# Patient Record
Sex: Female | Born: 1970 | Race: White | Hispanic: No | Marital: Married | Smoking: Never smoker
Health system: Southern US, Community
[De-identification: ages and names within clinical notes are randomized; demographics above are authoritative.]

## PROBLEM LIST (undated history)

## (undated) DIAGNOSIS — T7840XA Allergy, unspecified, initial encounter: Secondary | ICD-10-CM

## (undated) DIAGNOSIS — D219 Benign neoplasm of connective and other soft tissue, unspecified: Secondary | ICD-10-CM

## (undated) DIAGNOSIS — R112 Nausea with vomiting, unspecified: Secondary | ICD-10-CM

## (undated) DIAGNOSIS — T4145XA Adverse effect of unspecified anesthetic, initial encounter: Secondary | ICD-10-CM

## (undated) DIAGNOSIS — N83209 Unspecified ovarian cyst, unspecified side: Secondary | ICD-10-CM

## (undated) DIAGNOSIS — Z9889 Other specified postprocedural states: Secondary | ICD-10-CM

## (undated) DIAGNOSIS — I10 Essential (primary) hypertension: Secondary | ICD-10-CM

## (undated) DIAGNOSIS — T8859XA Other complications of anesthesia, initial encounter: Secondary | ICD-10-CM

## (undated) DIAGNOSIS — K760 Fatty (change of) liver, not elsewhere classified: Secondary | ICD-10-CM

## (undated) DIAGNOSIS — Q7649 Other congenital malformations of spine, not associated with scoliosis: Secondary | ICD-10-CM

## (undated) DIAGNOSIS — K219 Gastro-esophageal reflux disease without esophagitis: Secondary | ICD-10-CM

## (undated) HISTORY — DX: Unspecified ovarian cyst, unspecified side: N83.209

## (undated) HISTORY — DX: Benign neoplasm of connective and other soft tissue, unspecified: D21.9

## (undated) HISTORY — DX: Other congenital malformations of spine, not associated with scoliosis: Q76.49

## (undated) HISTORY — PX: NASAL SINUS SURGERY: SHX719

## (undated) HISTORY — DX: Allergy, unspecified, initial encounter: T78.40XA

## (undated) HISTORY — PX: CERVICAL POLYPECTOMY: SHX88

## (undated) HISTORY — DX: Fatty (change of) liver, not elsewhere classified: K76.0

## (undated) HISTORY — PX: CHOLECYSTECTOMY: SHX55

## (undated) HISTORY — PX: SPINE SURGERY: SHX786

## (undated) HISTORY — PX: TONSILLECTOMY: SUR1361

## (undated) HISTORY — DX: Essential (primary) hypertension: I10

---
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
## 2005-06-13 ENCOUNTER — Emergency Department: Payer: Self-pay | Admitting: Unknown Physician Specialty

## 2008-10-23 ENCOUNTER — Ambulatory Visit: Payer: Self-pay | Admitting: Emergency Medicine

## 2010-02-26 ENCOUNTER — Ambulatory Visit: Payer: Self-pay | Admitting: Family Medicine

## 2010-06-24 ENCOUNTER — Ambulatory Visit: Payer: Self-pay | Admitting: Family Medicine

## 2011-03-11 ENCOUNTER — Ambulatory Visit: Payer: Self-pay | Admitting: Nephrology

## 2011-03-13 ENCOUNTER — Ambulatory Visit: Payer: Self-pay | Admitting: Nephrology

## 2011-10-09 ENCOUNTER — Ambulatory Visit: Payer: Self-pay

## 2011-10-14 LAB — HM PAP SMEAR: HM Pap smear: NEGATIVE

## 2013-03-03 ENCOUNTER — Ambulatory Visit: Payer: Self-pay

## 2013-03-08 ENCOUNTER — Ambulatory Visit: Payer: Self-pay | Admitting: Gastroenterology

## 2013-03-08 LAB — CBC WITH DIFFERENTIAL/PLATELET
Basophil #: 0.1 10*3/uL (ref 0.0–0.1)
Basophil %: 0.6 %
Eosinophil %: 1.4 %
Lymphocyte #: 2.5 10*3/uL (ref 1.0–3.6)
Lymphocyte %: 19.7 %
MCH: 29.2 pg (ref 26.0–34.0)
MCHC: 33.7 g/dL (ref 32.0–36.0)
MCV: 87 fL (ref 80–100)
Monocyte %: 6.8 %
RBC: 4.31 10*6/uL (ref 3.80–5.20)
RDW: 13.3 % (ref 11.5–14.5)
WBC: 12.5 10*3/uL — ABNORMAL HIGH (ref 3.6–11.0)

## 2013-03-08 LAB — HEPATIC FUNCTION PANEL A (ARMC)
Albumin: 3.8 g/dL (ref 3.4–5.0)
Alkaline Phosphatase: 107 U/L (ref 50–136)
Bilirubin, Direct: 0.1 mg/dL (ref 0.00–0.20)
Bilirubin,Total: 0.5 mg/dL (ref 0.2–1.0)
SGPT (ALT): 24 U/L (ref 12–78)
Total Protein: 7.8 g/dL (ref 6.4–8.2)

## 2013-03-08 LAB — CREATININE, SERUM
Creatinine: 0.99 mg/dL (ref 0.60–1.30)
EGFR (African American): >60

## 2013-03-09 ENCOUNTER — Emergency Department: Payer: Self-pay | Admitting: Emergency Medicine

## 2013-03-09 LAB — COMPREHENSIVE METABOLIC PANEL
Albumin: 3.7 g/dL (ref 3.4–5.0)
BUN: 10 mg/dL (ref 7–18)
Bilirubin,Total: 0.4 mg/dL (ref 0.2–1.0)
Chloride: 105 mmol/L (ref 98–107)
Creatinine: 0.92 mg/dL (ref 0.60–1.30)
EGFR (African American): >60
EGFR (Non-African Amer.): >60
Osmolality: 280 (ref 275–301)
Potassium: 3.7 mmol/L (ref 3.5–5.1)
SGOT(AST): 30 U/L (ref 15–37)
Sodium: 140 mmol/L (ref 136–145)
Total Protein: 7.2 g/dL (ref 6.4–8.2)

## 2013-03-09 LAB — CBC
HCT: 38.7 % (ref 35.0–47.0)
HGB: 13.1 g/dL (ref 12.0–16.0)
MCV: 88 fL (ref 80–100)
Platelet: 439 10*3/uL (ref 150–440)
RDW: 13 % (ref 11.5–14.5)

## 2013-03-09 LAB — URINALYSIS, COMPLETE
Bilirubin,UR: NEGATIVE
Glucose,UR: NEGATIVE mg/dL (ref 0–75)
Hyaline Cast: 3
Ketone: NEGATIVE
Protein: 30
RBC,UR: 5 /HPF (ref 0–5)
Specific Gravity: 1.021 (ref 1.003–1.030)
Squamous Epithelial: 12
WBC UR: 9 /HPF (ref 0–5)

## 2014-03-22 LAB — LIPID PANEL
Cholesterol: 174 mg/dL (ref 0–200)
HDL: 46 mg/dL (ref 35–70)
LDL Cholesterol: 97 mg/dL
Triglycerides: 153 mg/dL (ref 40–160)

## 2014-03-22 LAB — HEPATIC FUNCTION PANEL
ALT: 12 U/L (ref 7–35)
AST: 17 U/L (ref 13–35)
Alkaline Phosphatase: 92 U/L (ref 25–125)
Bilirubin, Total: 0.3 mg/dL

## 2014-03-22 LAB — CBC AND DIFFERENTIAL
HCT: 38 % (ref 36–46)
Hemoglobin: 12.3 g/dL (ref 12.0–16.0)
PLATELETS: 364 10*3/uL (ref 150–399)
WBC: 11.2 10^3/mL

## 2014-03-22 LAB — BASIC METABOLIC PANEL
BUN: 9 mg/dL (ref 4–21)
CREATININE: 0.9 mg/dL (ref 0.5–1.1)
Glucose: 89 mg/dL
Potassium: 4.8 mmol/L (ref 3.4–5.3)
Sodium: 135 mmol/L — AB (ref 137–147)

## 2014-03-22 LAB — TSH: TSH: 2.3 u[IU]/mL (ref 0.41–5.90)

## 2014-04-11 ENCOUNTER — Ambulatory Visit: Payer: Self-pay

## 2014-04-11 LAB — HM MAMMOGRAPHY

## 2015-03-09 ENCOUNTER — Encounter: Payer: Self-pay | Admitting: Family Medicine

## 2015-03-09 ENCOUNTER — Ambulatory Visit (INDEPENDENT_AMBULATORY_CARE_PROVIDER_SITE_OTHER): Payer: Self-pay | Admitting: Family Medicine

## 2015-03-09 VITALS — BP 110/71 | HR 56 | Temp 98.0°F | Ht 63.3 in | Wt 211.2 lb

## 2015-03-09 DIAGNOSIS — E669 Obesity, unspecified: Secondary | ICD-10-CM

## 2015-03-09 DIAGNOSIS — N951 Menopausal and female climacteric states: Secondary | ICD-10-CM

## 2015-03-09 DIAGNOSIS — I10 Essential (primary) hypertension: Secondary | ICD-10-CM

## 2015-03-09 MED ORDER — ATENOLOL 25 MG PO TABS: 25.0000 mg | ORAL_TABLET | Freq: Every day | ORAL | Status: DC

## 2015-03-09 MED ORDER — OLMESARTAN MEDOXOMIL 40 MG PO TABS: 40.0000 mg | ORAL_TABLET | Freq: Every day | ORAL | Status: DC

## 2015-03-09 MED ORDER — AMLODIPINE BESYLATE 5 MG PO TABS: 5.0000 mg | ORAL_TABLET | Freq: Every day | ORAL | Status: DC

## 2015-03-09 NOTE — Patient Instructions (Signed)

## 2015-03-09 NOTE — Assessment & Plan Note (Signed)
Continue great diet and exercise and weight loss! Doing a great job!

## 2015-03-09 NOTE — Assessment & Plan Note (Signed)
Doing great! Continue with weight loss and exercise. Continue current regimen at this time. Advised patient to monitor BP more at home and watch for dizziness or lightheadedness as we may be able to decrease her medication. Follow up for PE in 6 months.

## 2015-03-09 NOTE — Progress Notes (Signed)
BP 110/71 mmHg  Pulse 56  Temp(Src) 98 F (36.7 C)  Ht 5' 3.3" (1.608 m)  Wt 211 lb 3.2 oz (95.8 kg)  BMI 37.05 kg/m2  SpO2 98%  LMP 02/23/2015 (Approximate)   Subjective:    Patient ID: Victoria Bowers, female    DOB: [birth year], 44 y.o.   MRN: [id number]  HPI: Victoria Bowers is a 44 y.o. female presenting on 03/09/2015 for Hypertension  Kaylla has been doing really well. She has started doing [employer] and working with a Physiological scientist. She has lost 22lbs! She is feeling better, her headaches are gone. She is much happier. She does note that her period has become a bit more erratic, but she is still getting one monthly. She is otherwise feeling well with no other concerns or complaints today.   HYPERTENSION Hypertension status: controlled Satisfied with current treatment? no Duration of hypertension: chronic BP monitoring frequency:  not checking BP range:  BP medication side effects:  no Medication compliance: excellent compliance Previous BP meds: none Aspirin: no Recurrent headaches: no Visual changes: no Palpitations: no Dyspnea: no Chest pain: no Lower extremity edema: no Dizzy/lightheaded: no  Relevant past medical, surgical, family and social history reviewed and updated as indicated. Interim medical history since our last visit reviewed. Allergies and medications reviewed and updated.  Review of Systems  Constitutional: Negative.   Respiratory: Negative.   Cardiovascular: Negative.   Neurological: Negative.   Psychiatric/Behavioral: Negative.     Per HPI unless specifically indicated above     Objective:    BP 110/71 mmHg  Pulse 56  Temp(Src) 98 F (36.7 C)  Ht 5' 3.3" (1.608 m)  Wt 211 lb 3.2 oz (95.8 kg)  BMI 37.05 kg/m2  SpO2 98%  LMP 02/23/2015 (Approximate)  Wt Readings from Last 3 Encounters:  03/09/15 211 lb 3.2 oz (95.8 kg)  09/01/14 233 lb (105.688 kg)   Physical Exam  Constitutional: She appears well-developed  and well-nourished.  HENT:  Head: Normocephalic and atraumatic.  Eyes: Pupils are equal, round, and reactive to light.  Cardiovascular: Normal rate, regular rhythm and normal heart sounds.  Exam reveals no gallop and no friction rub.   No murmur heard. Pulmonary/Chest: Effort normal and breath sounds normal. No respiratory distress. She has no wheezes. She has no rales. She exhibits no tenderness.  Skin: Skin is warm and dry.  Psychiatric: She has a normal mood and affect. Her behavior is normal. Judgment and thought content normal.  Nursing note and vitals reviewed.       Assessment & Plan:   Problem List Items Addressed This Visit      Cardiovascular and Mediastinum   HTN (hypertension) - Primary    Doing great! Continue with weight loss and exercise. Continue current regimen at this time. Advised patient to monitor BP more at home and watch for dizziness or lightheadedness as we may be able to decrease her medication. Follow up for PE in 6 months.       Relevant Medications   atenolol (TENORMIN) 25 MG tablet   amLODipine (NORVASC) 5 MG tablet   olmesartan (BENICAR) 40 MG tablet   Other Relevant Orders   Basic Metabolic Panel (BMET)     Other   Obesity    Continue great diet and exercise and weight loss! Doing a great job!       Other Visit Diagnoses    Perimenopausal        Information given to patient  today regarding perimenopausal. Continue to monitor.         Follow up plan: Return in about 6 months (around 09/08/2015), or PE with CW.

## 2015-03-10 LAB — BASIC METABOLIC PANEL
BUN / CREAT RATIO: 11 (ref 9–23)
BUN: 10 mg/dL (ref 6–24)
CO2: 20 mmol/L (ref 18–29)
Calcium: 9.3 mg/dL (ref 8.7–10.2)
Chloride: 101 mmol/L (ref 97–108)
Creatinine, Ser: 0.93 mg/dL (ref 0.57–1.00)
GFR calc non Af Amer: 74 mL/min/{1.73_m2} (ref >59)
GFR, EST AFRICAN AMERICAN: 86 mL/min/{1.73_m2} (ref >59)
Glucose: 100 mg/dL — ABNORMAL HIGH (ref 65–99)
Potassium: 4.6 mmol/L (ref 3.5–5.2)
Sodium: 136 mmol/L (ref 134–144)

## 2015-07-09 ENCOUNTER — Telehealth: Payer: Self-pay | Admitting: Unknown Physician Specialty

## 2015-07-12 ENCOUNTER — Telehealth: Payer: Self-pay | Admitting: Unknown Physician Specialty

## 2015-07-12 NOTE — Telephone Encounter (Signed)
Pt needs a prior auth for benicar

## 2015-07-13 NOTE — Telephone Encounter (Signed)
I called and requested the paperwork for the beniciar and spoke with Malachy Mood and she stated to have the patient come in for a face to face appt regarding this particular medication. Colletta Maryland called and scheduled her for an appt next week'

## 2015-07-16 DIAGNOSIS — K760 Fatty (change of) liver, not elsewhere classified: Secondary | ICD-10-CM | POA: Insufficient documentation

## 2015-07-16 DIAGNOSIS — G43909 Migraine, unspecified, not intractable, without status migrainosus: Secondary | ICD-10-CM

## 2015-07-17 ENCOUNTER — Encounter: Payer: Self-pay | Admitting: Unknown Physician Specialty

## 2015-07-17 ENCOUNTER — Ambulatory Visit (INDEPENDENT_AMBULATORY_CARE_PROVIDER_SITE_OTHER): Payer: BLUE CROSS/BLUE SHIELD | Admitting: Unknown Physician Specialty

## 2015-07-17 VITALS — BP 141/82 | HR 67 | Temp 98.6°F | Ht 62.2 in | Wt 217.8 lb

## 2015-07-17 DIAGNOSIS — Z23 Encounter for immunization: Secondary | ICD-10-CM | POA: Diagnosis not present

## 2015-07-17 DIAGNOSIS — I10 Essential (primary) hypertension: Secondary | ICD-10-CM | POA: Diagnosis not present

## 2015-07-17 MED ORDER — OLMESARTAN MEDOXOMIL 40 MG PO TABS: 40.0000 mg | ORAL_TABLET | Freq: Every day | ORAL | Status: DC

## 2015-07-17 NOTE — Assessment & Plan Note (Addendum)
BP is stable.  Paperwork filled out and faxed.  Will do labs at f/u in December

## 2015-07-17 NOTE — Progress Notes (Signed)
   BP 141/82 mmHg  Pulse 67  Temp(Src) 98.6 F (37 C)  Ht 5' 2.2" (1.58 m)  Wt 217 lb 12.8 oz (98.793 kg)  BMI 39.57 kg/m2  SpO2 99%  LMP 06/26/2015 (Approximate)   Subjective:    Patient ID: Victoria Bowers, female    DOB: [birth year], 44 y.o.   MRN: [id number]  HPI: Victoria Bowers is a 44 y.o. female  Chief Complaint  Patient presents with  . Hypertension   Hypertension This is a chronic (Pt needs approval for Benicar.  She has been on Avelox 300 mg and Benazepril 40 mg without adequate response) problem. The problem is unchanged. The problem is controlled. Pertinent negatives include no chest pain, headaches, neck pain, palpitations, peripheral edema or shortness of breath. There are no compliance problems.    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 Relevant past medical, surgical, family and social history reviewed and updated as indicated. Interim medical history since our last visit reviewed. Allergies and medications reviewed and updated.  Review of Systems  Respiratory: Negative for shortness of breath.   Cardiovascular: Negative for chest pain and palpitations.  Musculoskeletal: Negative for neck pain.  Neurological: Negative for headaches.    Per HPI unless specifically indicated above     Objective:    BP 141/82 mmHg  Pulse 67  Temp(Src) 98.6 F (37 C)  Ht 5' 2.2" (1.58 m)  Wt 217 lb 12.8 oz (98.793 kg)  BMI 39.57 kg/m2  SpO2 99%  LMP 06/26/2015 (Approximate)  Wt Readings from Last 3 Encounters:  07/17/15 217 lb 12.8 oz (98.793 kg)  09/04/14 233 lb (105.688 kg)  03/09/15 211 lb 3.2 oz (95.8 kg)    Physical Exam  Constitutional: She is oriented to person, place, and time. She appears well-developed and well-nourished. No distress.  HENT:  Head: Normocephalic and atraumatic.  Eyes: Conjunctivae and lids are normal. Right eye exhibits no discharge. Left eye exhibits no discharge. No scleral icterus.  Cardiovascular: Normal rate, regular  rhythm and normal heart sounds.  Exam reveals no gallop and no friction rub.   No murmur heard. Pulmonary/Chest: Effort normal and breath sounds normal. No respiratory distress. She has no wheezes.  Abdominal: Normal appearance. There is no splenomegaly or hepatomegaly.  Musculoskeletal: Normal range of motion.  Neurological: She is alert and oriented to person, place, and time.  Skin: Skin is intact. No rash noted. No pallor.  Psychiatric: She has a normal mood and affect. Her behavior is normal. Judgment and thought content normal.     Assessment & Plan:   Problem List Items Addressed This Visit      Unprioritized   HTN (hypertension) - Primary    BP is stable.  Paperwork filled out and faxed.  Will do labs at f/u in December      Relevant Medications   olmesartan (BENICAR) 40 MG tablet   olmesartan (BENICAR) 40 MG tablet    Other Visit Diagnoses    Immunization due        Relevant Orders    Flu Vaccine QUAD 36+ mos PF IM (Fluarix & Fluzone Quad PF) (Completed)       Will do appropriate labs in December when patient comes in for her physical.  Rx for 7 days of Benicar and one for 30 days due to cost.    Follow up plan: Return in about 2 months (around 09/16/2015) for physical.

## 2015-07-20 NOTE — Telephone Encounter (Signed)
done

## 2015-07-24 ENCOUNTER — Encounter: Payer: Self-pay | Admitting: Unknown Physician Specialty

## 2015-07-25 ENCOUNTER — Other Ambulatory Visit: Payer: Self-pay | Admitting: Unknown Physician Specialty

## 2015-07-26 ENCOUNTER — Other Ambulatory Visit: Payer: Self-pay | Admitting: Unknown Physician Specialty

## 2015-07-27 NOTE — Telephone Encounter (Signed)
Will work on it.  Thanks . 

## 2015-08-20 ENCOUNTER — Other Ambulatory Visit: Payer: Self-pay

## 2015-08-20 MED ORDER — ATENOLOL 25 MG PO TABS: 25.0000 mg | ORAL_TABLET | Freq: Every day | ORAL | Status: DC

## 2015-08-20 NOTE — Telephone Encounter (Signed)
LAST VISIT: 07/17/2015 UPCOMING APPT: 09/10/2015  Refill and 90 day supply request for atenolol 25 mg tab.  [facility] [place]

## 2015-09-10 ENCOUNTER — Encounter: Payer: Self-pay | Admitting: Unknown Physician Specialty

## 2015-09-10 ENCOUNTER — Ambulatory Visit (INDEPENDENT_AMBULATORY_CARE_PROVIDER_SITE_OTHER): Payer: BLUE CROSS/BLUE SHIELD | Admitting: Unknown Physician Specialty

## 2015-09-10 VITALS — BP 127/85 | HR 60 | Temp 98.3°F | Ht 62.6 in | Wt 221.0 lb

## 2015-09-10 DIAGNOSIS — Z0000 Encounter for general adult medical examination without abnormal findings: Secondary | ICD-10-CM | POA: Diagnosis not present

## 2015-09-10 DIAGNOSIS — K760 Fatty (change of) liver, not elsewhere classified: Secondary | ICD-10-CM

## 2015-09-10 DIAGNOSIS — E669 Obesity, unspecified: Secondary | ICD-10-CM

## 2015-09-10 DIAGNOSIS — I10 Essential (primary) hypertension: Secondary | ICD-10-CM

## 2015-09-10 LAB — MICROSCOPIC EXAMINATION

## 2015-09-10 LAB — MICROALBUMIN, URINE WAIVED
CREATININE, URINE WAIVED: 50 mg/dL (ref 10–300)
MICROALB, UR WAIVED: 10 mg/L (ref 0–19)

## 2015-09-10 MED ORDER — OLMESARTAN MEDOXOMIL 40 MG PO TABS: 40.0000 mg | ORAL_TABLET | Freq: Every day | ORAL | Status: DC

## 2015-09-10 MED ORDER — ATENOLOL 25 MG PO TABS: 25.0000 mg | ORAL_TABLET | Freq: Every day | ORAL | Status: DC

## 2015-09-10 MED ORDER — AMLODIPINE BESYLATE 5 MG PO TABS: 5.0000 mg | ORAL_TABLET | Freq: Every day | ORAL | Status: DC

## 2015-09-10 NOTE — Assessment & Plan Note (Signed)
She is exercising and now wants to see a nutritionist

## 2015-09-10 NOTE — Progress Notes (Signed)
   BP 127/85 mmHg  Pulse 60  Temp(Src) 98.3 F (36.8 C)  Ht 5' 2.6" (1.59 m)  Wt 221 lb (100.245 kg)  BMI 39.65 kg/m2  SpO2 96%  LMP 09/06/2015 (Exact Date)   Subjective:    Patient ID: [name], female    DOB: [birth year], 44 y.o.   MRN: [id number]  HPI: Chasmine Neef is a 44 y.o. female  Chief Complaint  Patient presents with  . Annual Exam   Hypertension Using medications without difficulty Average home BPs 135/80's  No problems or lightheadedness No chest pain with exertion or shortness of breath No Edema  Obesity Pt is exercising doing karate.  She would like to see a nutritionist to learn better eating habits.  ------  Relevant past medical, surgical, family and social history reviewed and updated as indicated. Interim medical history since our last visit reviewed. Allergies and medications reviewed and updated.  Review of Systems  Per HPI unless specifically indicated above     Objective:    BP 127/85 mmHg  Pulse 60  Temp(Src) 98.3 F (36.8 C)  Ht 5' 2.6" (1.59 m)  Wt 221 lb (100.245 kg)  BMI 39.65 kg/m2  SpO2 96%  LMP 09/06/2015 (Exact Date)  Wt Readings from Last 3 Encounters:  09/10/15 221 lb (100.245 kg)  07/17/15 217 lb 12.8 oz (98.793 kg)  09/04/14 233 lb (105.688 kg)    Physical Exam  Results for orders placed or performed in visit on 07/16/15  HM MAMMOGRAPHY  Result Value Ref Range   HM Mammogram from [facility]       Assessment & Plan:   Problem List Items Addressed This Visit      Unprioritized   HTN (hypertension)   Relevant Orders   Comprehensive metabolic panel   Lipid Panel w/o Chol/HDL Ratio   Microalbumin, Urine Waived   Uric acid   Obesity - Primary    She is exercising and now wants to see a nutritionist      Relevant Orders   Ambulatory referral to Nutrition and Diabetic Education   Fatty liver   Relevant Orders   Lipid Panel w/o Chol/HDL Ratio    Other Visit Diagnoses    Routine general medical  examination at a health care facility        Relevant Orders    CBC with Differential/Platelet    Comprehensive metabolic panel    HIV antibody    TSH    Lipid Panel w/o Chol/HDL Ratio    UA/M w/rflx Culture, Routine    MM DIGITAL SCREENING BILATERAL    IGP, Aptima HPV, rfx 16/18,45        Follow up plan: No Follow-up on file.

## 2015-09-11 LAB — URIC ACID: URIC ACID: 3.9 mg/dL (ref 2.5–7.1)

## 2015-09-11 LAB — LIPID PANEL W/O CHOL/HDL RATIO
Cholesterol, Total: 181 mg/dL (ref 100–199)
HDL: 54 mg/dL (ref >39)
LDL CALC: 107 mg/dL — AB (ref 0–99)
Triglycerides: 100 mg/dL (ref 0–149)
VLDL Cholesterol Cal: 20 mg/dL (ref 5–40)

## 2015-09-11 LAB — CBC WITH DIFFERENTIAL/PLATELET
BASOS: 1 %
Basophils Absolute: 0 10*3/uL (ref 0.0–0.2)
EOS (ABSOLUTE): 0.2 10*3/uL (ref 0.0–0.4)
Eos: 3 %
HEMOGLOBIN: 12.6 g/dL (ref 11.1–15.9)
Hematocrit: 37 % (ref 34.0–46.6)
IMMATURE GRANS (ABS): 0 10*3/uL (ref 0.0–0.1)
Immature Granulocytes: 0 %
LYMPHS ABS: 2.2 10*3/uL (ref 0.7–3.1)
LYMPHS: 29 %
MCH: 30.1 pg (ref 26.6–33.0)
MCHC: 34.1 g/dL (ref 31.5–35.7)
MCV: 89 fL (ref 79–97)
MONOCYTES: 6 %
Monocytes Absolute: 0.5 10*3/uL (ref 0.1–0.9)
NEUTROS ABS: 4.6 10*3/uL (ref 1.4–7.0)
Neutrophils: 61 %
Platelets: 374 10*3/uL (ref 150–379)
RBC: 4.18 x10E6/uL (ref 3.77–5.28)
RDW: 12.8 % (ref 12.3–15.4)
WBC: 7.6 10*3/uL (ref 3.4–10.8)

## 2015-09-11 LAB — HIV ANTIBODY (ROUTINE TESTING W REFLEX): HIV SCREEN 4TH GENERATION: NONREACTIVE

## 2015-09-11 LAB — COMPREHENSIVE METABOLIC PANEL
A/G RATIO: 1.7 (ref 1.1–2.5)
ALBUMIN: 4 g/dL (ref 3.5–5.5)
ALT: 17 IU/L (ref 0–32)
AST: 22 IU/L (ref 0–40)
Alkaline Phosphatase: 78 IU/L (ref 39–117)
BILIRUBIN TOTAL: 0.3 mg/dL (ref 0.0–1.2)
BUN / CREAT RATIO: 19 (ref 9–23)
BUN: 15 mg/dL (ref 6–24)
CHLORIDE: 97 mmol/L (ref 96–106)
CO2: 22 mmol/L (ref 18–29)
Calcium: 9.1 mg/dL (ref 8.7–10.2)
Creatinine, Ser: 0.8 mg/dL (ref 0.57–1.00)
GFR calc non Af Amer: 89 mL/min/{1.73_m2} (ref >59)
GFR, EST AFRICAN AMERICAN: 102 mL/min/{1.73_m2} (ref >59)
Globulin, Total: 2.4 g/dL (ref 1.5–4.5)
Glucose: 101 mg/dL — ABNORMAL HIGH (ref 65–99)
POTASSIUM: 4.6 mmol/L (ref 3.5–5.2)
SODIUM: 132 mmol/L — AB (ref 134–144)
TOTAL PROTEIN: 6.4 g/dL (ref 6.0–8.5)

## 2015-09-11 LAB — UA/M W/RFLX CULTURE, ROUTINE

## 2015-09-11 LAB — TSH: TSH: 2.5 u[IU]/mL (ref 0.450–4.500)

## 2015-09-15 ENCOUNTER — Encounter: Payer: Self-pay | Admitting: Family Medicine

## 2015-09-15 LAB — IGP, APTIMA HPV, RFX 16/18,45
HPV APTIMA: NEGATIVE
PAP Smear Comment: 0

## 2015-09-26 ENCOUNTER — Encounter: Payer: BLUE CROSS/BLUE SHIELD | Attending: Unknown Physician Specialty | Admitting: Dietician

## 2015-09-26 VITALS — Ht 63.0 in | Wt 221.0 lb

## 2015-09-26 DIAGNOSIS — E669 Obesity, unspecified: Secondary | ICD-10-CM | POA: Diagnosis not present

## 2015-09-26 DIAGNOSIS — K760 Fatty (change of) liver, not elsewhere classified: Secondary | ICD-10-CM

## 2015-09-26 DIAGNOSIS — I10 Essential (primary) hypertension: Secondary | ICD-10-CM

## 2015-09-26 NOTE — Patient Instructions (Signed)
   Control portions of starchy foods, keep to 1/2 cup to no more than 1 cup per meal.   Eat out less often -- plan ahead to have healthy options at home.   Try having a small snack or light meal before karate on Tuesdays and Thursdays, then a healthy snack when you get home such as fruit and nuts or lowfat cheese, with milk or yogurt. Can also include a vegetable such as cucumbers, or carrot or celery.

## 2015-09-26 NOTE — Progress Notes (Signed)
Medical Nutrition Therapy: Visit start time: 1630  end time: 1730  Assessment:  Diagnosis: obesity Past medical history: HTN, fatty liver Psychosocial issues/ stress concerns: none Preferred learning method:  Victoria Bowers   Current weight: 221lbs with shoes and sweater  Height: 5'3" Medications, supplements: reviewed list in chart with patient  Progress and evaluation: Patient reports having lost about 20lbs in the past year while taking karate classes, now wants to work on diet for further weight loss.        She is ready for lifestyle change, does not want temporary diet.         She reports generally unhealthy eating habits as a child.         She tried weight watchers in the past for weight loss, with only short-term success. She attributes most weight gain to back injury, decreased mobility several years ago.   Physical activity: karate 1-2 hours 3 days per week  Dietary Intake:  Usual eating pattern includes 2-3 meals and 1-2 snacks per day. Dining out frequency: 8-11 meals per week.  Breakfast: usually light meal i.e. hash browns or tater tots. water Snack: chips provided by work, or goldfish, nuts  Lunch: often brings from home. Last week shredded beef, cheesy potatoes. Some lean cuisines Snack: sometimes, not regularly; similar to am Supper: leftovers, sometimes takeout [place] or other. Eats late on Tuesdays and Thursdays after karate (9:30-10pm) Snack: usually none Beverages: water, occasional soda at work, but does not keep in her home. Rarely Ginger ale.  Nutrition Care Education: Topics covered: weight control Basic nutrition: basic food groups, appropriate nutrient balance, appropriate meal and snack schedule, general nutrition guidelines    Weight control: behavioral changes for weight loss: portion control, tracking food intake, increasing vegetables and fruits; 1400kcal meal plan Hypertension:  identifying high sodium foods  Nutritional Diagnosis:  [place]-3.3  Overweight/obesity As related to history of restricted activity, excess caloric intake.  As evidenced by patient report, high BMI.  Intervention: Instruction as noted above.   Set goals with patient input.   Encouraged her to consider some form of tracking intake.    Education Materials given:  . Food lists/ Planning A Balanced Meal . Sample meal pattern/ menus: Quick and Healthy Meal Ideas . Snacking handout: Smart Snacking . Goals/ instructions   Learner/ who was taught:  . Patient   Level of understanding: Marland Kitchen Verbalizes/ demonstrates competency  Demonstrated degree of understanding via:   Teach back Learning barriers: . None   Willingness to learn/ readiness for change: . Eager, change in progress  Monitoring and Evaluation:  Dietary intake, exercise, and body weight      follow up: 10/24/15

## 2015-10-24 ENCOUNTER — Ambulatory Visit: Payer: BLUE CROSS/BLUE SHIELD | Admitting: Dietician

## 2015-11-02 ENCOUNTER — Encounter: Payer: BLUE CROSS/BLUE SHIELD | Attending: Unknown Physician Specialty | Admitting: Dietician

## 2015-11-02 VITALS — Ht 63.0 in | Wt 214.4 lb

## 2015-11-02 DIAGNOSIS — I10 Essential (primary) hypertension: Secondary | ICD-10-CM

## 2015-11-02 DIAGNOSIS — K760 Fatty (change of) liver, not elsewhere classified: Secondary | ICD-10-CM

## 2015-11-02 DIAGNOSIS — E669 Obesity, unspecified: Secondary | ICD-10-CM | POA: Diagnosis not present

## 2015-11-02 NOTE — Progress Notes (Signed)
Medical Nutrition Therapy: Visit start time: 1300  end time: 1330  Assessment:  Diagnosis: obesity Medical history changes: no changes Psychosocial issues/ stress concerns: none  Current weight: 214.4lbs  Height: 5'3" Medications, supplement changes: no changes Progress and evaluation: Patient reports eating smaller portions, has changed dinner schedule on karate-days, eating light meal before karate, then is not hungry when she arrives home.        She reports eating more vegetables and fruits but feels she still needs to increase.        She is eating out less often, packing lunches and snacks daily. Measuring snack portions.          She voices some challenge in making changes and adjusting initially, but feels the past 2 weeks have gone well.   Physical activity: karate 1-2 hours 3 times per week  Dietary Intake:  Usual eating pattern includes 3 meals and 1-2 snacks per day. Dining out frequency: not assess today.  Breakfast: carnation breakfast drink with banana and 1c. Milk and ice; or 1/2 bagel and fruit.   Snack: pre-portioned nuts, vanilla wafers, pretzels, or cheez-its.  Lunch: chicken/ leftovers from home, or lean cuisine or sandwich and salad such as fajita tortilla with lunch meat and [place] dressing or tsaziki sauce.   Limits meal to 3 carb servings.  Snack: sometimes, same options as am. Supper: chicken, tacos, vegetables such as broccoli, zucchini, salads.  Snack: none Beverages: water or milk. 0-1 soda weekly occasional hot tea.   Nutrition Care Education: Topics covered: weight management Weight control: increasing vegetable and fruit intake, options for whole grain foods such as cereals.  Nutritional Diagnosis:  [place]-3.3 Overweight/obesity As related to history of excess caloric intake and inactivity.  As evidenced by patient report.  Intervention: Discussion as noted above.   Commended patient for changes made.    Scheduled one more follow-up in about 6 weeks.    Education Materials given:  Marland Kitchen Ways to Increase Vegetables and Fruits . How to Eat More Beans . Build a Pyramid [employer] . Goals/ instructions  Learner/ who was taught:  . Patient   Level of understanding: Marland Kitchen Verbalizes/ demonstrates competency  Demonstrated degree of understanding via:   Teach back Learning barriers: . None  Willingness to learn/ readiness for change: . Eager, change in progress  Monitoring and Evaluation:  Dietary intake, exercise, and body weight      follow up: 12/19/15

## 2015-11-02 NOTE — Patient Instructions (Signed)
   Keep up your great healthy changes to control food portions and make good choices.   Eat generous portions of vegetables, and find ways to add them into foods like pasta to stretch portions of starches or meats.

## 2015-11-19 ENCOUNTER — Other Ambulatory Visit: Payer: Self-pay | Admitting: Family Medicine

## 2015-11-19 NOTE — Telephone Encounter (Signed)
Your patient 

## 2015-11-28 ENCOUNTER — Encounter: Payer: Self-pay | Admitting: Unknown Physician Specialty

## 2015-11-28 ENCOUNTER — Ambulatory Visit (INDEPENDENT_AMBULATORY_CARE_PROVIDER_SITE_OTHER): Payer: BLUE CROSS/BLUE SHIELD | Admitting: Unknown Physician Specialty

## 2015-11-28 VITALS — BP 138/82 | HR 55 | Temp 98.4°F | Ht 62.0 in | Wt 217.8 lb

## 2015-11-28 DIAGNOSIS — M659 Synovitis and tenosynovitis, unspecified: Secondary | ICD-10-CM

## 2015-11-28 DIAGNOSIS — M778 Other enthesopathies, not elsewhere classified: Secondary | ICD-10-CM

## 2015-11-28 MED ORDER — DICLOFENAC SODIUM 1 % TD GEL: 4.0000 g | Freq: Four times a day (QID) | TRANSDERMAL | Status: DC

## 2015-11-28 NOTE — Assessment & Plan Note (Signed)
Use tennis elbow brace.  Avoid repetitive bending or twisting motions.  Rx given for Voltaren gel.

## 2015-11-28 NOTE — Progress Notes (Signed)
BP 138/82 mmHg  Pulse 55  Temp(Src) 98.4 F (36.9 C)  Ht 5\' 2"  (1.575 m)  Wt 217 lb 12.8 oz (98.793 kg)  BMI 39.83 kg/m2  SpO2 99%  LMP 11/20/2015 (Exact Date)   Subjective:    Patient ID: Victoria Bowers, female    DOB: [birth year], 45 y.o.   MRN: [id number]  HPI: Victoria Bowers is a 44 y.o. female  Chief Complaint  Patient presents with  . Elbow Pain    pt states she thinks she has tennis elbow in her left elbow. States it has been hurting for a while but has hurt very bad within the last week.    Tendonitis of left elbow Painful in the last week.  Has been doing karate and getting tested for a new belt class.  She is taking 1200 mg's of Ibuprofen and wearing a compression brace.  She has not had any improvement from above treatments.    Relevant past medical, surgical, family and social history reviewed and updated as indicated. Interim medical history since our last visit reviewed. Allergies and medications reviewed and updated.  Review of Systems  Per HPI unless specifically indicated above     Objective:    BP 138/82 mmHg  Pulse 55  Temp(Src) 98.4 F (36.9 C)  Ht 5\' 2"  (1.575 m)  Wt 217 lb 12.8 oz (98.793 kg)  BMI 39.83 kg/m2  SpO2 99%  LMP 11/20/2015 (Exact Date)  Wt Readings from Last 3 Encounters:  11/28/15 217 lb 12.8 oz (98.793 kg)  11/02/15 214 lb 6.4 oz (97.251 kg)  09/26/15 221 lb (100.245 kg)    Physical Exam  Constitutional: She is oriented to person, place, and time. She appears well-developed and well-nourished. No distress.  HENT:  Head: Normocephalic and atraumatic.  Eyes: Conjunctivae and lids are normal. Right eye exhibits no discharge. Left eye exhibits no discharge. No scleral icterus.  Cardiovascular: Normal rate.   Pulmonary/Chest: Effort normal.  Abdominal: Normal appearance. There is no splenomegaly or hepatomegaly.  Musculoskeletal: Normal range of motion.       Left elbow: She exhibits normal range of motion, no  swelling, no effusion, no deformity and no laceration. Tenderness found. Lateral epicondyle tenderness noted. No medial epicondyle and no olecranon process tenderness noted.  Neurological: She is alert and oriented to person, place, and time.  Skin: Skin is intact. No rash noted. No pallor.  Psychiatric: She has a normal mood and affect. Her behavior is normal. Judgment and thought content normal.    Results for orders placed or performed in visit on 09/10/15  Microscopic Examination  Result Value Ref Range   WBC, UA 0-5 0 -  5 /hpf   RBC, UA 3-10 (A) 0 -  2 /hpf   Epithelial Cells (non renal) 0-10 0 - 10 /hpf   Bacteria, UA Few None seen/Few  CBC with Differential/Platelet  Result Value Ref Range   WBC 7.6 3.4 - 10.8 x10E3/uL   RBC 4.18 3.77 - 5.28 x10E6/uL   Hemoglobin 12.6 11.1 - 15.9 g/dL   Hematocrit 37.0 34.0 - 46.6 %   MCV 89 79 - 97 fL   MCH 30.1 26.6 - 33.0 pg   MCHC 34.1 31.5 - 35.7 g/dL   RDW 12.8 12.3 - 15.4 %   Platelets 374 150 - 379 x10E3/uL   Neutrophils 61 %   Lymphs 29 %   Monocytes 6 %   Eos 3 %   Basos 1 %  Neutrophils Absolute 4.6 1.4 - 7.0 x10E3/uL   Lymphocytes Absolute 2.2 0.7 - 3.1 x10E3/uL   Monocytes Absolute 0.5 0.1 - 0.9 x10E3/uL   EOS (ABSOLUTE) 0.2 0.0 - 0.4 x10E3/uL   Basophils Absolute 0.0 0.0 - 0.2 x10E3/uL   Immature Granulocytes 0 %   Immature Grans (Abs) 0.0 0.0 - 0.1 x10E3/uL  Comprehensive metabolic panel  Result Value Ref Range   Glucose 101 (H) 65 - 99 mg/dL   BUN 15 6 - 24 mg/dL   Creatinine, Ser 0.80 0.57 - 1.00 mg/dL   GFR calc non Af Amer 89 >59 mL/min/1.73   GFR calc Af Amer 102 >59 mL/min/1.73   BUN/Creatinine Ratio 19 9 - 23   Sodium 132 (L) 134 - 144 mmol/L   Potassium 4.6 3.5 - 5.2 mmol/L   Chloride 97 96 - 106 mmol/L   CO2 22 18 - 29 mmol/L   Calcium 9.1 8.7 - 10.2 mg/dL   Total Protein 6.4 6.0 - 8.5 g/dL   Albumin 4.0 3.5 - 5.5 g/dL   Globulin, Total 2.4 1.5 - 4.5 g/dL   Albumin/Globulin Ratio 1.7 1.1 - 2.5    Bilirubin Total 0.3 0.0 - 1.2 mg/dL   Alkaline Phosphatase 78 39 - 117 IU/L   AST 22 0 - 40 IU/L   ALT 17 0 - 32 IU/L  HIV antibody  Result Value Ref Range   HIV Screen 4th Generation wRfx Non Reactive Non Reactive  TSH  Result Value Ref Range   TSH 2.500 0.450 - 4.500 uIU/mL  Lipid Panel w/o Chol/HDL Ratio  Result Value Ref Range   Cholesterol, Total 181 100 - 199 mg/dL   Triglycerides 100 0 - 149 mg/dL   HDL 54 >39 mg/dL   VLDL Cholesterol Cal 20 5 - 40 mg/dL   LDL Calculated 107 (H) 0 - 99 mg/dL  UA/M w/rflx Culture, Routine  Result Value Ref Range   Urine Culture, Routine Final report    Urine Culture result 1 Comment   Microalbumin, Urine Waived  Result Value Ref Range   Microalb, Ur Waived 10 0 - 19 mg/L   Creatinine, Urine Waived 50 10 - 300 mg/dL   Microalb/Creat Ratio 30-300 (H) <30 mg/g  Uric acid  Result Value Ref Range   Uric Acid 3.9 2.5 - 7.1 mg/dL  IGP, Aptima HPV, rfx 16/18,45  Result Value Ref Range   DIAGNOSIS: Comment    Specimen adequacy: Comment    CLINICIAN PROVIDED ICD10: Comment    Performed by: Comment    PAP SMEAR COMMENT .    Note: Comment    Test Methodology Comment    HPV Aptima Negative Negative      Assessment & Plan:   Problem List Items Addressed This Visit      Unprioritized   Left elbow tendonitis - Primary    Use tennis elbow brace.  Avoid repetitive bending or twisting motions.  Rx given for Voltaren gel.          Follow up plan: Return if symptoms worsen or fail to improve.

## 2015-12-18 ENCOUNTER — Other Ambulatory Visit: Payer: Self-pay | Admitting: Unknown Physician Specialty

## 2015-12-18 NOTE — Telephone Encounter (Signed)
Your patient 

## 2015-12-19 ENCOUNTER — Ambulatory Visit: Payer: BLUE CROSS/BLUE SHIELD | Admitting: Dietician

## 2016-01-23 ENCOUNTER — Encounter: Payer: Self-pay | Admitting: Family Medicine

## 2016-01-23 ENCOUNTER — Ambulatory Visit (INDEPENDENT_AMBULATORY_CARE_PROVIDER_SITE_OTHER): Payer: BLUE CROSS/BLUE SHIELD | Admitting: Family Medicine

## 2016-01-23 VITALS — BP 123/82 | HR 59 | Temp 99.2°F | Wt 220.0 lb

## 2016-01-23 DIAGNOSIS — K122 Cellulitis and abscess of mouth: Secondary | ICD-10-CM | POA: Diagnosis not present

## 2016-01-23 MED ORDER — AMOXICILLIN-POT CLAVULANATE 875-125 MG PO TABS: 1.0000 | ORAL_TABLET | Freq: Two times a day (BID) | ORAL | Status: DC

## 2016-01-23 MED ORDER — MAGIC MOUTHWASH W/LIDOCAINE: 5.0000 mL | Freq: Four times a day (QID) | ORAL | Status: DC

## 2016-01-23 NOTE — Progress Notes (Signed)
BP 123/82 mmHg  Pulse 59  Temp(Src) 99.2 F (37.3 C)  Wt 220 lb (99.791 kg)  SpO2 98%   Subjective:    Patient ID: Victoria Bowers, female    DOB: 1971-02-13, 45 y.o.   MRN: [id number]  HPI: Victoria Bowers is a 45 y.o. female  Chief Complaint  Patient presents with  . Sore Throat    Patient states that she has a lump under her tongue, and that it hurts to swallow.   LUMP Duration: couple of days Location: suddenly Onset: sudden Painful: yes Discomfort: yes Status:  not changing Trauma: no Redness: no Bruising: no Recent infection: yes Swollen lymph nodes: no History of cancer: no Family history of cancer: no History of the same: no Associated signs and symptoms: trouble swallowing and tongue feels big  Relevant past medical, surgical, family and social history reviewed and updated as indicated. Interim medical history since our last visit reviewed. Allergies and medications reviewed and updated.  Review of Systems  Constitutional: Negative.   HENT: Positive for dental problem, drooling and mouth sores. Negative for congestion, ear discharge, ear pain, facial swelling, hearing loss, nosebleeds, postnasal drip, rhinorrhea, sinus pressure, sneezing, sore throat, tinnitus, trouble swallowing and voice change.   Respiratory: Negative.   Cardiovascular: Negative.   Psychiatric/Behavioral: Negative.     Per HPI unless specifically indicated above     Objective:    BP 123/82 mmHg  Pulse 59  Temp(Src) 99.2 F (37.3 C)  Wt 220 lb (99.791 kg)  SpO2 98%  Wt Readings from Last 3 Encounters:  01/23/16 220 lb (99.791 kg)  11/28/15 217 lb 12.8 oz (98.793 kg)  11/02/15 214 lb 6.4 oz (97.251 kg)    Physical Exam  Constitutional: She is oriented to person, place, and time. She appears well-developed and well-nourished. No distress.  HENT:  Head: Normocephalic and atraumatic.  Right Ear: Hearing and external ear normal.  Left Ear: Hearing and external ear  normal.  Nose: Nose normal.  Mouth/Throat: Uvula is midline, oropharynx is clear and moist and mucous membranes are normal. No oropharyngeal exudate.    Eyes: Conjunctivae, EOM and lids are normal. Pupils are equal, round, and reactive to light. Right eye exhibits no discharge. Left eye exhibits no discharge. No scleral icterus.  Neck: Normal range of motion. Neck supple. No JVD present. No tracheal deviation present. No thyromegaly present.  Cardiovascular: Normal rate, regular rhythm, normal heart sounds and intact distal pulses.  Exam reveals no gallop and no friction rub.   No murmur heard. Pulmonary/Chest: Effort normal and breath sounds normal. No stridor. No respiratory distress. She has no wheezes. She has no rales. She exhibits no tenderness.  Musculoskeletal: Normal range of motion.  Lymphadenopathy:    She has no cervical adenopathy.  Neurological: She is alert and oriented to person, place, and time.  Skin: Skin is warm, dry and intact. No rash noted. She is not diaphoretic. No erythema. No pallor.  Psychiatric: She has a normal mood and affect. Her speech is normal and behavior is normal. Judgment and thought content normal. Cognition and memory are normal.  Nursing note and vitals reviewed.   Results for orders placed or performed in visit on 09/10/15  Microscopic Examination  Result Value Ref Range   WBC, UA 0-5 0 -  5 /hpf   RBC, UA 3-10 (A) 0 -  2 /hpf   Epithelial Cells (non renal) 0-10 0 - 10 /hpf   Bacteria, UA Few None seen/Few  CBC with Differential/Platelet  Result Value Ref Range   WBC 7.6 3.4 - 10.8 x10E3/uL   RBC 4.18 3.77 - 5.28 x10E6/uL   Hemoglobin 12.6 11.1 - 15.9 g/dL   Hematocrit 37.0 34.0 - 46.6 %   MCV 89 79 - 97 fL   MCH 30.1 26.6 - 33.0 pg   MCHC 34.1 31.5 - 35.7 g/dL   RDW 12.8 12.3 - 15.4 %   Platelets 374 150 - 379 x10E3/uL   Neutrophils 61 %   Lymphs 29 %   Monocytes 6 %   Eos 3 %   Basos 1 %   Neutrophils Absolute 4.6 1.4 - 7.0  x10E3/uL   Lymphocytes Absolute 2.2 0.7 - 3.1 x10E3/uL   Monocytes Absolute 0.5 0.1 - 0.9 x10E3/uL   EOS (ABSOLUTE) 0.2 0.0 - 0.4 x10E3/uL   Basophils Absolute 0.0 0.0 - 0.2 x10E3/uL   Immature Granulocytes 0 %   Immature Grans (Abs) 0.0 0.0 - 0.1 x10E3/uL  Comprehensive metabolic panel  Result Value Ref Range   Glucose 101 (H) 65 - 99 mg/dL   BUN 15 6 - 24 mg/dL   Creatinine, Ser 0.80 0.57 - 1.00 mg/dL   GFR calc non Af Amer 89 >59 mL/min/1.73   GFR calc Af Amer 102 >59 mL/min/1.73   BUN/Creatinine Ratio 19 9 - 23   Sodium 132 (L) 134 - 144 mmol/L   Potassium 4.6 3.5 - 5.2 mmol/L   Chloride 97 96 - 106 mmol/L   CO2 22 18 - 29 mmol/L   Calcium 9.1 8.7 - 10.2 mg/dL   Total Protein 6.4 6.0 - 8.5 g/dL   Albumin 4.0 3.5 - 5.5 g/dL   Globulin, Total 2.4 1.5 - 4.5 g/dL   Albumin/Globulin Ratio 1.7 1.1 - 2.5   Bilirubin Total 0.3 0.0 - 1.2 mg/dL   Alkaline Phosphatase 78 39 - 117 IU/L   AST 22 0 - 40 IU/L   ALT 17 0 - 32 IU/L  HIV antibody  Result Value Ref Range   HIV Screen 4th Generation wRfx Non Reactive Non Reactive  TSH  Result Value Ref Range   TSH 2.500 0.450 - 4.500 uIU/mL  Lipid Panel w/o Chol/HDL Ratio  Result Value Ref Range   Cholesterol, Total 181 100 - 199 mg/dL   Triglycerides 100 0 - 149 mg/dL   HDL 54 >39 mg/dL   VLDL Cholesterol Cal 20 5 - 40 mg/dL   LDL Calculated 107 (H) 0 - 99 mg/dL  UA/M w/rflx Culture, Routine  Result Value Ref Range   Urine Culture, Routine Final report    Urine Culture result 1 Comment   Microalbumin, Urine Waived  Result Value Ref Range   Microalb, Ur Waived 10 0 - 19 mg/L   Creatinine, Urine Waived 50 10 - 300 mg/dL   Microalb/Creat Ratio 30-300 (H) <30 mg/g  Uric acid  Result Value Ref Range   Uric Acid 3.9 2.5 - 7.1 mg/dL  IGP, Aptima HPV, rfx 16/18,45  Result Value Ref Range   DIAGNOSIS: Comment    Specimen adequacy: Comment    CLINICIAN PROVIDED ICD10: Comment    Performed by: Comment    PAP SMEAR COMMENT .    Note:  Comment    Test Methodology Comment    HPV Aptima Negative Negative      Assessment & Plan:   Problem List Items Addressed This Visit    None    Visit Diagnoses    Infection of mouth    -  Primary    Will start on augmentin and magic mouthwash. To go to ER if feels like she can't breathe. Follow up in 1 week.     Relevant Medications    magic mouthwash w/lidocaine SOLN        Follow up plan: Return in about 1 week (around 01/30/2016).

## 2016-01-30 ENCOUNTER — Ambulatory Visit: Payer: BLUE CROSS/BLUE SHIELD | Admitting: Dietician

## 2016-01-30 ENCOUNTER — Ambulatory Visit (INDEPENDENT_AMBULATORY_CARE_PROVIDER_SITE_OTHER): Payer: BLUE CROSS/BLUE SHIELD | Admitting: Unknown Physician Specialty

## 2016-01-30 ENCOUNTER — Encounter: Payer: Self-pay | Admitting: Unknown Physician Specialty

## 2016-01-30 VITALS — BP 137/79 | HR 69 | Temp 98.5°F | Ht 62.3 in | Wt 220.0 lb

## 2016-01-30 DIAGNOSIS — J029 Acute pharyngitis, unspecified: Secondary | ICD-10-CM

## 2016-01-30 DIAGNOSIS — K1379 Other lesions of oral mucosa: Secondary | ICD-10-CM | POA: Diagnosis not present

## 2016-01-30 MED ORDER — AMOXICILLIN-POT CLAVULANATE 875-125 MG PO TABS: 1.0000 | ORAL_TABLET | Freq: Two times a day (BID) | ORAL | Status: DC

## 2016-01-30 NOTE — Progress Notes (Signed)
BP 137/79 mmHg  Pulse 69  Temp(Src) 98.5 F (36.9 C)  Ht 5' 2.3" (1.582 m)  Wt 220 lb (99.791 kg)  BMI 39.87 kg/m2  SpO2 97%   Subjective:    Patient ID: Victoria Bowers, female    DOB: 07/29/1971, 45 y.o.   MRN: [id number]  HPI: Victoria Bowers is a 45 y.o. female  Chief Complaint  Patient presents with  . Edema    pt is here for 1 week f/u. states she stopped the mouth wash and throat still has lumps and swelling   Pt is here for f/u of a mouth infection treated 1 week ago and given Augmentin and magic mouthwash.  Noted severe  pain under her tongue with lumps.  It is better with antibiotics.  But she continues being tired and reports "freezing" at work.  States she went to the dentist 1 week ago.    Relevant past medical, surgical, family and social history reviewed and updated as indicated. Interim medical history since our last visit reviewed. Allergies and medications reviewed and updated.  Review of Systems  Per HPI unless specifically indicated above     Objective:    BP 137/79 mmHg  Pulse 69  Temp(Src) 98.5 F (36.9 C)  Ht 5' 2.3" (1.582 m)  Wt 220 lb (99.791 kg)  BMI 39.87 kg/m2  SpO2 97%  Wt Readings from Last 3 Encounters:  01/30/16 220 lb (99.791 kg)  01/23/16 220 lb (99.791 kg)  11/28/15 217 lb 12.8 oz (98.793 kg)    Physical Exam  Constitutional: She is oriented to person, place, and time. She appears well-developed and well-nourished. No distress.  HENT:  Head: Normocephalic and atraumatic.  Right Ear: Tympanic membrane, external ear and ear canal normal.  Left Ear: Tympanic membrane, external ear and ear canal normal.  Nose: Nose normal.  Mouth/Throat: Uvula is midline. No oropharyngeal exudate, posterior oropharyngeal edema, posterior oropharyngeal erythema or tonsillar abscesses.  Eyes: Conjunctivae and lids are normal. Right eye exhibits no discharge. Left eye exhibits no discharge. No scleral icterus.  Neck: Normal range of  motion. Neck supple. No JVD present. Carotid bruit is not present.  Cardiovascular: Normal rate, regular rhythm and normal heart sounds.   Pulmonary/Chest: Effort normal and breath sounds normal.  Abdominal: Normal appearance. There is no splenomegaly or hepatomegaly.  Musculoskeletal: Normal range of motion.  Neurological: She is alert and oriented to person, place, and time.  Skin: Skin is warm, dry and intact. No rash noted. No pallor.  Psychiatric: She has a normal mood and affect. Her behavior is normal. Judgment and thought content normal.   WBC is normal  Assessment & Plan:   Problem List Items Addressed This Visit    None    Visit Diagnoses    Pharyngitis    -  Primary    Relevant Orders    CBC With Differential/Platelet    Mononucleosis screen    Mouth pain        It seems like she is getting better either on her own or help from antibiotics.  I would like her to finish antibiotics and let us know if problem is persistant       If this is persistent will plan to put her back on antibiotics and refer to ENT- sees Dr. Harrington Challenger at [facility].  Await mono spot.  Written rx given for the weekend.    Follow up plan: Return if symptoms worsen or fail to improve.

## 2016-01-31 LAB — CBC WITH DIFFERENTIAL/PLATELET
Hematocrit: 36.1 % (ref 34.0–46.6)
Hemoglobin: 12.6 g/dL (ref 11.1–15.9)
LYMPHS ABS: 3.1 10*3/uL (ref 0.7–3.1)
Lymphs: 34 %
MCH: 31.5 pg (ref 26.6–33.0)
MCHC: 34.9 g/dL (ref 31.5–35.7)
MCV: 90 fL (ref 79–97)
MID (Absolute): 0.8 10*3/uL (ref 0.1–1.6)
MID: 9 %
NEUTROS ABS: 5.1 10*3/uL (ref 1.4–7.0)
Neutrophils: 57 %
Platelets: 348 10*3/uL (ref 150–379)
RBC: 4 x10E6/uL (ref 3.77–5.28)
RDW: 12.8 % (ref 12.3–15.4)
WBC: 9 10*3/uL (ref 3.4–10.8)

## 2016-01-31 LAB — MONONUCLEOSIS SCREEN: Mono Screen: NEGATIVE

## 2016-02-06 ENCOUNTER — Encounter: Payer: Self-pay | Admitting: Dietician

## 2016-02-06 NOTE — Progress Notes (Signed)
Have not heard from patient to reschedule after missing 2 consecutive appointments. Sent discharge letter to MD.

## 2016-02-24 ENCOUNTER — Emergency Department: Payer: BLUE CROSS/BLUE SHIELD

## 2016-02-24 ENCOUNTER — Emergency Department
Admission: EM | Admit: 2016-02-24 | Discharge: 2016-02-24 | Disposition: A | Discharge status: Home / Self Care | Payer: BLUE CROSS/BLUE SHIELD | Attending: Emergency Medicine | Admitting: Emergency Medicine

## 2016-02-24 DIAGNOSIS — I10 Essential (primary) hypertension: Secondary | ICD-10-CM | POA: Insufficient documentation

## 2016-02-24 DIAGNOSIS — R0789 Other chest pain: Secondary | ICD-10-CM | POA: Diagnosis present

## 2016-02-24 DIAGNOSIS — R0781 Pleurodynia: Secondary | ICD-10-CM | POA: Insufficient documentation

## 2016-02-24 DIAGNOSIS — Z79899 Other long term (current) drug therapy: Secondary | ICD-10-CM | POA: Insufficient documentation

## 2016-02-24 LAB — CBC
HEMATOCRIT: 36.5 % (ref 35.0–47.0)
Hemoglobin: 12.3 g/dL (ref 12.0–16.0)
MCH: 30.3 pg (ref 26.0–34.0)
MCHC: 33.8 g/dL (ref 32.0–36.0)
MCV: 89.7 fL (ref 80.0–100.0)
Platelets: 329 10*3/uL (ref 150–440)
RBC: 4.07 MIL/uL (ref 3.80–5.20)
RDW: 12.7 % (ref 11.5–14.5)
WBC: 9.2 10*3/uL (ref 3.6–11.0)

## 2016-02-24 LAB — BASIC METABOLIC PANEL
Anion gap: 9 (ref 5–15)
BUN: 11 mg/dL (ref 6–20)
CHLORIDE: 102 mmol/L (ref 101–111)
CO2: 22 mmol/L (ref 22–32)
CREATININE: 0.82 mg/dL (ref 0.44–1.00)
Calcium: 8.9 mg/dL (ref 8.9–10.3)
GFR calc Af Amer: >60 mL/min (ref >60)
GFR calc non Af Amer: >60 mL/min (ref >60)
GLUCOSE: 124 mg/dL — AB (ref 65–99)
POTASSIUM: 3.8 mmol/L (ref 3.5–5.1)
SODIUM: 133 mmol/L — AB (ref 135–145)

## 2016-02-24 LAB — TROPONIN I: Troponin I: <0.03 ng/mL (ref <0.031)

## 2016-02-24 LAB — FIBRIN DERIVATIVES D-DIMER (ARMC ONLY): Fibrin derivatives D-dimer (ARMC): 908 — ABNORMAL HIGH (ref 0–499)

## 2016-02-24 MED ORDER — KETOROLAC TROMETHAMINE 30 MG/ML IJ SOLN
30.0000 mg | Freq: Once | INTRAMUSCULAR | Status: AC
  Administered 2016-02-24: 30 mg via INTRAVENOUS

## 2016-02-24 MED ORDER — IOPAMIDOL (ISOVUE-370) INJECTION 76%
75.0000 mL | Freq: Once | INTRAVENOUS | Status: AC | PRN
  Administered 2016-02-24: 75 mL via INTRAVENOUS

## 2016-02-24 MED ORDER — KETOROLAC TROMETHAMINE 30 MG/ML IJ SOLN
INTRAMUSCULAR | Status: AC
  Administered 2016-02-24: 30 mg via INTRAVENOUS
  Filled 2016-02-24: qty 1

## 2016-02-24 NOTE — ED Notes (Signed)
MD at bedside. 

## 2016-02-24 NOTE — ED Notes (Signed)
Pt alert and oriented X4, active, cooperative, pt in NAD. RR even and unlabored, color WNL.  Pt informed to return if any life threatening symptoms occur.  Pt to take blood pressure medication when she gets home; has not taken this AM.

## 2016-02-24 NOTE — ED Notes (Signed)
Pt arrives to ER via POV c/o left sided rib pain, worse with deep inspiration. Describes pain as sharp and shooting. Pt currently on second round of antibiotics for sinusitis. Pt reports green runny discharge from nose. Pt denies fever. Pt reports fatigue since being dx with sinusitis for the first time.

## 2016-02-24 NOTE — ED Notes (Signed)
Patient transported to CT 

## 2016-02-24 NOTE — ED Provider Notes (Signed)
[facility] Emergency Department Provider Note   ____________________________________________  Time seen: Approximately 9:15 AM  I have reviewed the triage vital signs and the nursing notes.   HISTORY  Chief Complaint Chest Pain    HPI Victoria Bowers is a 45 y.o. female patient on second round of Augmentin for sinusitis with greenish nasal discharge. She has no sinus tenderness at present when she sneezes she has a green nasal discharge she is coughing but the cough is nonproductive no fever for about the last 4 days she's had increasing amounts of left-sided pleuritic chest pain. There is some tenderness on palpation of the left lower ribs. It is worse with deep breathing. Pain is sharp and stabbing. She has no other associated symptoms. Patient says she is "not really short of breath"   Past Medical History  Diagnosis Date  . Hypertension   . Migraines   . Allergy   . Fatty liver   . Morbid obesity [facility])     Patient Active Problem List   Diagnosis Date Noted  . Left elbow tendonitis 11/28/2015  . Fatty liver 07/16/2015  . Migraine 07/16/2015  . HTN (hypertension) 03/09/2015  . Obesity 03/09/2015    Past Surgical History  Procedure Laterality Date  . Cholecystectomy    . Spine surgery  ruptured disc  . Nasal sinus surgery  X2  . Tonsillectomy and adenoidectomy  1985    Current Outpatient Rx  Name  Route  Sig  Dispense  Refill  . amLODipine (NORVASC) 5 MG tablet      TAKE 1 TABLET (5 MG TOTAL) BY MOUTH DAILY.   30 tablet   3   . amoxicillin-clavulanate (AUGMENTIN) 875-125 MG tablet   Oral   Take 1 tablet by mouth 2 (two) times daily.   20 tablet   0   . atenolol (TENORMIN) 25 MG tablet   Oral   Take 1 tablet (25 mg total) by mouth daily.   90 tablet   1   . fluticasone (FLONASE) 50 MCG/ACT nasal spray   Each Nare   Place 2 sprays into both nostrils daily as needed for allergies or rhinitis.          Marland Kitchen olmesartan  (BENICAR) 40 MG tablet   Oral   Take 1 tablet (40 mg total) by mouth daily.   90 tablet   1   . diclofenac sodium (VOLTAREN) 1 % GEL      APPLY 4 G TOPICALLY 4 (FOUR) TIMES DAILY.   100 g   0   . magic mouthwash w/lidocaine SOLN   Oral   Take 5 mLs by mouth 4 (four) times daily. Patient not taking: Reported on 01/30/2016   140 mL   0     Allergies Sulfa antibiotics  Family History  Problem Relation Age of Onset  . Hypertension Father   . Hypertension Brother   . Hypertension Paternal Grandmother   . Hypertension Brother   . Heart disease Mother     Social History Social History  Substance Use Topics  . Smoking status: Never Smoker   . Smokeless tobacco: Never Used  . Alcohol Use: No    Review of Systems Constitutional: No fever/chills Eyes: No visual changes. ENT: No sore throat. Cardiovascular: See history of present illness Respiratory: See history of present illness Gastrointestinal: No abdominal pain.  No nausea, no vomiting.  No diarrhea.  No constipation. Genitourinary: Negative for dysuria. Musculoskeletal: Negative for back pain. Skin: Negative  for rash. Neurological: Negative for headaches, focal weakness or numbness. } 10-point ROS otherwise negative.  ____________________________________________   PHYSICAL EXAM:  VITAL SIGNS: ED Triage Vitals  Enc Vitals Group     BP 02/24/16 0729 132/92 mmHg     Pulse Rate 02/24/16 0729 73     Resp 02/24/16 0729 18     Temp 02/24/16 0729 98.2 F (36.8 C)     Temp Source 02/24/16 0729 Oral     SpO2 02/24/16 0729 97 %     Weight 02/24/16 0729 220 lb (99.791 kg)     Height --      Head Cir --      Peak Flow --      Pain Score 02/24/16 0741 6     Pain Loc --      Pain Edu? --      Excl. in [place]? --     Constitutional: Alert and oriented. Well appearing and in no acute distress. Eyes: Conjunctivae are normal. PERRL. EOMI. Head: Atraumatic. Nose: No congestion/rhinnorhea. Mouth/Throat: Mucous  membranes are moist.  Oropharynx non-erythematous. Neck: No stridor.   Cardiovascular: Normal rate, regular rhythm. Grossly normal heart sounds.  Good peripheral circulation. Respiratory: Normal respiratory effort.  No retractions. Lungs CTAB. Gastrointestinal: Soft and nontender. No distention. No abdominal bruits. No CVA tenderness. Musculoskeletal: No lower extremity tenderness nor edema.  No joint effusions. Neurologic:  Normal speech and language. No gross focal neurologic deficits are appreciated. No gait instability. Skin:  Skin is warm, dry and intact. No rash noted. Psychiatric: Mood and affect are normal. Speech and behavior are normal.  ____________________________________________   LABS (all labs ordered are listed, but only abnormal results are displayed)  Labs Reviewed  BASIC METABOLIC PANEL - Abnormal; Notable for the following:    Sodium 133 (*)    Glucose, Bld 124 (*)    All other components within normal limits  FIBRIN DERIVATIVES D-DIMER ([facility] ONLY) - Abnormal; Notable for the following:    Fibrin derivatives D-dimer ([facility]) 908 (*)    All other components within normal limits  CBC  TROPONIN I   ____________________________________________  EKG  EKG read and interpreted by me shows normal sinus rhythm rate of 80 normal axis essentially normal EKG ____________________________________________  RADIOLOGY  Chest x-ray and chest CT read by radiology as no acute disease ____________________________________________   PROCEDURES    ____________________________________________   INITIAL IMPRESSION / ASSESSMENT AND PLAN / ED COURSE  Pertinent labs & imaging results that were available during my care of the patient were reviewed by me and considered in my medical decision making (see chart for details).  Patient better after Toradol. ____________________________________________   FINAL CLINICAL IMPRESSION(S) / ED DIAGNOSES  Final diagnoses:  Pleuritic  chest pain      NEW MEDICATIONS STARTED DURING THIS VISIT:  Discharge Medication List as of 02/24/2016 12:06 PM       Note:  This document was prepared using Dragon voice recognition software and may include unintentional dictation errors.    Nena Polio, MD 02/24/16 [phone]

## 2016-02-24 NOTE — ED Notes (Signed)
Pt back from CT

## 2016-03-10 ENCOUNTER — Ambulatory Visit (INDEPENDENT_AMBULATORY_CARE_PROVIDER_SITE_OTHER): Payer: BLUE CROSS/BLUE SHIELD | Admitting: Unknown Physician Specialty

## 2016-03-10 ENCOUNTER — Encounter: Payer: Self-pay | Admitting: Unknown Physician Specialty

## 2016-03-10 VITALS — BP 132/84 | HR 62 | Temp 98.3°F | Ht 61.7 in | Wt 215.8 lb

## 2016-03-10 DIAGNOSIS — N939 Abnormal uterine and vaginal bleeding, unspecified: Secondary | ICD-10-CM | POA: Insufficient documentation

## 2016-03-10 DIAGNOSIS — I10 Essential (primary) hypertension: Secondary | ICD-10-CM | POA: Diagnosis not present

## 2016-03-10 DIAGNOSIS — N926 Irregular menstruation, unspecified: Secondary | ICD-10-CM | POA: Insufficient documentation

## 2016-03-10 LAB — LIPID PANEL PICCOLO, WAIVED
CHOLESTEROL PICCOLO, WAIVED: 175 mg/dL (ref <200)
Chol/HDL Ratio Piccolo,Waive: 3.2 mg/dL
HDL CHOL PICCOLO, WAIVED: 54 mg/dL — AB (ref >59)
LDL Chol Calc Piccolo Waived: 100 mg/dL — ABNORMAL HIGH (ref <100)
Triglycerides Piccolo,Waived: 108 mg/dL (ref <150)
VLDL Chol Calc Piccolo,Waive: 22 mg/dL (ref <30)

## 2016-03-10 NOTE — Assessment & Plan Note (Signed)
Stable, continue present medications.   

## 2016-03-10 NOTE — Progress Notes (Signed)
BP 132/84 mmHg  Pulse 62  Temp(Src) 98.3 F (36.8 C)  Ht 5' 1.7" (1.567 m)  Wt 215 lb 12.8 oz (97.886 kg)  BMI 39.86 kg/m2  SpO2 96%  LMP 02/01/2016 (Exact Date)   Subjective:    Patient ID: Victoria Bowers, female    DOB: July 06, 1971, 45 y.o.   MRN: [id number]  HPI: Victoria Bowers is a 45 y.o. female  Chief Complaint  Patient presents with  . Hypertension  . Menstrual Problem    pt states her period started about 10 days ago and has not stopped   Hypertension Using medications without difficulty Average home BPs 135/80  No problems or lightheadedness No chest pain with exertion or shortness of breath No Edema  Menstrual period States "I don't know when it's coming anymore."  She has noted very heavy bleeding during exercise.  Other times she is just having spotting.  She states her periods are at the same time but typical PMS symptoms aren't there.  States this last one has been ongoing for 10 days but is mostly spotting.     Relevant past medical, surgical, family and social history reviewed and updated as indicated. Interim medical history since our last visit reviewed. Allergies and medications reviewed and updated.  Review of Systems  Per HPI unless specifically indicated above     Objective:    BP 132/84 mmHg  Pulse 62  Temp(Src) 98.3 F (36.8 C)  Ht 5' 1.7" (1.567 m)  Wt 215 lb 12.8 oz (97.886 kg)  BMI 39.86 kg/m2  SpO2 96%  LMP 02/01/2016 (Exact Date)  Wt Readings from Last 3 Encounters:  03/10/16 215 lb 12.8 oz (97.886 kg)  02/24/16 220 lb (99.791 kg)  01/30/16 220 lb (99.791 kg)    Physical Exam  Constitutional: She is oriented to person, place, and time. She appears well-developed and well-nourished. No distress.  HENT:  Head: Normocephalic and atraumatic.  Eyes: Conjunctivae and lids are normal. Right eye exhibits no discharge. Left eye exhibits no discharge. No scleral icterus.  Neck: Normal range of motion. Neck supple. No JVD  present. Carotid bruit is not present.  Cardiovascular: Normal rate, regular rhythm and normal heart sounds.   Pulmonary/Chest: Effort normal and breath sounds normal.  Abdominal: Normal appearance. There is no splenomegaly or hepatomegaly. There is no tenderness. There is no CVA tenderness.  Firmness noted pelvic area  Musculoskeletal: Normal range of motion.  Neurological: She is alert and oriented to person, place, and time.  Skin: Skin is warm, dry and intact. No rash noted. No pallor.  Psychiatric: She has a normal mood and affect. Her behavior is normal. Judgment and thought content normal.    Results for orders placed or performed during the hospital encounter of [id number]  Basic metabolic panel  Result Value Ref Range   Sodium 133 (L) 135 - 145 mmol/L   Potassium 3.8 3.5 - 5.1 mmol/L   Chloride 102 101 - 111 mmol/L   CO2 22 22 - 32 mmol/L   Glucose, Bld 124 (H) 65 - 99 mg/dL   BUN 11 6 - 20 mg/dL   Creatinine, Ser 0.82 0.44 - 1.00 mg/dL   Calcium 8.9 8.9 - 10.3 mg/dL   GFR calc non Af Amer >60 >60 mL/min   GFR calc Af Amer >60 >60 mL/min   Anion gap 9 5 - 15  CBC  Result Value Ref Range   WBC 9.2 3.6 - 11.0 K/uL   RBC 4.07 3.80 -  5.20 MIL/uL   Hemoglobin 12.3 12.0 - 16.0 g/dL   HCT 36.5 35.0 - 47.0 %   MCV 89.7 80.0 - 100.0 fL   MCH 30.3 26.0 - 34.0 pg   MCHC 33.8 32.0 - 36.0 g/dL   RDW 12.7 11.5 - 14.5 %   Platelets 329 150 - 440 K/uL  Troponin I  Result Value Ref Range   Troponin I <0.03 <0.031 ng/mL  Fibrin derivatives D-Dimer  Result Value Ref Range   Fibrin derivatives D-dimer ([facility]) 908 (H) 0 - 499      Assessment & Plan:   Problem List Items Addressed This Visit      Unprioritized   Abnormal bleeding in menstrual cycle - Primary    Check labs and do pelvic US with fullness noted in pelvic area.  No tenderness      Relevant Orders   CBC with Differential/Platelet   TSH   FSH   LH   Estrogens, Total   Prolactin   US Pelvis Complete   HTN  (hypertension)   Relevant Orders   Lipid Panel Piccolo, Waived   Comprehensive metabolic panel       Follow up plan: Return in about 6 months (around 09/09/2016), or for PE, for results.

## 2016-03-10 NOTE — Assessment & Plan Note (Signed)
Check labs and do pelvic US with fullness noted in pelvic area.  No tenderness

## 2016-03-11 ENCOUNTER — Telehealth: Payer: Self-pay | Admitting: Unknown Physician Specialty

## 2016-03-11 ENCOUNTER — Other Ambulatory Visit: Payer: Self-pay | Admitting: Unknown Physician Specialty

## 2016-03-11 ENCOUNTER — Encounter: Payer: Self-pay | Admitting: Unknown Physician Specialty

## 2016-03-11 DIAGNOSIS — R1900 Intra-abdominal and pelvic swelling, mass and lump, unspecified site: Secondary | ICD-10-CM

## 2016-03-11 NOTE — Telephone Encounter (Signed)
Routing to provider  

## 2016-03-11 NOTE — Telephone Encounter (Signed)
error 

## 2016-03-11 NOTE — Telephone Encounter (Signed)
Vivien Rota from radiology scheduling called and stated that an order needed to be entered for the pt to also have a transvaginal non OB.

## 2016-03-12 LAB — FOLLICLE STIMULATING HORMONE: FSH: 7.1 m[IU]/mL

## 2016-03-12 LAB — COMPREHENSIVE METABOLIC PANEL
ALBUMIN: 3.8 g/dL (ref 3.5–5.5)
ALT: 17 IU/L (ref 0–32)
AST: 21 IU/L (ref 0–40)
Albumin/Globulin Ratio: 1.5 (ref 1.2–2.2)
Alkaline Phosphatase: 76 IU/L (ref 39–117)
BUN/Creatinine Ratio: 12 (ref 9–23)
BUN: 10 mg/dL (ref 6–24)
Bilirubin Total: 0.3 mg/dL (ref 0.0–1.2)
CALCIUM: 8.7 mg/dL (ref 8.7–10.2)
CO2: 21 mmol/L (ref 18–29)
CREATININE: 0.86 mg/dL (ref 0.57–1.00)
Chloride: 102 mmol/L (ref 96–106)
GFR calc Af Amer: 94 mL/min/{1.73_m2} (ref >59)
GFR, EST NON AFRICAN AMERICAN: 81 mL/min/{1.73_m2} (ref >59)
GLOBULIN, TOTAL: 2.5 g/dL (ref 1.5–4.5)
GLUCOSE: 110 mg/dL — AB (ref 65–99)
Potassium: 4.8 mmol/L (ref 3.5–5.2)
SODIUM: 137 mmol/L (ref 134–144)
Total Protein: 6.3 g/dL (ref 6.0–8.5)

## 2016-03-12 LAB — LUTEINIZING HORMONE: LH: 4.9 m[IU]/mL

## 2016-03-12 LAB — CBC WITH DIFFERENTIAL/PLATELET
BASOS: 1 %
Basophils Absolute: 0 10*3/uL (ref 0.0–0.2)
EOS (ABSOLUTE): 0.2 10*3/uL (ref 0.0–0.4)
EOS: 3 %
HEMATOCRIT: 36 % (ref 34.0–46.6)
HEMOGLOBIN: 11.8 g/dL (ref 11.1–15.9)
IMMATURE GRANS (ABS): 0 10*3/uL (ref 0.0–0.1)
IMMATURE GRANULOCYTES: 0 %
Lymphocytes Absolute: 2.2 10*3/uL (ref 0.7–3.1)
Lymphs: 28 %
MCH: 29.3 pg (ref 26.6–33.0)
MCHC: 32.8 g/dL (ref 31.5–35.7)
MCV: 89 fL (ref 79–97)
MONOCYTES: 7 %
MONOS ABS: 0.5 10*3/uL (ref 0.1–0.9)
NEUTROS PCT: 61 %
Neutrophils Absolute: 4.7 10*3/uL (ref 1.4–7.0)
Platelets: 421 10*3/uL — ABNORMAL HIGH (ref 150–379)
RBC: 4.03 x10E6/uL (ref 3.77–5.28)
RDW: 12.6 % (ref 12.3–15.4)
WBC: 7.6 10*3/uL (ref 3.4–10.8)

## 2016-03-12 LAB — PROLACTIN: PROLACTIN: 14.2 ng/mL (ref 4.8–23.3)

## 2016-03-12 LAB — TSH: TSH: 1.72 u[IU]/mL (ref 0.450–4.500)

## 2016-03-12 LAB — ESTROGENS, TOTAL: ESTROGEN: 229 pg/mL

## 2016-03-19 ENCOUNTER — Ambulatory Visit
Admission: RE | Admit: 2016-03-19 | Discharge: 2016-03-19 | Disposition: A | Discharge status: Home / Self Care | Payer: BLUE CROSS/BLUE SHIELD | Source: Ambulatory Visit | Attending: Unknown Physician Specialty | Admitting: Unknown Physician Specialty

## 2016-03-19 ENCOUNTER — Other Ambulatory Visit: Payer: Self-pay | Admitting: Unknown Physician Specialty

## 2016-03-19 DIAGNOSIS — D259 Leiomyoma of uterus, unspecified: Secondary | ICD-10-CM | POA: Insufficient documentation

## 2016-03-19 DIAGNOSIS — N926 Irregular menstruation, unspecified: Secondary | ICD-10-CM

## 2016-03-19 DIAGNOSIS — N939 Abnormal uterine and vaginal bleeding, unspecified: Secondary | ICD-10-CM | POA: Insufficient documentation

## 2016-03-19 DIAGNOSIS — N83202 Unspecified ovarian cyst, left side: Secondary | ICD-10-CM | POA: Insufficient documentation

## 2016-03-19 DIAGNOSIS — R1900 Intra-abdominal and pelvic swelling, mass and lump, unspecified site: Secondary | ICD-10-CM | POA: Diagnosis not present

## 2016-03-20 ENCOUNTER — Other Ambulatory Visit: Payer: Self-pay

## 2016-03-20 ENCOUNTER — Telehealth: Payer: Self-pay | Admitting: Unknown Physician Specialty

## 2016-03-20 DIAGNOSIS — N939 Abnormal uterine and vaginal bleeding, unspecified: Secondary | ICD-10-CM

## 2016-03-20 NOTE — Telephone Encounter (Signed)
My mistake. It was supposed to be 6 weeks from now, not 6 months. Appointment rescheduled. Spoke with patient. She can see the appointment through mychart. Patient also notified to arrive with a full bladder.   Appointment is for 05/15/2016 @ 2:00.

## 2016-03-20 NOTE — Telephone Encounter (Signed)
Routing to referral coordinator. Is there anyway this patient can be seen sooner than November?

## 2016-03-20 NOTE — Telephone Encounter (Signed)
Patient called wanting to know about her ultrasound appointment. I gave her the date and time and she said she needed something sooner then November. Please call patient back, thanks.

## 2016-03-21 ENCOUNTER — Telehealth: Payer: Self-pay | Admitting: Unknown Physician Specialty

## 2016-03-21 DIAGNOSIS — N938 Other specified abnormal uterine and vaginal bleeding: Secondary | ICD-10-CM | POA: Insufficient documentation

## 2016-03-21 MED ORDER — NORETHINDRONE-ETH ESTRADIOL 1-35 MG-MCG PO TABS: 1.0000 | ORAL_TABLET | Freq: Every day | ORAL | Status: DC

## 2016-03-21 NOTE — Telephone Encounter (Signed)
Pt called and stated that she is bleeding really bad and her pain is getting really bad in her lower stomach and back and she would like to know what she should do.

## 2016-03-21 NOTE — Telephone Encounter (Signed)
Discussed with patient.  Will take Ibuprofen 800 mg and lie down.   Discussed with Dr. Wynetta Emery and will rx a pack of BCPs   Recheck in 1 months

## 2016-03-21 NOTE — Telephone Encounter (Signed)
Routing to provider for advice.

## 2016-04-09 ENCOUNTER — Ambulatory Visit (INDEPENDENT_AMBULATORY_CARE_PROVIDER_SITE_OTHER): Payer: BLUE CROSS/BLUE SHIELD | Admitting: Unknown Physician Specialty

## 2016-04-09 ENCOUNTER — Encounter: Payer: Self-pay | Admitting: Unknown Physician Specialty

## 2016-04-09 VITALS — BP 129/83 | HR 63 | Temp 98.6°F | Ht 62.7 in | Wt 220.0 lb

## 2016-04-09 DIAGNOSIS — N938 Other specified abnormal uterine and vaginal bleeding: Secondary | ICD-10-CM

## 2016-04-09 NOTE — Assessment & Plan Note (Signed)
Seems to be improved.  Will continue with no BCPs.  Encouraged to keep a calendar.

## 2016-04-09 NOTE — Progress Notes (Signed)
BP 129/83 mmHg  Pulse 63  Temp(Src) 98.6 F (37 C)  Ht 5' 2.7" (1.593 m)  Wt 220 lb (99.791 kg)  BMI 39.32 kg/m2  SpO2 97%  LMP 02/01/2016   Subjective:    Patient ID: Victoria Bowers, female    DOB: 1970-10-19, 45 y.o.   MRN: [id number]  HPI: Victoria Bowers is a 45 y.o. female  Chief Complaint  Patient presents with  . Follow-up    f/up from abnormal menstrual cycle- states she stopped BCP because they made her crazy   Pt had an [place] for DUB.  She had a severe period following that which lasted 4 days.  I started her on BCPs which she tolerated for 3 days and stopped due to irritability.  No menses since then.    Repeat US next month.    Relevant past medical, surgical, family and social history reviewed and updated as indicated. Interim medical history since our last visit reviewed. Allergies and medications reviewed and updated.  Review of Systems  Per HPI unless specifically indicated above     Objective:    BP 129/83 mmHg  Pulse 63  Temp(Src) 98.6 F (37 C)  Ht 5' 2.7" (1.593 m)  Wt 220 lb (99.791 kg)  BMI 39.32 kg/m2  SpO2 97%  LMP 02/01/2016  Wt Readings from Last 3 Encounters:  04/09/16 220 lb (99.791 kg)  03/10/16 215 lb 12.8 oz (97.886 kg)  02/24/16 220 lb (99.791 kg)    Physical Exam  Constitutional: She is oriented to person, place, and time. She appears well-developed and well-nourished. No distress.  HENT:  Head: Normocephalic and atraumatic.  Eyes: Conjunctivae and lids are normal. Right eye exhibits no discharge. Left eye exhibits no discharge. No scleral icterus.  Cardiovascular: Normal rate.   Pulmonary/Chest: Effort normal.  Abdominal: Normal appearance. There is no splenomegaly or hepatomegaly.  Musculoskeletal: Normal range of motion.  Neurological: She is alert and oriented to person, place, and time.  Skin: Skin is intact. No rash noted. No pallor.  Psychiatric: She has a normal mood and affect. Her behavior is normal.  Judgment and thought content normal.    Results for orders placed or performed in visit on 03/10/16  Lipid Panel Piccolo, [employer]  Result Value Ref Range   Cholesterol Piccolo, Waived 175 <200 mg/dL   HDL Chol Piccolo, Waived 54 (L) >59 mg/dL   Triglycerides Piccolo,Waived 108 <150 mg/dL   Chol/HDL Ratio Piccolo,Waive 3.2 mg/dL   LDL Chol Calc Piccolo Waived 100 (H) <100 mg/dL   VLDL Chol Calc Piccolo,Waive 22 <30 mg/dL  Comprehensive metabolic panel  Result Value Ref Range   Glucose 110 (H) 65 - 99 mg/dL   BUN 10 6 - 24 mg/dL   Creatinine, Ser 0.86 0.57 - 1.00 mg/dL   GFR calc non Af Amer 81 >59 mL/min/1.73   GFR calc Af Amer 94 >59 mL/min/1.73   BUN/Creatinine Ratio 12 9 - 23   Sodium 137 134 - 144 mmol/L   Potassium 4.8 3.5 - 5.2 mmol/L   Chloride 102 96 - 106 mmol/L   CO2 21 18 - 29 mmol/L   Calcium 8.7 8.7 - 10.2 mg/dL   Total Protein 6.3 6.0 - 8.5 g/dL   Albumin 3.8 3.5 - 5.5 g/dL   Globulin, Total 2.5 1.5 - 4.5 g/dL   Albumin/Globulin Ratio 1.5 1.2 - 2.2   Bilirubin Total 0.3 0.0 - 1.2 mg/dL   Alkaline Phosphatase 76 39 - 117 IU/L  AST 21 0 - 40 IU/L   ALT 17 0 - 32 IU/L  CBC with Differential/Platelet  Result Value Ref Range   WBC 7.6 3.4 - 10.8 x10E3/uL   RBC 4.03 3.77 - 5.28 x10E6/uL   Hemoglobin 11.8 11.1 - 15.9 g/dL   Hematocrit 36.0 34.0 - 46.6 %   MCV 89 79 - 97 fL   MCH 29.3 26.6 - 33.0 pg   MCHC 32.8 31.5 - 35.7 g/dL   RDW 12.6 12.3 - 15.4 %   Platelets 421 (H) 150 - 379 x10E3/uL   Neutrophils 61 %   Lymphs 28 %   Monocytes 7 %   Eos 3 %   Basos 1 %   Neutrophils Absolute 4.7 1.4 - 7.0 x10E3/uL   Lymphocytes Absolute 2.2 0.7 - 3.1 x10E3/uL   Monocytes Absolute 0.5 0.1 - 0.9 x10E3/uL   EOS (ABSOLUTE) 0.2 0.0 - 0.4 x10E3/uL   Basophils Absolute 0.0 0.0 - 0.2 x10E3/uL   Immature Granulocytes 0 %   Immature Grans (Abs) 0.0 0.0 - 0.1 x10E3/uL  TSH  Result Value Ref Range   TSH 1.720 0.450 - 4.500 uIU/mL  FSH  Result Value Ref Range   FSH 7.1  mIU/mL  LH  Result Value Ref Range   LH 4.9 mIU/mL  Estrogens, Total  Result Value Ref Range   Estrogen 229 pg/mL  Prolactin  Result Value Ref Range   Prolactin 14.2 4.8 - 23.3 ng/mL      Assessment & Plan:   Problem List Items Addressed This Visit      Unprioritized   DUB (dysfunctional uterine bleeding) - Primary    Seems to be improved.  Will continue with no BCPs.  Encouraged to keep a calendar.            Follow up plan: Return in about 3 months (around 07/10/2016).

## 2016-05-15 ENCOUNTER — Ambulatory Visit: Payer: BLUE CROSS/BLUE SHIELD

## 2016-06-05 ENCOUNTER — Ambulatory Visit: Payer: BLUE CROSS/BLUE SHIELD

## 2016-06-20 ENCOUNTER — Ambulatory Visit
Admission: RE | Admit: 2016-06-20 | Discharge: 2016-06-20 | Disposition: A | Discharge status: Home / Self Care | Payer: BLUE CROSS/BLUE SHIELD | Source: Ambulatory Visit | Attending: Unknown Physician Specialty | Admitting: Unknown Physician Specialty

## 2016-06-20 DIAGNOSIS — N939 Abnormal uterine and vaginal bleeding, unspecified: Secondary | ICD-10-CM | POA: Diagnosis present

## 2016-06-20 DIAGNOSIS — D259 Leiomyoma of uterus, unspecified: Secondary | ICD-10-CM | POA: Diagnosis not present

## 2016-06-20 DIAGNOSIS — N83202 Unspecified ovarian cyst, left side: Secondary | ICD-10-CM | POA: Diagnosis present

## 2016-06-23 ENCOUNTER — Telehealth: Payer: Self-pay | Admitting: Unknown Physician Specialty

## 2016-06-23 NOTE — Telephone Encounter (Signed)
Discussed with the patient [place] results that show fibroids.  No f/u is necessary

## 2016-07-11 ENCOUNTER — Ambulatory Visit: Payer: BLUE CROSS/BLUE SHIELD | Admitting: Unknown Physician Specialty

## 2016-08-13 ENCOUNTER — Other Ambulatory Visit: Payer: Self-pay | Admitting: Unknown Physician Specialty

## 2016-08-18 ENCOUNTER — Other Ambulatory Visit: Payer: Self-pay | Admitting: Unknown Physician Specialty

## 2016-08-20 ENCOUNTER — Ambulatory Visit: Payer: BLUE CROSS/BLUE SHIELD

## 2016-09-12 ENCOUNTER — Encounter: Payer: BLUE CROSS/BLUE SHIELD | Admitting: Unknown Physician Specialty

## 2016-09-22 IMAGING — US US PELVIS COMPLETE
1 series · 13 of 25 positions shown · non-contrast
Comparison: CT 03/08/2013.

CLINICAL DATA: Abnormal bleeding.

EXAM:
TRANSABDOMINAL AND TRANSVAGINAL ULTRASOUND OF PELVIS
TECHNIQUE: Both transabdominal and transvaginal ultrasound examinations of the
pelvis were performed. Transabdominal technique was performed for
global imaging of the pelvis including uterus, ovaries, adnexal
regions, and pelvic cul-de-sac. It was necessary to proceed with
endovaginal exam following the transabdominal exam to visualize the
uterus and ovaries.

[Series 1: us pelvis complete · 0.28mm/px · 13 of 93 slices shown]
[im 1/93]
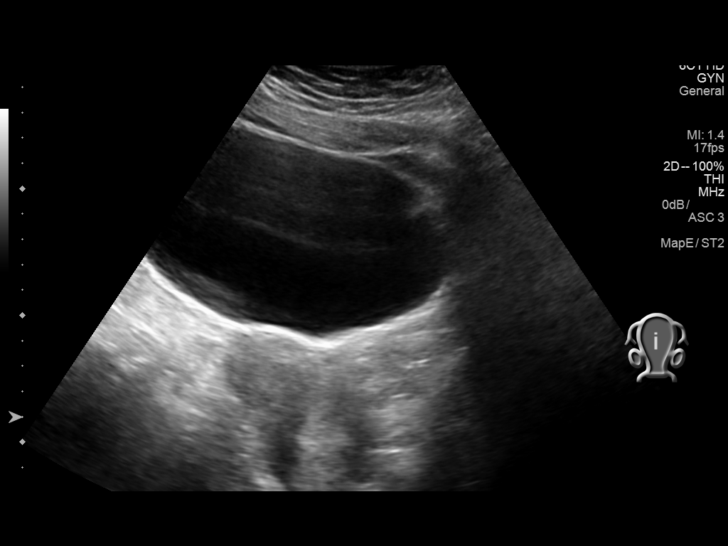
[im 8/93]
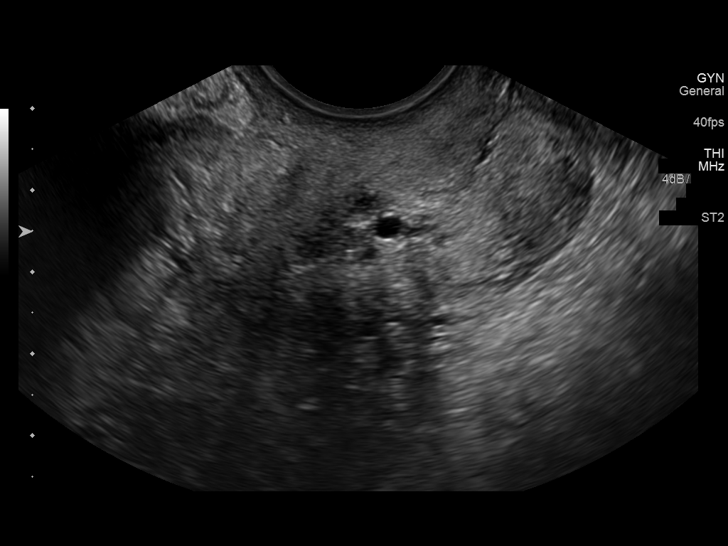
[im 16/93]
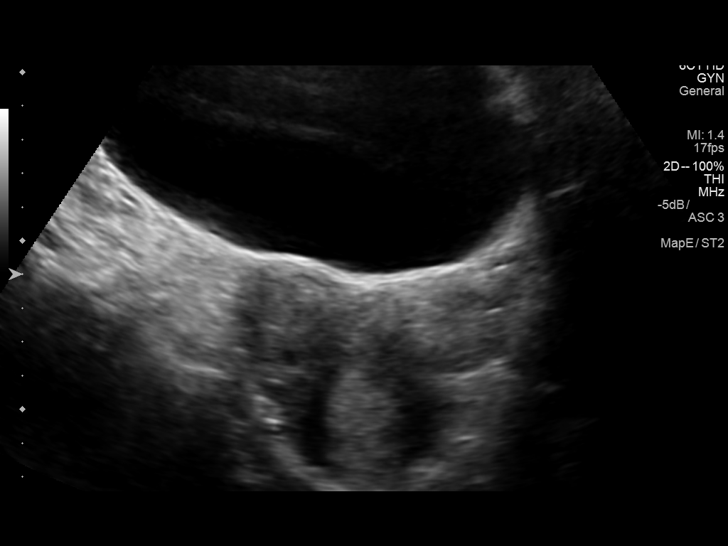
[im 24/93]
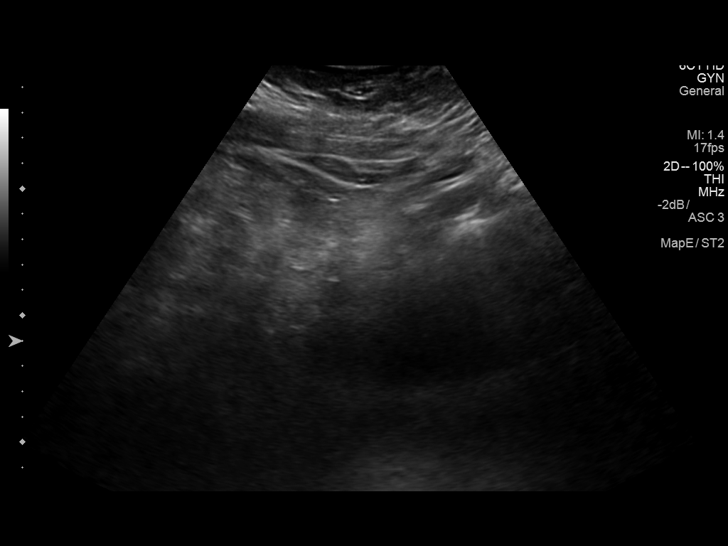
[im 31/93]
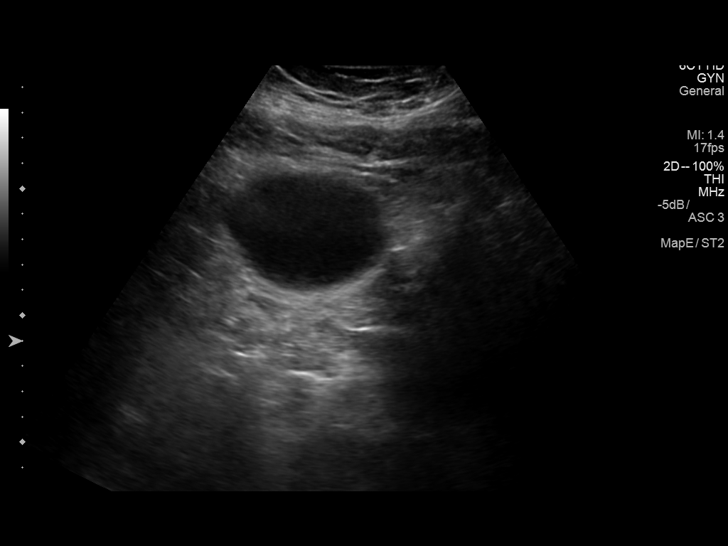
[im 39/93]
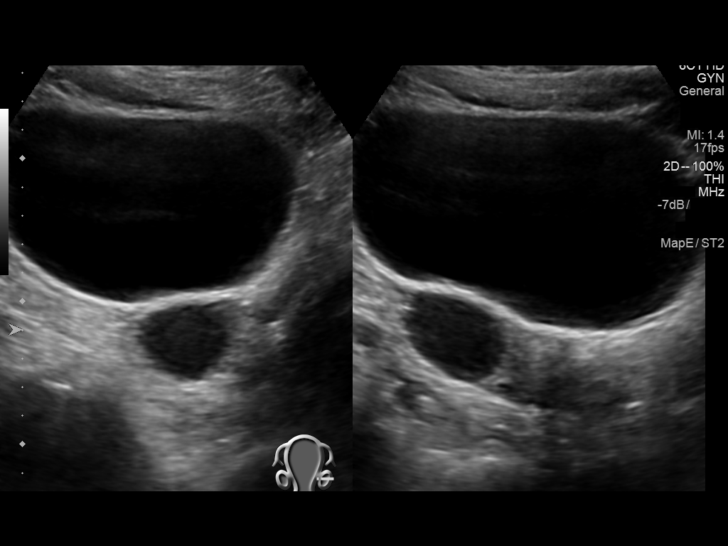
[im 47/93]
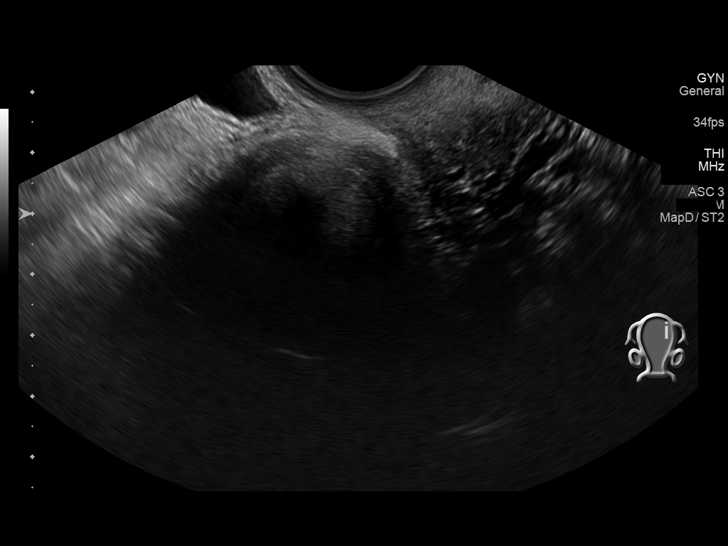
[im 54/93]
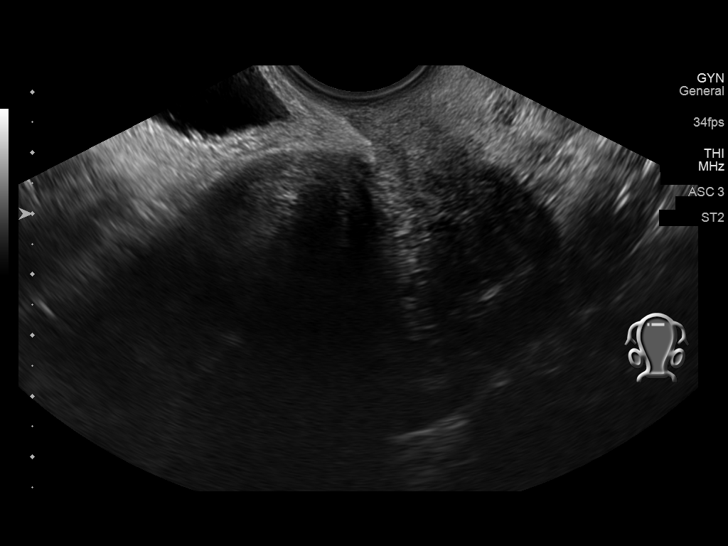
[im 62/93]
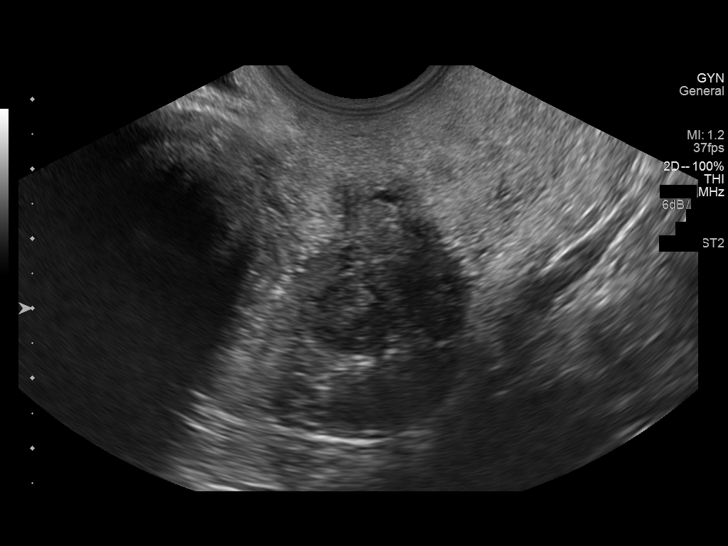
[im 70/93]
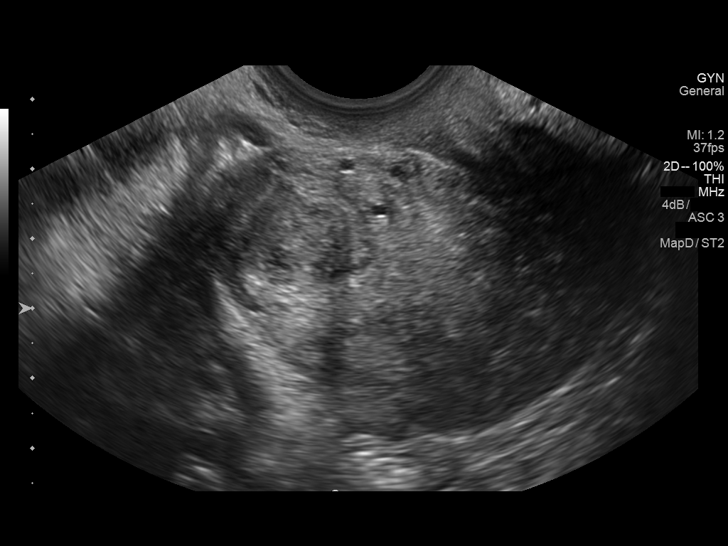
[im 77/93]
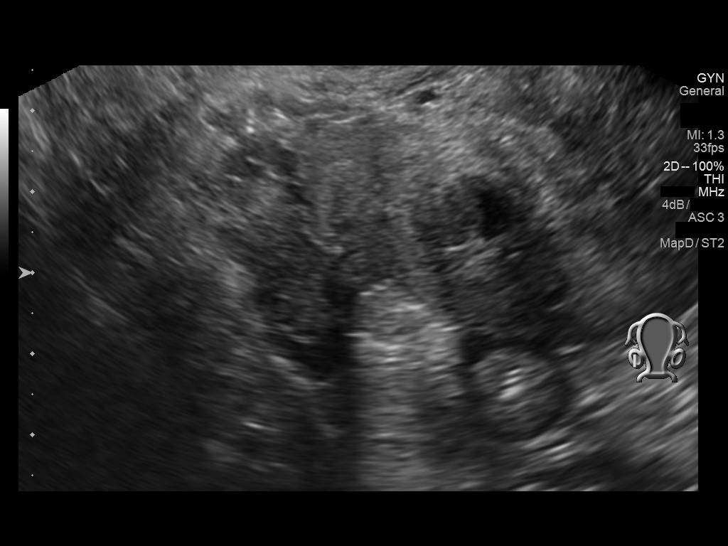
[im 85/93]
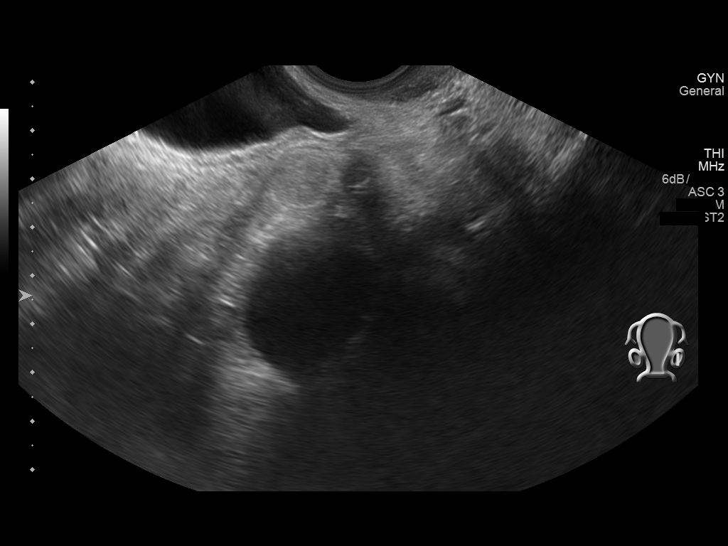
[im 93/93]
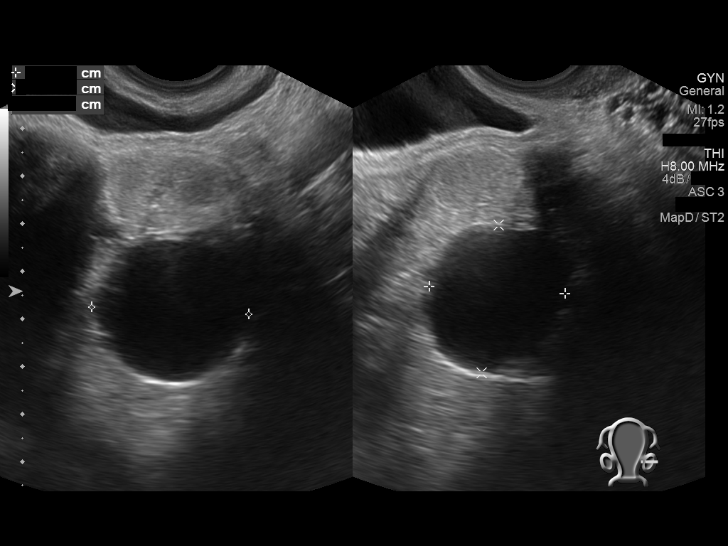

[13 of 25 positions shown; findings below may reference images not displayed]

FINDINGS: Uterus

Measurements: 7.2 x 4.6 x 6.3 cm. Multiple fibroids. The largest
measures 3.9 cm in maximum diameter and is in the left fundus.

Endometrium

Thickness: 16.2 mm.  No focal abnormality visualized.

Right ovary

Measurements: 2.2 x 1.1 x 1.2 cm. Normal appearance/no adnexal mass.

Left ovary

Measurements: 4.1 x 3.2 x 3.9 cm. 2.9 x 3.1 x 3.3 cm slightly
complex cyst, most likely hemorrhagic cyst.

Other findings

No abnormal free fluid.
IMPRESSION: 1. Fibroid uterus.  The largest measures 3.9 cm maximum diameter.

2. Left ovarian 3.3 cm cyst with faint internal echoes, most likely
a follicular cyst or hemorrhagic cyst. Short-interval follow up
ultrasound in 6-12 weeks is recommended, preferably during the week
following the patient's normal menses.

## 2016-11-14 ENCOUNTER — Ambulatory Visit (INDEPENDENT_AMBULATORY_CARE_PROVIDER_SITE_OTHER): Payer: BLUE CROSS/BLUE SHIELD | Admitting: Unknown Physician Specialty

## 2016-11-14 ENCOUNTER — Encounter: Payer: Self-pay | Admitting: Unknown Physician Specialty

## 2016-11-14 VITALS — BP 134/84 | HR 57 | Temp 99.0°F | Ht 62.0 in | Wt 219.3 lb

## 2016-11-14 DIAGNOSIS — Z0000 Encounter for general adult medical examination without abnormal findings: Secondary | ICD-10-CM

## 2016-11-14 DIAGNOSIS — I10 Essential (primary) hypertension: Secondary | ICD-10-CM | POA: Diagnosis not present

## 2016-11-14 MED ORDER — OLMESARTAN MEDOXOMIL 40 MG PO TABS: 40.0000 mg | ORAL_TABLET | Freq: Every day | ORAL | 1 refills | Status: DC

## 2016-11-14 MED ORDER — FLUTICASONE PROPIONATE 50 MCG/ACT NA SUSP: 2.0000 | Freq: Every day | NASAL | 12 refills | Status: DC | PRN

## 2016-11-14 MED ORDER — AMLODIPINE BESYLATE 5 MG PO TABS: 5.0000 mg | ORAL_TABLET | Freq: Every day | ORAL | 1 refills | Status: DC

## 2016-11-14 MED ORDER — ATENOLOL 25 MG PO TABS: 25.0000 mg | ORAL_TABLET | Freq: Every day | ORAL | 1 refills | Status: DC

## 2016-11-14 NOTE — Progress Notes (Signed)
BP 134/84 (BP Location: Left Arm, Patient Position: Sitting, Cuff Size: Large)   Pulse (!) 57   Temp 99 F (37.2 C)   Ht 5\' 2"  (1.575 m)   Wt 219 lb 4.8 oz (99.5 kg)   LMP 10/24/2016 (Approximate)   SpO2 98%   BMI 40.11 kg/m    Subjective:    Patient ID: Victoria Bowers, female    DOB: 02/16/1971, 46 y.o.   MRN: [id number]  HPI: Victoria Bowers is a 46 y.o. female  Chief Complaint  Patient presents with  . Annual Exam    pt wants to talk to provider about flu shot    Hypertension Using medications without difficulty Average home BPs   No problems or lightheadedness No chest pain with exertion or shortness of breath No Edema  She is exercising quite a bit and testing for her black belt.  She has stopped eating junk food.    Past Medical History:  Diagnosis Date  . Allergy   . Bertolotti's syndrome   . Fatty liver   . Hypertension   . Migraines   . Morbid obesity ([place])    Past Surgical History:  Procedure Laterality Date  . CHOLECYSTECTOMY    . NASAL SINUS SURGERY  X2  . SPINE SURGERY  ruptured disc  . TONSILLECTOMY AND ADENOIDECTOMY  1985   Family History  Problem Relation Age of Onset  . Hypertension Father   . Hypertension Brother   . Hypertension Paternal Grandmother   . Hypertension Brother   . Heart disease Mother    Social History   Social History  . Marital status: Single    Spouse name: N/A  . Number of children: N/A  . Years of education: N/A   Occupational History  . Not on file.   Social History Main Topics  . Smoking status: Never Smoker  . Smokeless tobacco: Never Used  . Alcohol use No  . Drug use: No  . Sexual activity: No   Other Topics Concern  . Not on file   Social History Narrative  . No narrative on file   Relevant past medical, surgical, family and social history reviewed and updated as indicated. Interim medical history since our last visit reviewed. Allergies and medications reviewed and  updated.  Review of Systems  Per HPI unless specifically indicated above     Objective:    BP 134/84 (BP Location: Left Arm, Patient Position: Sitting, Cuff Size: Large)   Pulse (!) 57   Temp 99 F (37.2 C)   Ht 5\' 2"  (1.575 m)   Wt 219 lb 4.8 oz (99.5 kg)   LMP 10/24/2016 (Approximate)   SpO2 98%   BMI 40.11 kg/m   Wt Readings from Last 3 Encounters:  11/14/16 219 lb 4.8 oz (99.5 kg)  04/09/16 220 lb (99.8 kg)  03/10/16 215 lb 12.8 oz (97.9 kg)    Physical Exam  Constitutional: She is oriented to person, place, and time. She appears well-developed and well-nourished. No distress.  HENT:  Head: Normocephalic and atraumatic.  Eyes: Conjunctivae and lids are normal. Pupils are equal, round, and reactive to light. Right eye exhibits no discharge. Left eye exhibits no discharge. No scleral icterus.  Neck: Normal range of motion. Neck supple. No JVD present. Carotid bruit is not present. No thyromegaly present.  Cardiovascular: Normal rate, regular rhythm and normal heart sounds.  Exam reveals no gallop and no friction rub.   No murmur heard. Pulmonary/Chest: Effort normal and  breath sounds normal. No respiratory distress. She has no wheezes. She has no rales.  Abdominal: Soft. Normal appearance and bowel sounds are normal. There is no splenomegaly or hepatomegaly. There is no tenderness. There is no rebound.  Genitourinary: No breast swelling, tenderness or discharge.  Musculoskeletal: Normal range of motion.  Lymphadenopathy:    She has no cervical adenopathy.  Neurological: She is alert and oriented to person, place, and time.  Skin: Skin is warm, dry and intact. No rash noted. No pallor.  Psychiatric: She has a normal mood and affect. Her speech is normal and behavior is normal. Judgment and thought content normal. Cognition and memory are normal.      Assessment & Plan:   Problem List Items Addressed This Visit      Unprioritized   HTN (hypertension)   Relevant  Medications   olmesartan (BENICAR) 40 MG tablet   amLODipine (NORVASC) 5 MG tablet   atenolol (TENORMIN) 25 MG tablet   Other Relevant Orders   CBC with Differential/Platelet   Comprehensive metabolic panel   Lipid Panel w/o Chol/HDL Ratio   TSH   VITAMIN D 25 Hydroxy (Vit-D Deficiency, Fractures)    Other Visit Diagnoses    Routine general medical examination at a health care facility    -  Primary       Follow up plan: Return in about 6 months (around 05/14/2017).

## 2016-11-15 LAB — CBC WITH DIFFERENTIAL/PLATELET
BASOS: 0 %
Basophils Absolute: 0 10*3/uL (ref 0.0–0.2)
EOS (ABSOLUTE): 0.4 10*3/uL (ref 0.0–0.4)
EOS: 4 %
HEMATOCRIT: 37.6 % (ref 34.0–46.6)
Hemoglobin: 12.1 g/dL (ref 11.1–15.9)
IMMATURE GRANS (ABS): 0 10*3/uL (ref 0.0–0.1)
IMMATURE GRANULOCYTES: 0 %
Lymphocytes Absolute: 2.8 10*3/uL (ref 0.7–3.1)
Lymphs: 30 %
MCH: 29.2 pg (ref 26.6–33.0)
MCHC: 32.2 g/dL (ref 31.5–35.7)
MCV: 91 fL (ref 79–97)
MONOS ABS: 0.8 10*3/uL (ref 0.1–0.9)
Monocytes: 9 %
NEUTROS ABS: 5.2 10*3/uL (ref 1.4–7.0)
NEUTROS PCT: 57 %
Platelets: 332 10*3/uL (ref 150–379)
RBC: 4.14 x10E6/uL (ref 3.77–5.28)
RDW: 13.7 % (ref 12.3–15.4)
WBC: 9.2 10*3/uL (ref 3.4–10.8)

## 2016-11-15 LAB — LIPID PANEL W/O CHOL/HDL RATIO
Cholesterol, Total: 156 mg/dL (ref 100–199)
HDL: 44 mg/dL (ref >39)
LDL CALC: 87 mg/dL (ref 0–99)
Triglycerides: 126 mg/dL (ref 0–149)
VLDL Cholesterol Cal: 25 mg/dL (ref 5–40)

## 2016-11-15 LAB — COMPREHENSIVE METABOLIC PANEL
ALBUMIN: 4.1 g/dL (ref 3.5–5.5)
ALK PHOS: 70 IU/L (ref 39–117)
ALT: 17 IU/L (ref 0–32)
AST: 24 IU/L (ref 0–40)
Albumin/Globulin Ratio: 1.7 (ref 1.2–2.2)
BUN / CREAT RATIO: 14 (ref 9–23)
BUN: 12 mg/dL (ref 6–24)
Bilirubin Total: 0.6 mg/dL (ref 0.0–1.2)
CO2: 21 mmol/L (ref 18–29)
CREATININE: 0.88 mg/dL (ref 0.57–1.00)
Calcium: 9.1 mg/dL (ref 8.7–10.2)
Chloride: 101 mmol/L (ref 96–106)
GFR, EST AFRICAN AMERICAN: 90 mL/min/{1.73_m2} (ref >59)
GFR, EST NON AFRICAN AMERICAN: 78 mL/min/{1.73_m2} (ref >59)
GLOBULIN, TOTAL: 2.4 g/dL (ref 1.5–4.5)
Glucose: 85 mg/dL (ref 65–99)
Potassium: 4.6 mmol/L (ref 3.5–5.2)
SODIUM: 137 mmol/L (ref 134–144)
Total Protein: 6.5 g/dL (ref 6.0–8.5)

## 2016-11-15 LAB — TSH: TSH: 1.52 u[IU]/mL (ref 0.450–4.500)

## 2016-11-15 LAB — VITAMIN D 25 HYDROXY (VIT D DEFICIENCY, FRACTURES): VIT D 25 HYDROXY: 14.3 ng/mL — AB (ref 30.0–100.0)

## 2016-12-25 IMAGING — CR DG CHEST 2V
1 series · 2 of 2 positions shown · non-contrast
Comparison: 10/09/2011.

CLINICAL DATA: Left chest and left upper quadrant abdominal pain
for the past 3 days.

EXAM:
CHEST  2 VIEW

[Series 1: dg chest 2 view · 0.14mm/px · 2 of 2 slices shown]
[im 1/2]
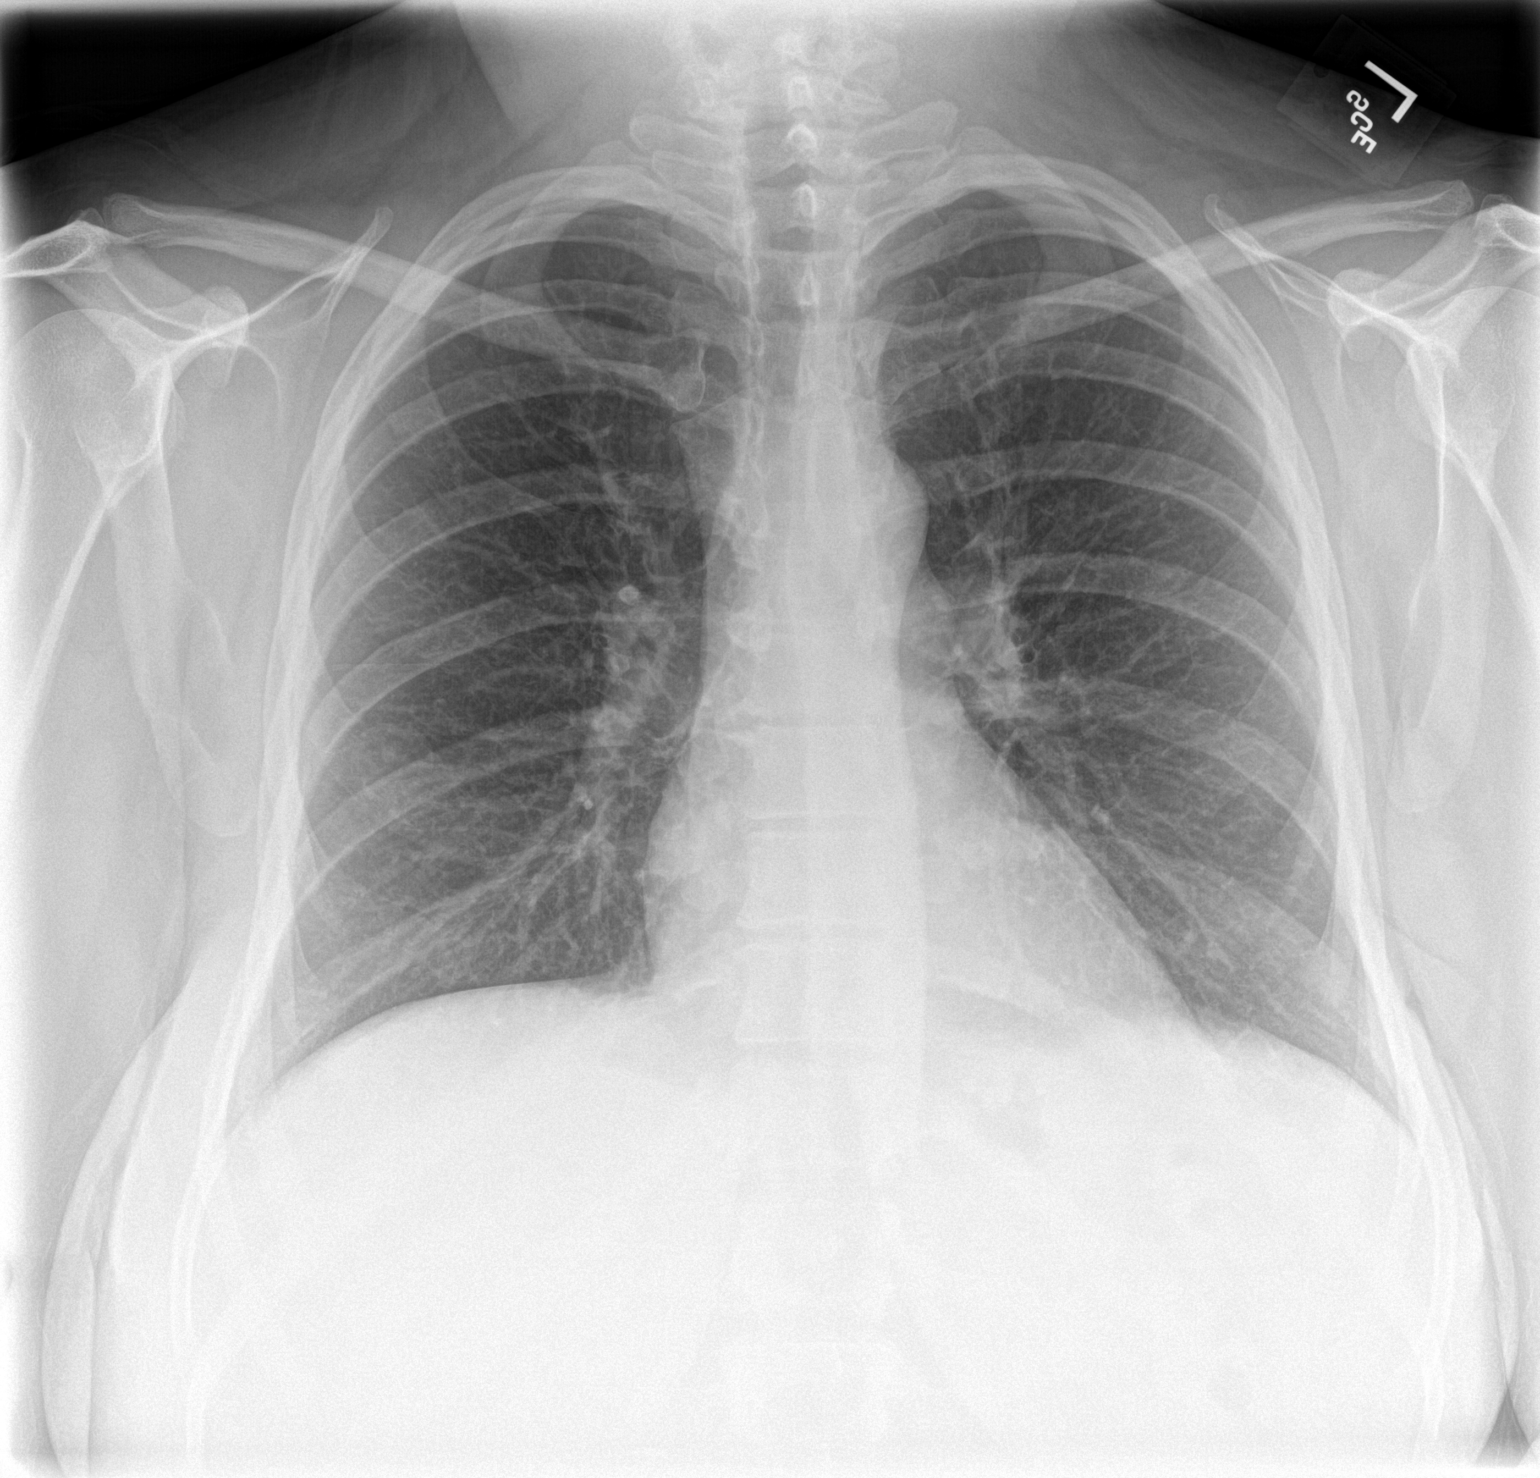
[im 2/2]
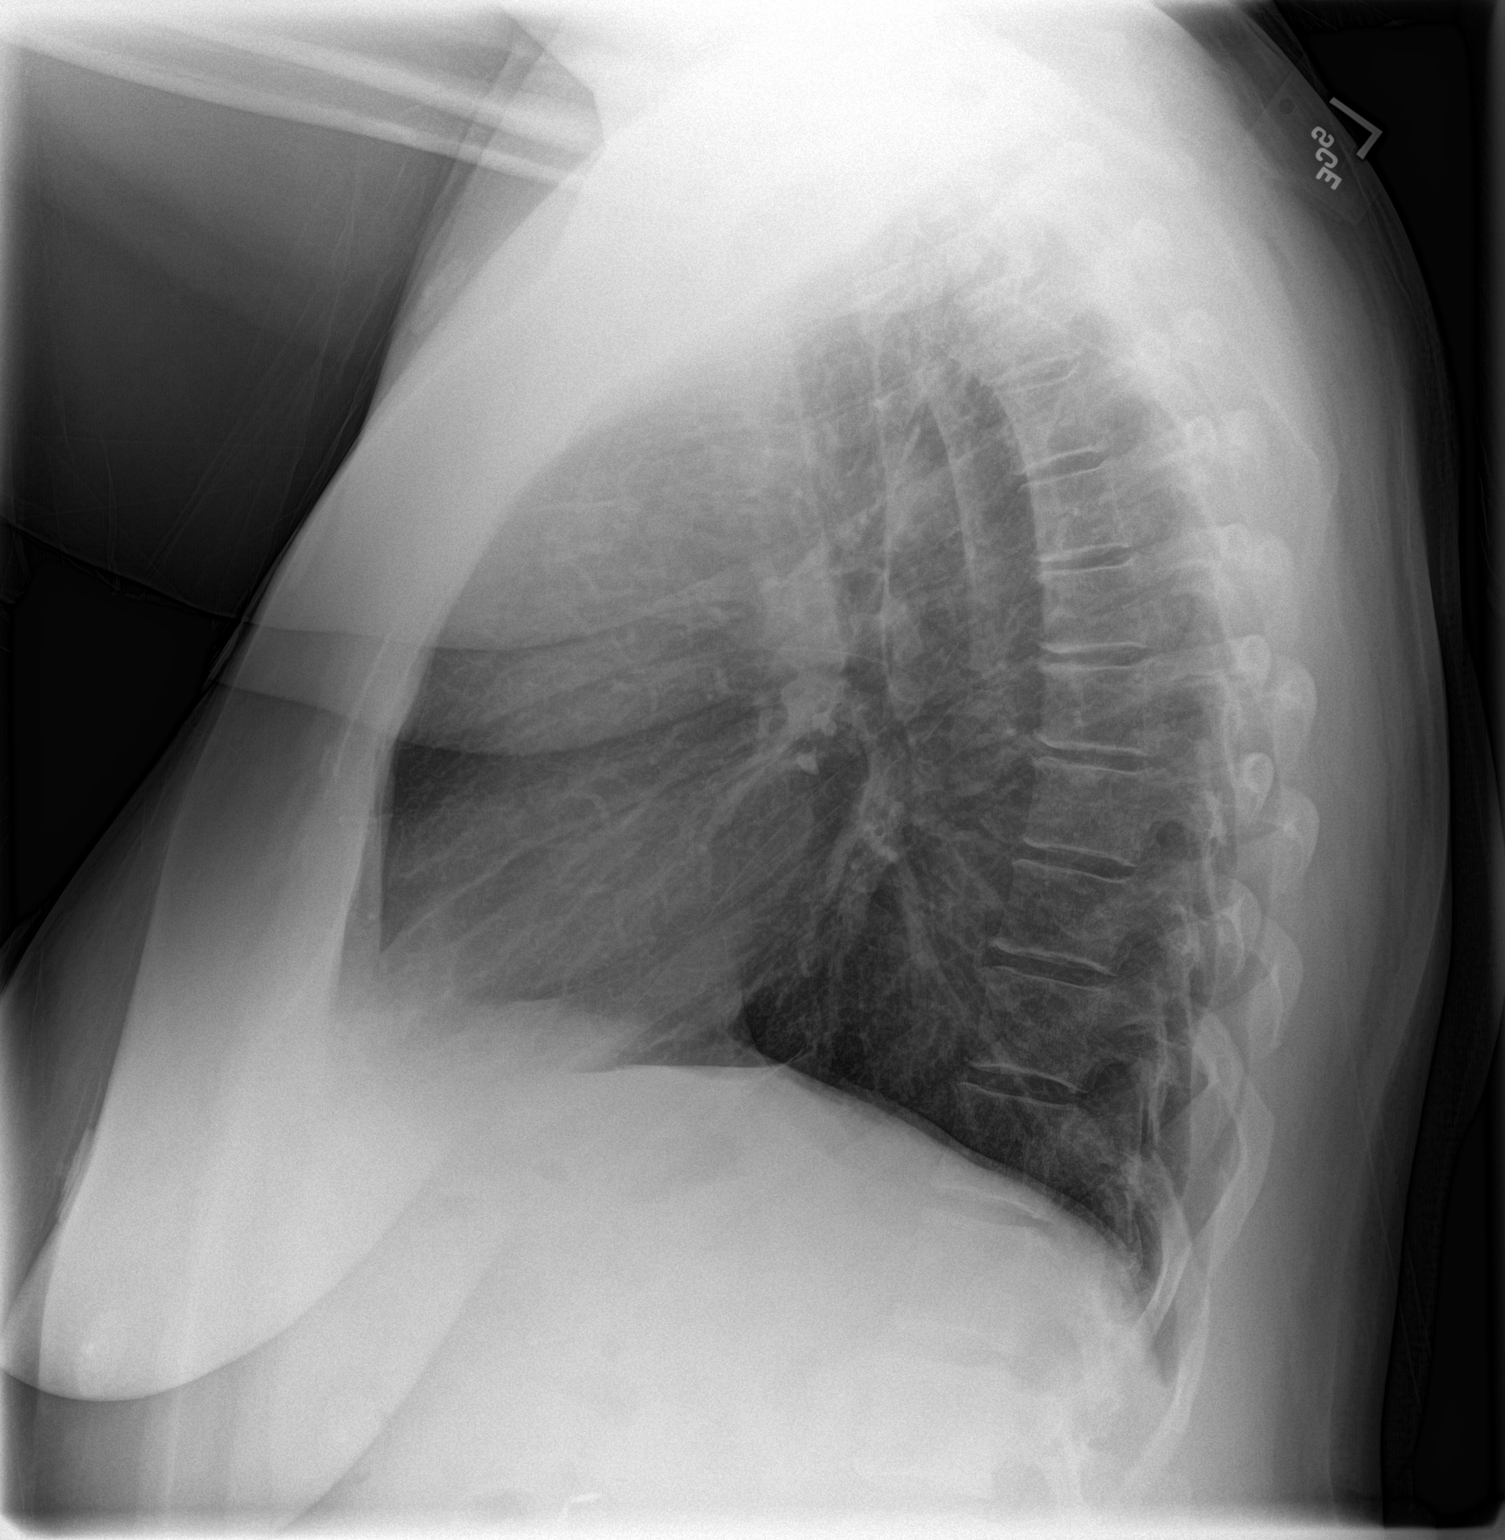

[2 of 2 positions shown; findings below may reference images not displayed]

FINDINGS: Normal sized heart. Clear lungs. Mild diffuse peribronchial
thickening without significant change. Minimal thoracic spine
degenerative changes.
IMPRESSION: No acute abnormality.  Stable mild chronic bronchitic changes.

## 2017-04-07 ENCOUNTER — Encounter: Payer: Self-pay | Admitting: Unknown Physician Specialty

## 2017-04-07 ENCOUNTER — Ambulatory Visit (INDEPENDENT_AMBULATORY_CARE_PROVIDER_SITE_OTHER): Payer: BLUE CROSS/BLUE SHIELD | Admitting: Unknown Physician Specialty

## 2017-04-07 VITALS — BP 132/81 | HR 73 | Temp 99.3°F | Wt 208.6 lb

## 2017-04-07 DIAGNOSIS — R110 Nausea: Secondary | ICD-10-CM

## 2017-04-07 DIAGNOSIS — R05 Cough: Secondary | ICD-10-CM | POA: Diagnosis not present

## 2017-04-07 DIAGNOSIS — R059 Cough, unspecified: Secondary | ICD-10-CM

## 2017-04-07 DIAGNOSIS — J0100 Acute maxillary sinusitis, unspecified: Secondary | ICD-10-CM

## 2017-04-07 MED ORDER — AZITHROMYCIN 250 MG PO TABS: ORAL_TABLET | ORAL | 0 refills | Status: DC

## 2017-04-07 MED ORDER — ONDANSETRON 8 MG PO TBDP: 8.0000 mg | ORAL_TABLET | Freq: Three times a day (TID) | ORAL | 0 refills | Status: DC | PRN

## 2017-04-07 MED ORDER — HYDROCOD POLST-CPM POLST ER 10-8 MG/5ML PO SUER: 5.0000 mL | Freq: Two times a day (BID) | ORAL | 0 refills | Status: DC | PRN

## 2017-04-07 NOTE — Progress Notes (Signed)
BP 132/81   Pulse 73   Temp 99.3 F (37.4 C)   Wt 208 lb 9.6 oz (94.6 kg)   LMP 03/23/2017 (Approximate)   SpO2 98%   BMI 38.15 kg/m    Subjective:    Patient ID: Victoria Bowers, female    DOB: [birth year], 46 y.o.   MRN: [id number]  HPI: Victoria Bowers is a 46 y.o. female  Chief Complaint  Patient presents with  . URI    pt states that she has had severe ear pain and congestion. States she was seen by Urgent Care on Sunday and given amoxicillin. Patient states she has been throwing up today so she is not sure if she kept the amoxicillin down or not.    URI   This is a new problem. The current episode started in the past 7 days. The problem has been gradually worsening. Maximum temperature: hot and cold. Associated symptoms include congestion, ear pain, headaches, nausea, sinus pain, a sore throat and vomiting.    Relevant past medical, surgical, family and social history reviewed and updated as indicated. Interim medical history since our last visit reviewed. Allergies and medications reviewed and updated.  Review of Systems  HENT: Positive for congestion, ear pain, sinus pain and sore throat.   Gastrointestinal: Positive for nausea and vomiting.  Neurological: Positive for headaches.    Per HPI unless specifically indicated above     Objective:    BP 132/81   Pulse 73   Temp 99.3 F (37.4 C)   Wt 208 lb 9.6 oz (94.6 kg)   LMP 03/23/2017 (Approximate)   SpO2 98%   BMI 38.15 kg/m   Wt Readings from Last 3 Encounters:  04/07/17 208 lb 9.6 oz (94.6 kg)  11/14/16 219 lb 4.8 oz (99.5 kg)  04/09/16 220 lb (99.8 kg)    Physical Exam  Constitutional: She is oriented to person, place, and time. She appears well-developed and well-nourished. No distress.  HENT:  Head: Normocephalic and atraumatic.  Right Ear: Tympanic membrane and ear canal normal.  Left Ear: Tympanic membrane and ear canal normal.  Nose: No rhinorrhea. Right sinus exhibits maxillary  sinus tenderness. Right sinus exhibits no frontal sinus tenderness. Left sinus exhibits maxillary sinus tenderness. Left sinus exhibits no frontal sinus tenderness.  Eyes: Conjunctivae and lids are normal. Right eye exhibits no discharge. Left eye exhibits no discharge. No scleral icterus.  Cardiovascular: Normal rate and regular rhythm.   Pulmonary/Chest: Effort normal and breath sounds normal. No respiratory distress.  Abdominal: Normal appearance. There is no splenomegaly or hepatomegaly.  Musculoskeletal: Normal range of motion.  Neurological: She is alert and oriented to person, place, and time.  Skin: Skin is intact. No rash noted. No pallor.  Psychiatric: She has a normal mood and affect. Her behavior is normal. Judgment and thought content normal.    Results for orders placed or performed in visit on 11/14/16  CBC with Differential/Platelet  Result Value Ref Range   WBC 9.2 3.4 - 10.8 x10E3/uL   RBC 4.14 3.77 - 5.28 x10E6/uL   Hemoglobin 12.1 11.1 - 15.9 g/dL   Hematocrit 37.6 34.0 - 46.6 %   MCV 91 79 - 97 fL   MCH 29.2 26.6 - 33.0 pg   MCHC 32.2 31.5 - 35.7 g/dL   RDW 13.7 12.3 - 15.4 %   Platelets 332 150 - 379 x10E3/uL   Neutrophils 57 Not Estab. %   Lymphs 30 Not Estab. %   Monocytes  9 Not Estab. %   Eos 4 Not Estab. %   Basos 0 Not Estab. %   Neutrophils Absolute 5.2 1.4 - 7.0 x10E3/uL   Lymphocytes Absolute 2.8 0.7 - 3.1 x10E3/uL   Monocytes Absolute 0.8 0.1 - 0.9 x10E3/uL   EOS (ABSOLUTE) 0.4 0.0 - 0.4 x10E3/uL   Basophils Absolute 0.0 0.0 - 0.2 x10E3/uL   Immature Granulocytes 0 Not Estab. %   Immature Grans (Abs) 0.0 0.0 - 0.1 x10E3/uL  Comprehensive metabolic panel  Result Value Ref Range   Glucose 85 65 - 99 mg/dL   BUN 12 6 - 24 mg/dL   Creatinine, Ser 0.88 0.57 - 1.00 mg/dL   GFR calc non Af Amer 78 >59 mL/min/1.73   GFR calc Af Amer 90 >59 mL/min/1.73   BUN/Creatinine Ratio 14 9 - 23   Sodium 137 134 - 144 mmol/L   Potassium 4.6 3.5 - 5.2 mmol/L    Chloride 101 96 - 106 mmol/L   CO2 21 18 - 29 mmol/L   Calcium 9.1 8.7 - 10.2 mg/dL   Total Protein 6.5 6.0 - 8.5 g/dL   Albumin 4.1 3.5 - 5.5 g/dL   Globulin, Total 2.4 1.5 - 4.5 g/dL   Albumin/Globulin Ratio 1.7 1.2 - 2.2   Bilirubin Total 0.6 0.0 - 1.2 mg/dL   Alkaline Phosphatase 70 39 - 117 IU/L   AST 24 0 - 40 IU/L   ALT 17 0 - 32 IU/L  Lipid Panel w/o Chol/HDL Ratio  Result Value Ref Range   Cholesterol, Total 156 100 - 199 mg/dL   Triglycerides 126 0 - 149 mg/dL   HDL 44 >39 mg/dL   VLDL Cholesterol Cal 25 5 - 40 mg/dL   LDL Calculated 87 0 - 99 mg/dL  TSH  Result Value Ref Range   TSH 1.520 0.450 - 4.500 uIU/mL  VITAMIN D 25 Hydroxy (Vit-D Deficiency, Fractures)  Result Value Ref Range   Vit D, 25-Hydroxy 14.3 (L) 30.0 - 100.0 ng/mL      Assessment & Plan:   Problem List Items Addressed This Visit    None    Visit Diagnoses    Acute non-recurrent maxillary sinusitis    -  Primary   Worsening on Amoxil.  Add Zithromax   Relevant Medications   amoxicillin (AMOXIL) 500 MG capsule   chlorpheniramine-HYDROcodone (TUSSIONEX PENNKINETIC ER) 10-8 MG/5ML SUER   azithromycin (ZITHROMAX Z-PAK) 250 MG tablet   Cough       Rx for Tussionex   Nausea       Rx for Zofran.  Lots of fluids       Follow up plan: Return for Needs PE.

## 2017-05-09 ENCOUNTER — Other Ambulatory Visit: Payer: Self-pay | Admitting: Unknown Physician Specialty

## 2017-05-15 ENCOUNTER — Encounter: Payer: Self-pay | Admitting: Unknown Physician Specialty

## 2017-05-15 ENCOUNTER — Ambulatory Visit (INDEPENDENT_AMBULATORY_CARE_PROVIDER_SITE_OTHER): Payer: BLUE CROSS/BLUE SHIELD | Admitting: Unknown Physician Specialty

## 2017-05-15 DIAGNOSIS — E559 Vitamin D deficiency, unspecified: Secondary | ICD-10-CM

## 2017-05-15 DIAGNOSIS — I10 Essential (primary) hypertension: Secondary | ICD-10-CM

## 2017-05-15 MED ORDER — ATENOLOL 25 MG PO TABS: 25.0000 mg | ORAL_TABLET | Freq: Every day | ORAL | 1 refills | Status: DC

## 2017-05-15 MED ORDER — AMLODIPINE BESYLATE 5 MG PO TABS: 5.0000 mg | ORAL_TABLET | Freq: Every day | ORAL | 1 refills | Status: DC

## 2017-05-15 MED ORDER — OLMESARTAN MEDOXOMIL 40 MG PO TABS: 40.0000 mg | ORAL_TABLET | Freq: Every day | ORAL | 1 refills | Status: DC

## 2017-05-15 NOTE — Progress Notes (Signed)
BP 121/77   Pulse (!) 59   Temp 98.5 F (36.9 C)   Ht 5' 3.3" (1.608 m)   Wt 215 lb 3.2 oz (97.6 kg)   LMP 05/15/2017 (Exact Date)   SpO2 97%   BMI 37.76 kg/m    Subjective:    Patient ID: Victoria Bowers Section, female    DOB: 1971/07/23, 46 y.o.   MRN: [id number]  HPI: Victoria Bowers is a 46 y.o. female  Chief Complaint  Patient presents with  . Hypertension    6 month f/up   Hypertension Using medications without difficulty Average home BPs   No problems or lightheadedness No chest pain with exertion or shortness of breath No Edema  Vitamin D deficiency Supplemented for about a month but spending time in the sun.     Relevant past medical, surgical, family and social history reviewed and updated as indicated. Interim medical history since our last visit reviewed. Allergies and medications reviewed and updated.  Review of Systems  Per HPI unless specifically indicated above     Objective:    BP 121/77   Pulse (!) 59   Temp 98.5 F (36.9 C)   Ht 5' 3.3" (1.608 m)   Wt 215 lb 3.2 oz (97.6 kg)   LMP 05/15/2017 (Exact Date)   SpO2 97%   BMI 37.76 kg/m   Wt Readings from Last 3 Encounters:  05/15/17 215 lb 3.2 oz (97.6 kg)  04/07/17 208 lb 9.6 oz (94.6 kg)  11/14/16 219 lb 4.8 oz (99.5 kg)    Physical Exam  Constitutional: She is oriented to person, place, and time. She appears well-developed and well-nourished. No distress.  HENT:  Head: Normocephalic and atraumatic.  Eyes: Conjunctivae and lids are normal. Right eye exhibits no discharge. Left eye exhibits no discharge. No scleral icterus.  Neck: Normal range of motion. Neck supple. No JVD present. Carotid bruit is not present.  Cardiovascular: Normal rate, regular rhythm and normal heart sounds.   Pulmonary/Chest: Effort normal and breath sounds normal.  Abdominal: Normal appearance. There is no splenomegaly or hepatomegaly.  Musculoskeletal: Normal range of motion.  Neurological: She is  alert and oriented to person, place, and time.  Skin: Skin is warm, dry and intact. No rash noted. No pallor.  Psychiatric: She has a normal mood and affect. Her behavior is normal. Judgment and thought content normal.    Results for orders placed or performed in visit on 11/14/16  CBC with Differential/Platelet  Result Value Ref Range   WBC 9.2 3.4 - 10.8 x10E3/uL   RBC 4.14 3.77 - 5.28 x10E6/uL   Hemoglobin 12.1 11.1 - 15.9 g/dL   Hematocrit 37.6 34.0 - 46.6 %   MCV 91 79 - 97 fL   MCH 29.2 26.6 - 33.0 pg   MCHC 32.2 31.5 - 35.7 g/dL   RDW 13.7 12.3 - 15.4 %   Platelets 332 150 - 379 x10E3/uL   Neutrophils 57 Not Estab. %   Lymphs 30 Not Estab. %   Monocytes 9 Not Estab. %   Eos 4 Not Estab. %   Basos 0 Not Estab. %   Neutrophils Absolute 5.2 1.4 - 7.0 x10E3/uL   Lymphocytes Absolute 2.8 0.7 - 3.1 x10E3/uL   Monocytes Absolute 0.8 0.1 - 0.9 x10E3/uL   EOS (ABSOLUTE) 0.4 0.0 - 0.4 x10E3/uL   Basophils Absolute 0.0 0.0 - 0.2 x10E3/uL   Immature Granulocytes 0 Not Estab. %   Immature Grans (Abs) 0.0 0.0 - 0.1  x10E3/uL  Comprehensive metabolic panel  Result Value Ref Range   Glucose 85 65 - 99 mg/dL   BUN 12 6 - 24 mg/dL   Creatinine, Ser 0.88 0.57 - 1.00 mg/dL   GFR calc non Af Amer 78 >59 mL/min/1.73   GFR calc Af Amer 90 >59 mL/min/1.73   BUN/Creatinine Ratio 14 9 - 23   Sodium 137 134 - 144 mmol/L   Potassium 4.6 3.5 - 5.2 mmol/L   Chloride 101 96 - 106 mmol/L   CO2 21 18 - 29 mmol/L   Calcium 9.1 8.7 - 10.2 mg/dL   Total Protein 6.5 6.0 - 8.5 g/dL   Albumin 4.1 3.5 - 5.5 g/dL   Globulin, Total 2.4 1.5 - 4.5 g/dL   Albumin/Globulin Ratio 1.7 1.2 - 2.2   Bilirubin Total 0.6 0.0 - 1.2 mg/dL   Alkaline Phosphatase 70 39 - 117 IU/L   AST 24 0 - 40 IU/L   ALT 17 0 - 32 IU/L  Lipid Panel w/o Chol/HDL Ratio  Result Value Ref Range   Cholesterol, Total 156 100 - 199 mg/dL   Triglycerides 126 0 - 149 mg/dL   HDL 44 >39 mg/dL   VLDL Cholesterol Cal 25 5 - 40 mg/dL    LDL Calculated 87 0 - 99 mg/dL  TSH  Result Value Ref Range   TSH 1.520 0.450 - 4.500 uIU/mL  VITAMIN D 25 Hydroxy (Vit-D Deficiency, Fractures)  Result Value Ref Range   Vit D, 25-Hydroxy 14.3 (L) 30.0 - 100.0 ng/mL      Assessment & Plan:   Problem List Items Addressed This Visit      Unprioritized   HTN (hypertension)    Stable, continue present medications.        Relevant Medications   amLODipine (NORVASC) 5 MG tablet   olmesartan (BENICAR) 40 MG tablet   atenolol (TENORMIN) 25 MG tablet   Vitamin D deficiency    Check levels today      Relevant Orders   VITAMIN D 25 Hydroxy (Vit-D Deficiency, Fractures)       Follow up plan: Return in about 6 months (around 11/15/2017) for physical.

## 2017-05-15 NOTE — Assessment & Plan Note (Signed)
Stable, continue present medications.   

## 2017-05-15 NOTE — Assessment & Plan Note (Addendum)
Check levels today 

## 2017-05-16 LAB — VITAMIN D 25 HYDROXY (VIT D DEFICIENCY, FRACTURES): VIT D 25 HYDROXY: 19.1 ng/mL — AB (ref 30.0–100.0)

## 2017-11-17 ENCOUNTER — Encounter: Payer: Self-pay | Admitting: Unknown Physician Specialty

## 2017-11-17 ENCOUNTER — Ambulatory Visit (INDEPENDENT_AMBULATORY_CARE_PROVIDER_SITE_OTHER): Payer: BLUE CROSS/BLUE SHIELD | Admitting: Unknown Physician Specialty

## 2017-11-17 VITALS — BP 109/67 | HR 62 | Temp 98.3°F | Ht 62.5 in | Wt 229.3 lb

## 2017-11-17 DIAGNOSIS — J0110 Acute frontal sinusitis, unspecified: Secondary | ICD-10-CM | POA: Diagnosis not present

## 2017-11-17 DIAGNOSIS — Z0000 Encounter for general adult medical examination without abnormal findings: Secondary | ICD-10-CM

## 2017-11-17 DIAGNOSIS — M778 Other enthesopathies, not elsewhere classified: Secondary | ICD-10-CM

## 2017-11-17 DIAGNOSIS — R05 Cough: Secondary | ICD-10-CM | POA: Diagnosis not present

## 2017-11-17 DIAGNOSIS — I10 Essential (primary) hypertension: Secondary | ICD-10-CM

## 2017-11-17 DIAGNOSIS — R059 Cough, unspecified: Secondary | ICD-10-CM

## 2017-11-17 DIAGNOSIS — Z0001 Encounter for general adult medical examination with abnormal findings: Secondary | ICD-10-CM

## 2017-11-17 MED ORDER — ATENOLOL 25 MG PO TABS: 25.0000 mg | ORAL_TABLET | Freq: Every day | ORAL | 1 refills | Status: DC

## 2017-11-17 MED ORDER — OLMESARTAN MEDOXOMIL 40 MG PO TABS: 40.0000 mg | ORAL_TABLET | Freq: Every day | ORAL | 1 refills | Status: DC

## 2017-11-17 MED ORDER — AMLODIPINE BESYLATE 5 MG PO TABS: 5.0000 mg | ORAL_TABLET | Freq: Every day | ORAL | 1 refills | Status: DC

## 2017-11-17 MED ORDER — AMOXICILLIN-POT CLAVULANATE 875-125 MG PO TABS: 1.0000 | ORAL_TABLET | Freq: Two times a day (BID) | ORAL | 0 refills | Status: DC

## 2017-11-17 MED ORDER — GUAIFENESIN-CODEINE 100-10 MG/5ML PO SOLN: 10.0000 mL | Freq: Three times a day (TID) | ORAL | 0 refills | Status: DC | PRN

## 2017-11-17 NOTE — Progress Notes (Signed)
BP 109/67   Pulse 62   Temp 98.3 F (36.8 C) (Oral)   Ht 5' 2.5" (1.588 m)   Wt 229 lb 4.8 oz (104 kg)   SpO2 98%   BMI 41.27 kg/m    Subjective:    Patient ID: Victoria Bowers, female    DOB: 1971/07/11, 47 y.o.   MRN: [id number]  HPI: Victoria Bowers is a 47 y.o. female  Chief Complaint  Patient presents with  . Annual Exam  . URI    pt states she has had a cold on and off for the last month   Hypertension Using medications without difficulty Average home BPs   No problems or lightheadedness No chest pain with exertion or shortness of breath No Edema  Sinusitis  This is a new problem. Episode onset: 3 weeks.   The problem has been waxing and waning since onset. There has been no fever. Associated symptoms include congestion, coughing, diaphoresis, ear pain, headaches, a hoarse voice, sinus pressure, sneezing, a sore throat and swollen glands. Pertinent negatives include no chills, neck pain or shortness of breath. Treatments tried: OTC cold meds. The treatment provided no relief.   Relevant past medical, surgical, family and social history reviewed and updated as indicated. Interim medical history since our last visit reviewed. Allergies and medications reviewed and updated.  Review of Systems  Constitutional: Positive for diaphoresis. Negative for chills.  HENT: Positive for congestion, ear pain, hoarse voice, sinus pressure, sneezing and sore throat.   Respiratory: Positive for cough. Negative for shortness of breath.   Cardiovascular: Negative.   Gastrointestinal: Negative.   Genitourinary: Negative.   Musculoskeletal: Negative for neck pain.       Elbow tendonitis from karate  Neurological: Positive for headaches.  Psychiatric/Behavioral: Negative.     Per HPI unless specifically indicated above     Objective:    BP 109/67   Pulse 62   Temp 98.3 F (36.8 C) (Oral)   Ht 5' 2.5" (1.588 m)   Wt 229 lb 4.8 oz (104 kg)   SpO2 98%   BMI 41.27  kg/m   Wt Readings from Last 3 Encounters:  11/17/17 229 lb 4.8 oz (104 kg)  05/15/17 215 lb 3.2 oz (97.6 kg)  04/07/17 208 lb 9.6 oz (94.6 kg)    Physical Exam  Constitutional: She is oriented to person, place, and time. She appears well-developed and well-nourished. No distress.  HENT:  Head: Normocephalic and atraumatic.  Right Ear: Tympanic membrane and ear canal normal.  Left Ear: Tympanic membrane and ear canal normal.  Nose: No rhinorrhea. Right sinus exhibits maxillary sinus tenderness. Right sinus exhibits no frontal sinus tenderness. Left sinus exhibits maxillary sinus tenderness. Left sinus exhibits no frontal sinus tenderness.  Eyes: Conjunctivae and lids are normal. Pupils are equal, round, and reactive to light. Right eye exhibits no discharge. Left eye exhibits no discharge. No scleral icterus.  Neck: Normal range of motion. Neck supple. Carotid bruit is not present. No thyromegaly present.  Cardiovascular: Normal rate, regular rhythm and normal heart sounds. Exam reveals no gallop and no friction rub.  No murmur heard. Pulmonary/Chest: Effort normal and breath sounds normal. No respiratory distress. She has no wheezes. She has no rales.  Abdominal: Soft. Normal appearance and bowel sounds are normal. There is no splenomegaly or hepatomegaly. There is no tenderness. There is no rebound.  Genitourinary: No breast swelling, tenderness or discharge.  Musculoskeletal: Normal range of motion.  Lymphadenopathy:  She has no cervical adenopathy.  Neurological: She is alert and oriented to person, place, and time.  Skin: Skin is warm, dry and intact. No rash noted. No pallor.  Psychiatric: She has a normal mood and affect. Her speech is normal and behavior is normal. Judgment and thought content normal. Cognition and memory are normal.    Results for orders placed or performed in visit on 05/15/17  VITAMIN D 25 Hydroxy (Vit-D Deficiency, Fractures)  Result Value Ref Range    Vit D, 25-Hydroxy 19.1 (L) 30.0 - 100.0 ng/mL      Assessment & Plan:   Problem List Items Addressed This Visit      Unprioritized   HTN (hypertension)    Stable, continue present medications.        Relevant Medications   amLODipine (NORVASC) 5 MG tablet   olmesartan (BENICAR) 40 MG tablet   atenolol (TENORMIN) 25 MG tablet   Other Relevant Orders   Comprehensive metabolic panel   Lipid Panel w/o Chol/HDL Ratio   Left elbow tendonitis    Note for karate testing       Other Visit Diagnoses    Annual physical exam    -  Primary   Relevant Orders   CBC with Differential/Platelet   TSH   Acute non-recurrent frontal sinusitis       Rx for Augmentin.  Discussed supportive care   Relevant Medications   amoxicillin-clavulanate (AUGMENTIN) 875-125 MG tablet   guaiFENesin-codeine 100-10 MG/5ML syrup   Cough       Rx for Rubitussin with codeine       Follow up plan: Return in about 6 months (around 05/17/2018).

## 2017-11-17 NOTE — Assessment & Plan Note (Signed)
Stable, continue present medications.   

## 2017-11-17 NOTE — Assessment & Plan Note (Signed)
Note for karate testing

## 2017-11-18 LAB — COMPREHENSIVE METABOLIC PANEL
ALT: 26 IU/L (ref 0–32)
AST: 22 IU/L (ref 0–40)
Albumin/Globulin Ratio: 1.7 (ref 1.2–2.2)
Albumin: 4 g/dL (ref 3.5–5.5)
Alkaline Phosphatase: 81 IU/L (ref 39–117)
BUN/Creatinine Ratio: 13 (ref 9–23)
BUN: 10 mg/dL (ref 6–24)
Bilirubin Total: 0.3 mg/dL (ref 0.0–1.2)
CALCIUM: 9.5 mg/dL (ref 8.7–10.2)
CO2: 21 mmol/L (ref 20–29)
CREATININE: 0.8 mg/dL (ref 0.57–1.00)
Chloride: 100 mmol/L (ref 96–106)
GFR calc Af Amer: 101 mL/min/{1.73_m2} (ref >59)
GFR, EST NON AFRICAN AMERICAN: 87 mL/min/{1.73_m2} (ref >59)
GLOBULIN, TOTAL: 2.4 g/dL (ref 1.5–4.5)
Glucose: 89 mg/dL (ref 65–99)
Potassium: 4.5 mmol/L (ref 3.5–5.2)
Sodium: 136 mmol/L (ref 134–144)
Total Protein: 6.4 g/dL (ref 6.0–8.5)

## 2017-11-18 LAB — CBC WITH DIFFERENTIAL/PLATELET
Basophils Absolute: 0 10*3/uL (ref 0.0–0.2)
Basos: 0 %
EOS (ABSOLUTE): 0.3 10*3/uL (ref 0.0–0.4)
EOS: 3 %
HEMATOCRIT: 37.2 % (ref 34.0–46.6)
Hemoglobin: 12.6 g/dL (ref 11.1–15.9)
IMMATURE GRANULOCYTES: 0 %
Immature Grans (Abs): 0 10*3/uL (ref 0.0–0.1)
LYMPHS: 29 %
Lymphocytes Absolute: 3.3 10*3/uL — ABNORMAL HIGH (ref 0.7–3.1)
MCH: 29.7 pg (ref 26.6–33.0)
MCHC: 33.9 g/dL (ref 31.5–35.7)
MCV: 88 fL (ref 79–97)
MONOS ABS: 0.9 10*3/uL (ref 0.1–0.9)
Monocytes: 8 %
NEUTROS PCT: 60 %
Neutrophils Absolute: 7 10*3/uL (ref 1.4–7.0)
PLATELETS: 412 10*3/uL — AB (ref 150–379)
RBC: 4.24 x10E6/uL (ref 3.77–5.28)
RDW: 13.2 % (ref 12.3–15.4)
WBC: 11.6 10*3/uL — ABNORMAL HIGH (ref 3.4–10.8)

## 2017-11-18 LAB — LIPID PANEL W/O CHOL/HDL RATIO
Cholesterol, Total: 192 mg/dL (ref 100–199)
HDL: 48 mg/dL (ref >39)
LDL CALC: 94 mg/dL (ref 0–99)
TRIGLYCERIDES: 250 mg/dL — AB (ref 0–149)
VLDL CHOLESTEROL CAL: 50 mg/dL — AB (ref 5–40)

## 2017-11-18 LAB — TSH: TSH: 2.8 u[IU]/mL (ref 0.450–4.500)

## 2017-11-18 NOTE — Progress Notes (Signed)
Notified pt by mychart

## 2018-03-18 ENCOUNTER — Ambulatory Visit (INDEPENDENT_AMBULATORY_CARE_PROVIDER_SITE_OTHER): Payer: 59 | Admitting: Physician Assistant

## 2018-03-18 VITALS — BP 124/80 | HR 65 | Temp 98.2°F | Wt 229.0 lb

## 2018-03-18 DIAGNOSIS — M5126 Other intervertebral disc displacement, lumbar region: Secondary | ICD-10-CM | POA: Insufficient documentation

## 2018-03-18 DIAGNOSIS — R05 Cough: Secondary | ICD-10-CM | POA: Diagnosis not present

## 2018-03-18 DIAGNOSIS — M545 Low back pain, unspecified: Secondary | ICD-10-CM | POA: Insufficient documentation

## 2018-03-18 DIAGNOSIS — L989 Disorder of the skin and subcutaneous tissue, unspecified: Secondary | ICD-10-CM | POA: Insufficient documentation

## 2018-03-18 DIAGNOSIS — R059 Cough, unspecified: Secondary | ICD-10-CM

## 2018-03-18 DIAGNOSIS — M461 Sacroiliitis, not elsewhere classified: Secondary | ICD-10-CM | POA: Insufficient documentation

## 2018-03-18 MED ORDER — GUAIFENESIN-CODEINE 100-10 MG/5ML PO SOLN: 10.0000 mL | Freq: Three times a day (TID) | ORAL | 0 refills | Status: DC | PRN

## 2018-03-18 MED ORDER — PREDNISONE 20 MG PO TABS: 20.0000 mg | ORAL_TABLET | Freq: Every day | ORAL | 0 refills | Status: AC

## 2018-03-18 NOTE — Progress Notes (Signed)
Subjective:    Patient ID: [name], female    DOB: [birth year], 47 y.o.   MRN: [id number]  Victoria Bowers is a 47 y.o. female presenting on 03/18/2018 for Cough (Saw [facility] x 1 week ago. Given Doxy 100 x 14 days, completed 7 days. Pt states "Infections cleared but cough still here") and Medication Refill (Would like refill on cough syrup)   HPI   Still coughing. No fevers, chills, nausea, vomiting. Cough is non productive. Taking flonase. Says claritin and zyrtec don't work for her.   Social History   Tobacco Use  . Smoking status: Never Smoker  . Smokeless tobacco: Never Used  Substance Use Topics  . Alcohol use: No    Alcohol/week: 0.0 oz  . Drug use: No    Review of Systems Per HPI unless specifically indicated above     Objective:    BP 124/80   Pulse 65   Temp 98.2 F (36.8 C) (Oral)   Wt 229 lb (103.9 kg)   SpO2 97%   BMI 41.22 kg/m   Wt Readings from Last 3 Encounters:  03/18/18 229 lb (103.9 kg)  11/17/17 229 lb 4.8 oz (104 kg)  05/15/17 215 lb 3.2 oz (97.6 kg)    Physical Exam  Constitutional: She is oriented to person, place, and time. She appears well-developed and well-nourished.  Cardiovascular: Normal rate and regular rhythm.  Pulmonary/Chest: Effort normal and breath sounds normal.  Neurological: She is alert and oriented to person, place, and time.  Skin: Skin is warm and dry.  Psychiatric: She has a normal mood and affect. Her behavior is normal.   Results for orders placed or performed in visit on 11/17/17  CBC with Differential/Platelet  Result Value Ref Range   WBC 11.6 (H) 3.4 - 10.8 x10E3/uL   RBC 4.24 3.77 - 5.28 x10E6/uL   Hemoglobin 12.6 11.1 - 15.9 g/dL   Hematocrit 37.2 34.0 - 46.6 %   MCV 88 79 - 97 fL   MCH 29.7 26.6 - 33.0 pg   MCHC 33.9 31.5 - 35.7 g/dL   RDW 13.2 12.3 - 15.4 %   Platelets 412 (H) 150 - 379 x10E3/uL   Neutrophils 60 Not Estab. %   Lymphs 29 Not Estab. %   Monocytes 8 Not Estab. %   Eos 3 Not Estab. %   Basos 0 Not Estab. %   Neutrophils Absolute 7.0 1.4 - 7.0 x10E3/uL   Lymphocytes Absolute 3.3 (H) 0.7 - 3.1 x10E3/uL   Monocytes Absolute 0.9 0.1 - 0.9 x10E3/uL   EOS (ABSOLUTE) 0.3 0.0 - 0.4 x10E3/uL   Basophils Absolute 0.0 0.0 - 0.2 x10E3/uL   Immature Granulocytes 0 Not Estab. %   Immature Grans (Abs) 0.0 0.0 - 0.1 x10E3/uL  Comprehensive metabolic panel  Result Value Ref Range   Glucose 89 65 - 99 mg/dL   BUN 10 6 - 24 mg/dL   Creatinine, Ser 0.80 0.57 - 1.00 mg/dL   GFR calc non Af Amer 87 >59 mL/min/1.73   GFR calc Af Amer 101 >59 mL/min/1.73   BUN/Creatinine Ratio 13 9 - 23   Sodium 136 134 - 144 mmol/L   Potassium 4.5 3.5 - 5.2 mmol/L   Chloride 100 96 - 106 mmol/L   CO2 21 20 - 29 mmol/L   Calcium 9.5 8.7 - 10.2 mg/dL   Total Protein 6.4 6.0 - 8.5 g/dL   Albumin 4.0 3.5 - 5.5 g/dL   Globulin, Total 2.4 1.5 -  4.5 g/dL   Albumin/Globulin Ratio 1.7 1.2 - 2.2   Bilirubin Total 0.3 0.0 - 1.2 mg/dL   Alkaline Phosphatase 81 39 - 117 IU/L   AST 22 0 - 40 IU/L   ALT 26 0 - 32 IU/L  Lipid Panel w/o Chol/HDL Ratio  Result Value Ref Range   Cholesterol, Total 192 100 - 199 mg/dL   Triglycerides 250 (H) 0 - 149 mg/dL   HDL 48 >39 mg/dL   VLDL Cholesterol Cal 50 (H) 5 - 40 mg/dL   LDL Calculated 94 0 - 99 mg/dL  TSH  Result Value Ref Range   TSH 2.800 0.450 - 4.500 uIU/mL      Assessment & Plan:   1. Cough  Likely due to PND. Needs to take antihistamine in addition to flonase. Cough medication refilled.  - predniSONE (DELTASONE) 20 MG tablet; Take 1 tablet (20 mg total) by mouth daily with breakfast for 5 days.  Dispense: 5 tablet; Refill: 0 - guaiFENesin-codeine 100-10 MG/5ML syrup; Take 10 mLs by mouth 3 (three) times daily as needed for cough.  Dispense: 120 mL; Refill: 0    Follow up plan: Return if symptoms worsen or fail to improve.  Carles Collet, PA-C [facility] 03/18/2018, 4:37 PM

## 2018-03-18 NOTE — Patient Instructions (Signed)

## 2018-05-14 ENCOUNTER — Other Ambulatory Visit: Payer: Self-pay

## 2018-05-14 ENCOUNTER — Ambulatory Visit (INDEPENDENT_AMBULATORY_CARE_PROVIDER_SITE_OTHER): Payer: 59 | Admitting: Family Medicine

## 2018-05-14 ENCOUNTER — Encounter: Payer: Self-pay | Admitting: Family Medicine

## 2018-05-14 VITALS — BP 132/83 | HR 70 | Temp 98.6°F | Ht 62.0 in | Wt 232.3 lb

## 2018-05-14 DIAGNOSIS — I10 Essential (primary) hypertension: Secondary | ICD-10-CM | POA: Diagnosis not present

## 2018-05-14 MED ORDER — AMLODIPINE BESYLATE 5 MG PO TABS: 5.0000 mg | ORAL_TABLET | Freq: Every day | ORAL | 1 refills | Status: DC

## 2018-05-14 MED ORDER — ATENOLOL 25 MG PO TABS: 25.0000 mg | ORAL_TABLET | Freq: Every day | ORAL | 1 refills | Status: DC

## 2018-05-14 MED ORDER — OLMESARTAN MEDOXOMIL 40 MG PO TABS: 40.0000 mg | ORAL_TABLET | Freq: Every day | ORAL | 1 refills | Status: DC

## 2018-05-14 NOTE — Progress Notes (Signed)
   BP 132/83   Pulse 70   Temp 98.6 F (37 C) (Oral)   Ht 5\' 2"  (1.575 m)   Wt 232 lb 4.8 oz (105.4 kg)   SpO2 96%   BMI 42.49 kg/m    Subjective:    Patient ID: [name], female    DOB: 1971-04-15, 47 y.o.   MRN: [id number]  HPI: Victoria Bowers is a 47 y.o. female  Chief Complaint  Patient presents with  . Hypertension  . Medication Refill    olmesartan, atenolol, amlodipine  . Flu Vaccine    pt refused   Here today for BP f/u. Taking medications without difficulty or side effects. Good compliance with regimen. BPs wnl when checked outside of clinic. No Cp, SOB, dizziness, syncope, hypotensive sxs. Tries to exercise and eat healthy.   Relevant past medical, surgical, family and social history reviewed and updated as indicated. Interim medical history since our last visit reviewed. Allergies and medications reviewed and updated.  Review of Systems  Per HPI unless specifically indicated above     Objective:    BP 132/83   Pulse 70   Temp 98.6 F (37 C) (Oral)   Ht 5\' 2"  (1.575 m)   Wt 232 lb 4.8 oz (105.4 kg)   SpO2 96%   BMI 42.49 kg/m   Wt Readings from Last 3 Encounters:  05/14/18 232 lb 4.8 oz (105.4 kg)  03/18/18 229 lb (103.9 kg)  11/17/17 229 lb 4.8 oz (104 kg)    Physical Exam  Constitutional: She is oriented to person, place, and time. She appears well-developed and well-nourished. No distress.  HENT:  Head: Atraumatic.  Eyes: Conjunctivae and EOM are normal.  Neck: Normal range of motion. Neck supple.  Cardiovascular: Normal rate and regular rhythm.  Pulmonary/Chest: Effort normal and breath sounds normal.  Musculoskeletal: Normal range of motion.  Neurological: She is alert and oriented to person, place, and time.  Skin: Skin is warm and dry.  Psychiatric: She has a normal mood and affect. Her behavior is normal.  Nursing note and vitals reviewed.   Results for orders placed or performed in visit on 76/22/63  Basic  Metabolic Panel (BMET)  Result Value Ref Range   Glucose 90 65 - 99 mg/dL   BUN 10 6 - 24 mg/dL   Creatinine, Ser 0.77 0.57 - 1.00 mg/dL   GFR calc non Af Amer 92 >59 mL/min/1.73   GFR calc Af Amer 106 >59 mL/min/1.73   BUN/Creatinine Ratio 13 9 - 23   Sodium 135 134 - 144 mmol/L   Potassium 4.3 3.5 - 5.2 mmol/L   Chloride 101 96 - 106 mmol/L   CO2 21 20 - 29 mmol/L   Calcium 8.6 (L) 8.7 - 10.2 mg/dL      Assessment & Plan:   Problem List Items Addressed This Visit      Cardiovascular and Mediastinum   HTN (hypertension) - Primary    Stable and WNL, continue current regimen      Relevant Medications   olmesartan (BENICAR) 40 MG tablet   atenolol (TENORMIN) 25 MG tablet   amLODipine (NORVASC) 5 MG tablet   Other Relevant Orders   Basic Metabolic Panel (BMET) (Completed)       Follow up plan: Return in about 6 months (around 11/14/2018) for CPE.

## 2018-05-15 LAB — BASIC METABOLIC PANEL
BUN/Creatinine Ratio: 13 (ref 9–23)
BUN: 10 mg/dL (ref 6–24)
CHLORIDE: 101 mmol/L (ref 96–106)
CO2: 21 mmol/L (ref 20–29)
Calcium: 8.6 mg/dL — ABNORMAL LOW (ref 8.7–10.2)
Creatinine, Ser: 0.77 mg/dL (ref 0.57–1.00)
GFR, EST AFRICAN AMERICAN: 106 mL/min/{1.73_m2} (ref >59)
GFR, EST NON AFRICAN AMERICAN: 92 mL/min/{1.73_m2} (ref >59)
Glucose: 90 mg/dL (ref 65–99)
POTASSIUM: 4.3 mmol/L (ref 3.5–5.2)
SODIUM: 135 mmol/L (ref 134–144)

## 2018-05-16 ENCOUNTER — Encounter: Payer: Self-pay | Admitting: Family Medicine

## 2018-05-16 NOTE — Patient Instructions (Signed)
Follow up for CPE 

## 2018-05-16 NOTE — Assessment & Plan Note (Signed)
Stable and WNL, continue current regimen 

## 2018-05-17 ENCOUNTER — Ambulatory Visit: Payer: BLUE CROSS/BLUE SHIELD | Admitting: Unknown Physician Specialty

## 2018-11-22 ENCOUNTER — Ambulatory Visit (INDEPENDENT_AMBULATORY_CARE_PROVIDER_SITE_OTHER): Payer: 59 | Admitting: Family Medicine

## 2018-11-22 ENCOUNTER — Encounter: Payer: Self-pay | Admitting: Family Medicine

## 2018-11-22 ENCOUNTER — Other Ambulatory Visit: Payer: Self-pay

## 2018-11-22 VITALS — BP 134/86 | HR 60 | Temp 98.4°F | Ht 62.7 in | Wt 240.2 lb

## 2018-11-22 DIAGNOSIS — Z1239 Encounter for other screening for malignant neoplasm of breast: Secondary | ICD-10-CM

## 2018-11-22 DIAGNOSIS — I10 Essential (primary) hypertension: Secondary | ICD-10-CM | POA: Diagnosis not present

## 2018-11-22 DIAGNOSIS — Z0000 Encounter for general adult medical examination without abnormal findings: Secondary | ICD-10-CM

## 2018-11-22 DIAGNOSIS — N926 Irregular menstruation, unspecified: Secondary | ICD-10-CM

## 2018-11-22 DIAGNOSIS — N951 Menopausal and female climacteric states: Secondary | ICD-10-CM | POA: Diagnosis not present

## 2018-11-22 MED ORDER — AMLODIPINE BESYLATE 5 MG PO TABS: 5.0000 mg | ORAL_TABLET | Freq: Every day | ORAL | 1 refills | Status: DC

## 2018-11-22 MED ORDER — OLMESARTAN MEDOXOMIL 40 MG PO TABS: 40.0000 mg | ORAL_TABLET | Freq: Every day | ORAL | 1 refills | Status: DC

## 2018-11-22 MED ORDER — ATENOLOL 25 MG PO TABS: 25.0000 mg | ORAL_TABLET | Freq: Every day | ORAL | 1 refills | Status: DC

## 2018-11-22 NOTE — Progress Notes (Signed)
BP 134/86   Pulse 60   Temp 98.4 F (36.9 C)   Ht 5' 2.7" (1.593 m)   Wt 240 lb 4 oz (109 kg)   SpO2 99%   BMI 42.97 kg/m    Subjective:    Patient ID: Victoria Bowers, female    DOB: January 16, 1971, 48 y.o.   MRN: [id number]  HPI: Victoria Bowers is a 48 y.o. female presenting on 11/22/2018 for comprehensive medical examination. Current medical complaints include:see below  Having frequent spotting and cramping the past few months. Had this issue a year or so ago and was given oral contraceptives which did help with sxs but she had too many side effects. Wanting to discuss non-hormone related treatments for her peri menopausal irregular bleeding. Denies significant cramping, vaginal pain, abdominal pain.   HTN - BPs WNL when checked. Taking medicines faithfully without side effects. Denies CP, SOB, HAs, dizziness.   She currently lives with: Menopausal Symptoms: no  Depression Screen done today and results listed below:  Depression screen [facility] 2/9 11/22/2018 03/18/2018 11/17/2017 11/14/2016 09/26/2015  Decreased Interest 0 0 0 0 0  Down, Depressed, Hopeless 0 0 0 0 0  PHQ - 2 Score 0 0 0 0 0    The patient does not have a history of falls. I did not complete a risk assessment for falls. A plan of care for falls was not documented.   Past Medical History:  Past Medical History:  Diagnosis Date  . Allergy   . Bertolotti's syndrome   . Fatty liver     Surgical History:  Past Surgical History:  Procedure Laterality Date  . CHOLECYSTECTOMY    . NASAL SINUS SURGERY  X2  . SPINE SURGERY  ruptured disc  . TONSILLECTOMY AND ADENOIDECTOMY  1985    Medications:  No current outpatient medications on file prior to visit.   No current facility-administered medications on file prior to visit.     Allergies:  Allergies  Allergen Reactions  . Sulfa Antibiotics Hives  . Sulfur Hives    Social History:  Social History   Socioeconomic History  . Marital status:  Single    Spouse name: Not on file  . Number of children: Not on file  . Years of education: Not on file  . Highest education level: Not on file  Occupational History  . Not on file  Social Needs  . Financial resource strain: Not on file  . Food insecurity:    Worry: Not on file    Inability: Not on file  . Transportation needs:    Medical: Not on file    Non-medical: Not on file  Tobacco Use  . Smoking status: Never Smoker  . Smokeless tobacco: Never Used  Substance and Sexual Activity  . Alcohol use: No    Alcohol/week: 0.0 standard drinks  . Drug use: No  . Sexual activity: Never  Lifestyle  . Physical activity:    Days per week: Not on file    Minutes per session: Not on file  . Stress: Not on file  Relationships  . Social connections:    Talks on phone: Not on file    Gets together: Not on file    Attends religious service: Not on file    Active member of club or organization: Not on file    Attends meetings of clubs or organizations: Not on file    Relationship status: Not on file  . Intimate partner violence:  Fear of current or ex partner: Not on file    Emotionally abused: Not on file    Physically abused: Not on file    Forced sexual activity: Not on file  Other Topics Concern  . Not on file  Social History Narrative  . Not on file   Social History   Tobacco Use  Smoking Status Never Smoker  Smokeless Tobacco Never Used   Social History   Substance and Sexual Activity  Alcohol Use No  . Alcohol/week: 0.0 standard drinks    Family History:  Family History  Problem Relation Age of Onset  . Hypertension Father   . Hypertension Brother   . Hypertension Paternal Grandmother   . Hypertension Brother   . Heart disease Mother     Past medical history, surgical history, medications, allergies, family history and social history reviewed with patient today and changes made to appropriate areas of the chart.   Review of Systems - General ROS:  negative Psychological ROS: negative Ophthalmic ROS: negative ENT ROS: negative Allergy and Immunology ROS: negative Hematological and Lymphatic ROS: negative Endocrine ROS: negative Breast ROS: negative for breast lumps Respiratory ROS: no cough, shortness of breath, or wheezing Cardiovascular ROS: no chest pain or dyspnea on exertion Gastrointestinal ROS: no abdominal pain, change in bowel habits, or black or bloody stools Genito-Urinary ROS: positive for - irregular periods Musculoskeletal ROS: negative Neurological ROS: no TIA or stroke symptoms Dermatological ROS: negative All other ROS negative except what is listed above and in the HPI.      Objective:    BP 134/86   Pulse 60   Temp 98.4 F (36.9 C)   Ht 5' 2.7" (1.593 m)   Wt 240 lb 4 oz (109 kg)   SpO2 99%   BMI 42.97 kg/m   Wt Readings from Last 3 Encounters:  11/22/18 240 lb 4 oz (109 kg)  05/14/18 232 lb 4.8 oz (105.4 kg)  03/18/18 229 lb (103.9 kg)    Physical Exam Vitals signs and nursing note reviewed.  Constitutional:      General: She is not in acute distress.    Appearance: She is well-developed.  HENT:     Head: Atraumatic.     Right Ear: External ear normal.     Left Ear: External ear normal.     Nose: Nose normal.     Mouth/Throat:     Pharynx: No oropharyngeal exudate.  Eyes:     General: No scleral icterus.    Conjunctiva/sclera: Conjunctivae normal.     Pupils: Pupils are equal, round, and reactive to light.  Neck:     Musculoskeletal: Normal range of motion and neck supple.     Thyroid: No thyromegaly.  Cardiovascular:     Rate and Rhythm: Normal rate and regular rhythm.     Heart sounds: Normal heart sounds.  Pulmonary:     Effort: Pulmonary effort is normal. No respiratory distress.     Breath sounds: Normal breath sounds.  Chest:     Breasts:        Right: No mass, skin change or tenderness.        Left: No mass, skin change or tenderness.  Abdominal:     General: Bowel  sounds are normal.     Palpations: Abdomen is soft. There is no mass.     Tenderness: There is no abdominal tenderness.  Musculoskeletal: Normal range of motion.        General: No tenderness.  Lymphadenopathy:  Cervical: No cervical adenopathy.     Upper Body:     Right upper body: No axillary adenopathy.     Left upper body: No axillary adenopathy.  Skin:    General: Skin is warm and dry.     Findings: No rash.  Neurological:     Mental Status: She is alert and oriented to person, place, and time.     Cranial Nerves: No cranial nerve deficit.  Psychiatric:        Behavior: Behavior normal.     Results for orders placed or performed in visit on 11/22/18  Microscopic Examination  Result Value Ref Range   WBC, UA 0-5 0 - 5 /hpf   RBC, UA 11-30 (A) 0 - 2 /hpf   Epithelial Cells (non renal) 0-10 0 - 10 /hpf   Bacteria, UA Few (A) None seen/Few  Urine Culture, Reflex  Result Value Ref Range   Urine Culture, Routine WILL FOLLOW   CBC with Differential/Platelet  Result Value Ref Range   WBC 12.7 (H) 3.4 - 10.8 x10E3/uL   RBC 4.23 3.77 - 5.28 x10E6/uL   Hemoglobin 12.4 11.1 - 15.9 g/dL   Hematocrit 36.3 34.0 - 46.6 %   MCV 86 79 - 97 fL   MCH 29.3 26.6 - 33.0 pg   MCHC 34.2 31.5 - 35.7 g/dL   RDW 12.0 11.7 - 15.4 %   Platelets 387 150 - 450 x10E3/uL   Neutrophils 61 Not Estab. %   Lymphs 28 Not Estab. %   Monocytes 7 Not Estab. %   Eos 3 Not Estab. %   Basos 1 Not Estab. %   Neutrophils Absolute 7.7 (H) 1.4 - 7.0 x10E3/uL   Lymphocytes Absolute 3.6 (H) 0.7 - 3.1 x10E3/uL   Monocytes Absolute 0.9 0.1 - 0.9 x10E3/uL   EOS (ABSOLUTE) 0.4 0.0 - 0.4 x10E3/uL   Basophils Absolute 0.1 0.0 - 0.2 x10E3/uL   Immature Granulocytes 0 Not Estab. %   Immature Grans (Abs) 0.0 0.0 - 0.1 x10E3/uL  Comprehensive metabolic panel  Result Value Ref Range   Glucose 92 65 - 99 mg/dL   BUN 15 6 - 24 mg/dL   Creatinine, Ser 0.75 0.57 - 1.00 mg/dL   GFR calc non Af Amer 94 >59  mL/min/1.73   GFR calc Af Amer 108 >59 mL/min/1.73   BUN/Creatinine Ratio 20 9 - 23   Sodium 135 134 - 144 mmol/L   Potassium 4.2 3.5 - 5.2 mmol/L   Chloride 99 96 - 106 mmol/L   CO2 21 20 - 29 mmol/L   Calcium 9.0 8.7 - 10.2 mg/dL   Total Protein 6.5 6.0 - 8.5 g/dL   Albumin 4.0 3.8 - 4.8 g/dL   Globulin, Total 2.5 1.5 - 4.5 g/dL   Albumin/Globulin Ratio 1.6 1.2 - 2.2   Bilirubin Total <0.2 0.0 - 1.2 mg/dL   Alkaline Phosphatase 92 39 - 117 IU/L   AST 25 0 - 40 IU/L   ALT 22 0 - 32 IU/L  Lipid Panel w/o Chol/HDL Ratio  Result Value Ref Range   Cholesterol, Total 200 (H) 100 - 199 mg/dL   Triglycerides 240 (H) 0 - 149 mg/dL   HDL 50 >39 mg/dL   VLDL Cholesterol Cal 48 (H) 5 - 40 mg/dL   LDL Calculated 102 (H) 0 - 99 mg/dL  TSH  Result Value Ref Range   TSH 2.860 0.450 - 4.500 uIU/mL  UA/M w/rflx Culture, Routine  Result Value Ref Range  Specific Gravity, UA 1.015 1.005 - 1.030   pH, UA 6.0 5.0 - 7.5   Color, UA Yellow Yellow   Appearance Ur Cloudy (A) Clear   Leukocytes, UA 2+ (A) Negative   Protein, UA Negative Negative/Trace   Glucose, UA Negative Negative   Ketones, UA Negative Negative   RBC, UA 3+ (A) Negative   Bilirubin, UA Negative Negative   Urobilinogen, Ur 0.2 0.2 - 1.0 mg/dL   Nitrite, UA Negative Negative   Microscopic Examination See below:    Urinalysis Reflex Comment       Assessment & Plan:   Problem List Items Addressed This Visit      Cardiovascular and Mediastinum   HTN (hypertension)    Stable and WNL, continue current regimen      Relevant Medications   olmesartan (BENICAR) 40 MG tablet   atenolol (TENORMIN) 25 MG tablet   amLODipine (NORVASC) 5 MG tablet    Other Visit Diagnoses    Perimenopause    -  Primary   Will refer to GYN for further management. Declines hormonal therapies   Relevant Orders   Ambulatory referral to Gynecology   Annual physical exam       Relevant Orders   CBC with Differential/Platelet (Completed)    Comprehensive metabolic panel (Completed)   Lipid Panel w/o Chol/HDL Ratio (Completed)   TSH (Completed)   UA/M w/rflx Culture, Routine (Completed)   Irregular bleeding       Relevant Orders   Ambulatory referral to Gynecology   Screening for breast cancer       Relevant Orders   MM DIGITAL SCREENING BILATERAL       Follow up plan: Return in about 6 months (around 05/23/2019) for BP.   LABORATORY TESTING:  - Pap smear: up to date  IMMUNIZATIONS:   - Tdap: Tetanus vaccination status reviewed: postponed. - Influenza: Refused  SCREENING: -Mammogram: Ordered today   PATIENT COUNSELING:   Advised to take 1 mg of folate supplement per day if capable of pregnancy.   Sexuality: Discussed sexually transmitted diseases, partner selection, use of condoms, avoidance of unintended pregnancy  and contraceptive alternatives.   Advised to avoid cigarette smoking.  I discussed with the patient that most people either abstain from alcohol or drink within safe limits (<=14/week and <=4 drinks/occasion for males, <=7/weeks and <= 3 drinks/occasion for females) and that the risk for alcohol disorders and other health effects rises proportionally with the number of drinks per week and how often a drinker exceeds daily limits.  Discussed cessation/primary prevention of drug use and availability of treatment for abuse.   Diet: Encouraged to adjust caloric intake to maintain  or achieve ideal body weight, to reduce intake of dietary saturated fat and total fat, to limit sodium intake by avoiding high sodium foods and not adding table salt, and to maintain adequate dietary potassium and calcium preferably from fresh fruits, vegetables, and low-fat dairy products.    stressed the importance of regular exercise  Injury prevention: Discussed safety belts, safety helmets, smoke detector, smoking near bedding or upholstery.   Dental health: Discussed importance of regular tooth brushing, flossing, and  dental visits.    NEXT PREVENTATIVE PHYSICAL DUE IN 1 YEAR. Return in about 6 months (around 05/23/2019) for BP.

## 2018-11-22 NOTE — Patient Instructions (Signed)
[facility] - [phone]    

## 2018-11-23 LAB — CBC WITH DIFFERENTIAL/PLATELET
BASOS ABS: 0.1 10*3/uL (ref 0.0–0.2)
Basos: 1 %
EOS (ABSOLUTE): 0.4 10*3/uL (ref 0.0–0.4)
Eos: 3 %
HEMOGLOBIN: 12.4 g/dL (ref 11.1–15.9)
Hematocrit: 36.3 % (ref 34.0–46.6)
IMMATURE GRANS (ABS): 0 10*3/uL (ref 0.0–0.1)
IMMATURE GRANULOCYTES: 0 %
LYMPHS: 28 %
Lymphocytes Absolute: 3.6 10*3/uL — ABNORMAL HIGH (ref 0.7–3.1)
MCH: 29.3 pg (ref 26.6–33.0)
MCHC: 34.2 g/dL (ref 31.5–35.7)
MCV: 86 fL (ref 79–97)
MONOCYTES: 7 %
Monocytes Absolute: 0.9 10*3/uL (ref 0.1–0.9)
NEUTROS ABS: 7.7 10*3/uL — AB (ref 1.4–7.0)
NEUTROS PCT: 61 %
PLATELETS: 387 10*3/uL (ref 150–450)
RBC: 4.23 x10E6/uL (ref 3.77–5.28)
RDW: 12 % (ref 11.7–15.4)
WBC: 12.7 10*3/uL — ABNORMAL HIGH (ref 3.4–10.8)

## 2018-11-23 LAB — COMPREHENSIVE METABOLIC PANEL
ALBUMIN: 4 g/dL (ref 3.8–4.8)
ALT: 22 IU/L (ref 0–32)
AST: 25 IU/L (ref 0–40)
Albumin/Globulin Ratio: 1.6 (ref 1.2–2.2)
Alkaline Phosphatase: 92 IU/L (ref 39–117)
BUN / CREAT RATIO: 20 (ref 9–23)
BUN: 15 mg/dL (ref 6–24)
Bilirubin Total: <0.2 mg/dL (ref 0.0–1.2)
CALCIUM: 9 mg/dL (ref 8.7–10.2)
CHLORIDE: 99 mmol/L (ref 96–106)
CO2: 21 mmol/L (ref 20–29)
CREATININE: 0.75 mg/dL (ref 0.57–1.00)
GFR, EST AFRICAN AMERICAN: 108 mL/min/{1.73_m2} (ref >59)
GFR, EST NON AFRICAN AMERICAN: 94 mL/min/{1.73_m2} (ref >59)
GLUCOSE: 92 mg/dL (ref 65–99)
Globulin, Total: 2.5 g/dL (ref 1.5–4.5)
Potassium: 4.2 mmol/L (ref 3.5–5.2)
Sodium: 135 mmol/L (ref 134–144)
TOTAL PROTEIN: 6.5 g/dL (ref 6.0–8.5)

## 2018-11-23 LAB — LIPID PANEL W/O CHOL/HDL RATIO
Cholesterol, Total: 200 mg/dL — ABNORMAL HIGH (ref 100–199)
HDL: 50 mg/dL (ref >39)
LDL CALC: 102 mg/dL — AB (ref 0–99)
Triglycerides: 240 mg/dL — ABNORMAL HIGH (ref 0–149)
VLDL CHOLESTEROL CAL: 48 mg/dL — AB (ref 5–40)

## 2018-11-23 LAB — TSH: TSH: 2.86 u[IU]/mL (ref 0.450–4.500)

## 2018-11-24 NOTE — Assessment & Plan Note (Signed)
Stable and WNL, continue current regimen 

## 2018-11-27 LAB — MICROSCOPIC EXAMINATION

## 2018-11-27 LAB — UA/M W/RFLX CULTURE, ROUTINE
BILIRUBIN UA: NEGATIVE
GLUCOSE, UA: NEGATIVE
KETONES UA: NEGATIVE
Nitrite, UA: NEGATIVE
Protein, UA: NEGATIVE
SPEC GRAV UA: 1.015 (ref 1.005–1.030)
Urobilinogen, Ur: 0.2 mg/dL (ref 0.2–1.0)
pH, UA: 6 (ref 5.0–7.5)

## 2018-11-27 LAB — URINE CULTURE, REFLEX

## 2018-11-29 ENCOUNTER — Other Ambulatory Visit: Payer: Self-pay | Admitting: Family Medicine

## 2018-11-29 MED ORDER — AMOXICILLIN 875 MG PO TABS: 875.0000 mg | ORAL_TABLET | Freq: Two times a day (BID) | ORAL | 0 refills | Status: DC

## 2018-12-07 ENCOUNTER — Ambulatory Visit (INDEPENDENT_AMBULATORY_CARE_PROVIDER_SITE_OTHER): Payer: 59 | Admitting: Obstetrics and Gynecology

## 2018-12-07 ENCOUNTER — Encounter: Payer: Self-pay | Admitting: Obstetrics and Gynecology

## 2018-12-07 ENCOUNTER — Other Ambulatory Visit (HOSPITAL_COMMUNITY)
Admission: RE | Admit: 2018-12-07 | Discharge: 2018-12-07 | Disposition: A | Discharge status: Home / Self Care | Payer: 59 | Source: Ambulatory Visit | Attending: Obstetrics and Gynecology | Admitting: Obstetrics and Gynecology

## 2018-12-07 VITALS — BP 132/88 | HR 89 | Ht 62.0 in | Wt 239.4 lb

## 2018-12-07 DIAGNOSIS — N951 Menopausal and female climacteric states: Secondary | ICD-10-CM | POA: Diagnosis not present

## 2018-12-07 DIAGNOSIS — N938 Other specified abnormal uterine and vaginal bleeding: Secondary | ICD-10-CM | POA: Insufficient documentation

## 2018-12-07 MED ORDER — NORETHINDRONE ACETATE 5 MG PO TABS: 5.0000 mg | ORAL_TABLET | Freq: Two times a day (BID) | ORAL | 0 refills | Status: DC

## 2018-12-07 NOTE — Progress Notes (Signed)
HPI:      [name] is a 48 y.o. No obstetric history on file. who LMP was No LMP recorded (lmp unknown).  Subjective:   She presents today with complaint of daily vaginal spotting.  She states that up until approximately a year and half ago she had normal regular menstrual cycles lasting 4 to 5 days.  Then her cycles became irregular now she is bleeding almost every day with some spotting.  She also complains of nightly hot flashes.  She briefly tried OCPs (2 weeks) but said it made her "crazy" and could not wait to stop them. She began having her menses at age 50 and had regular monthly cycles throughout her life.  She has taken OCPs for significant periods of time.  She has never been pregnant, by choice.  Her husband has a vasectomy for birth control.    Hx: The following portions of the patient's history were reviewed and updated as appropriate:             She  has a past medical history of Allergy, Bertolotti's syndrome, Fatty liver, Fibroid, and Ovarian cyst. She does not have any pertinent problems on file. She  has a past surgical history that includes Cholecystectomy; Spine surgery (ruptured disc); Nasal sinus surgery (X2); and Tonsillectomy and adenoidectomy (1985). Her family history includes Heart disease in her mother; Hypertension in her brother, brother, father, and paternal grandmother. She  reports that she has never smoked. She has never used smokeless tobacco. She reports that she does not drink alcohol or use drugs. She has a current medication list which includes the following prescription(s): amlodipine, amoxicillin, atenolol, olmesartan, and norethindrone. She is allergic to sulfa antibiotics and sulfur.       Review of Systems:  Review of Systems  Constitutional: Denied constitutional symptoms, night sweats, recent illness, fatigue, fever, insomnia and weight loss.  Eyes: Denied eye symptoms, eye pain, photophobia, vision change and visual disturbance.   Ears/Nose/Throat/Neck: Denied ear, nose, throat or neck symptoms, hearing loss, nasal discharge, sinus congestion and sore throat.  Cardiovascular: Denied cardiovascular symptoms, arrhythmia, chest pain/pressure, edema, exercise intolerance, orthopnea and palpitations.  Respiratory: Denied pulmonary symptoms, asthma, pleuritic pain, productive sputum, cough, dyspnea and wheezing.  Gastrointestinal: Denied, gastro-esophageal reflux, melena, nausea and vomiting.  Genitourinary: See HPI for additional information.  Musculoskeletal: Denied musculoskeletal symptoms, stiffness, swelling, muscle weakness and myalgia.  Dermatologic: Denied dermatology symptoms, rash and scar.  Neurologic: Denied neurology symptoms, dizziness, headache, neck pain and syncope.  Psychiatric: Denied psychiatric symptoms, anxiety and depression.  Endocrine: Denied endocrine symptoms including hot flashes and night sweats.   Meds:   Current Outpatient Medications on File Prior to Visit  Medication Sig Dispense Refill  . amLODipine (NORVASC) 5 MG tablet Take 1 tablet (5 mg total) by mouth daily. 90 tablet 1  . amoxicillin (AMOXIL) 875 MG tablet Take 1 tablet (875 mg total) by mouth 2 (two) times daily. 14 tablet 0  . atenolol (TENORMIN) 25 MG tablet Take 1 tablet (25 mg total) by mouth daily. 90 tablet 1  . olmesartan (BENICAR) 40 MG tablet Take 1 tablet (40 mg total) by mouth daily. 90 tablet 1   No current facility-administered medications on file prior to visit.     Objective:     Vitals:   12/07/18 0948  BP: 132/88  Pulse: 89              Physical examination   Pelvic:   Vulva: Normal appearance.  No lesions.  Vagina: No lesions or abnormalities noted.  Support: Normal pelvic support.  Urethra No masses tenderness or scarring.  Meatus Normal size without lesions or prolapse.  Cervix: Normal appearance.  No lesions.  Anus: Normal exam.  No lesions.  Perineum: Normal exam.  No lesions.        Bimanual    Uterus: Normal size.  Non-tender.  Mobile.  AV.  Adnexae: No masses.  Non-tender to palpation.  Cul-de-sac: Negative for abnormality.   Endometrial Biopsy After discussion with the patient regarding her abnormal uterine bleeding I recommended that she proceed with an endometrial biopsy for further diagnosis. The risks, benefits, alternatives, and indications for an endometrial biopsy were discussed with the patient in detail. She understood the risks including infection, bleeding, cervical laceration and uterine perforation.  Verbal consent was obtained.   PROCEDURE NOTE:  Vacurette endometrial biopsy was performed using aseptic technique with iodine preparation.  The uterus was sounded to a length of 8 cm.  Adequate sampling was not obtained with minimal blood loss.  The patient tolerated the procedure well.  Disposition will be pending pathology   Assessment:    No obstetric history on file. Patient Active Problem List   Diagnosis Date Noted  . Disorder of skin of trunk 03/18/2018  . Displacement of lumbar intervertebral disc without myelopathy 03/18/2018  . Inflammation of sacroiliac joint ([place]) 03/18/2018  . Low back pain 03/18/2018  . Vitamin D deficiency 05/15/2017  . DUB (dysfunctional uterine bleeding) 03/21/2016  . Left ovarian cyst 03/19/2016  . Abnormal bleeding in menstrual cycle 03/10/2016  . Left elbow tendonitis 11/28/2015  . Fatty liver 07/16/2015  . Migraine 07/16/2015  . HTN (hypertension) 03/09/2015  . Obesity 03/09/2015     1. Dysfunctional uterine bleeding   2. Symptomatic menopausal or female climacteric states     Possibly climacteric.  Must rule out endometrial hyperplasia.   Plan:            1.  Await biopsy results and discuss management at that time  2.  Progesterone to stop her bleeding  3.  FSH to evaluate for menopause  4.  Consider HRT, OCPs, IUD in the future. Orders Orders Placed This Encounter  Procedures  . Follicle stimulating  hormone     Meds ordered this encounter  Medications  . norethindrone (AYGESTIN) 5 MG tablet    Sig: Take 1 tablet (5 mg total) by mouth 2 (two) times daily for 25 days. As directed    Dispense:  50 tablet    Refill:  0      F/U  Return in about 1 week (around 12/14/2018). I spent 33 minutes involved in the care of this patient of which greater than 50% was spent discussing pre-and postmenopausal bleeding, dysfunctional uterine bleeding, causes and work-up, possible hormone replacement therapy versus OCPs versus IUD.  Signs and symptoms of menopause, pituitary gland and FSH.  All questions answered.  Finis Bud, M.D. 12/07/2018 10:45 AM

## 2018-12-07 NOTE — Progress Notes (Signed)
Patient comes in today for new Gyn appointment. She is having vaginal bleeding daily. Some days it is heavy with clots and then some days it is lite. Some abdominal cramping with the heavy bleeding.

## 2018-12-07 NOTE — Addendum Note (Signed)
Addended by: Durwin Glaze on: 12/07/2018 11:01 AM   Modules accepted: Orders

## 2018-12-08 LAB — FOLLICLE STIMULATING HORMONE: FSH: 1.2 m[IU]/mL

## 2018-12-14 ENCOUNTER — Ambulatory Visit (INDEPENDENT_AMBULATORY_CARE_PROVIDER_SITE_OTHER): Payer: 59 | Admitting: Obstetrics and Gynecology

## 2018-12-14 ENCOUNTER — Encounter: Payer: Self-pay | Admitting: Obstetrics and Gynecology

## 2018-12-14 ENCOUNTER — Other Ambulatory Visit: Payer: Self-pay

## 2018-12-14 VITALS — BP 118/78 | HR 78 | Ht 62.0 in | Wt 240.3 lb

## 2018-12-14 DIAGNOSIS — N840 Polyp of corpus uteri: Secondary | ICD-10-CM

## 2018-12-14 DIAGNOSIS — N938 Other specified abnormal uterine and vaginal bleeding: Secondary | ICD-10-CM

## 2018-12-14 MED ORDER — NORETHINDRONE ACETATE 5 MG PO TABS: 5.0000 mg | ORAL_TABLET | Freq: Two times a day (BID) | ORAL | 0 refills | Status: DC

## 2018-12-14 NOTE — Progress Notes (Signed)
HPI:      [name] is a 48 y.o. No obstetric history on file. who LMP was No LMP recorded (lmp unknown).  Subjective:   She presents today for follow-up of her endometrial biopsy and dysfunctional uterine bleeding.  She states that the Aygestin has controlled her bleeding.    Hx: The following portions of the patient's history were reviewed and updated as appropriate:             She  has a past medical history of Allergy, Bertolotti's syndrome, Fatty liver, Fibroid, and Ovarian cyst. She does not have any pertinent problems on file. She  has a past surgical history that includes Cholecystectomy; Spine surgery (ruptured disc); Nasal sinus surgery (X2); and Tonsillectomy and adenoidectomy (1985). Her family history includes Heart disease in her mother; Hypertension in her brother, brother, father, and paternal grandmother. She  reports that she has never smoked. She has never used smokeless tobacco. She reports that she does not drink alcohol or use drugs. She has a current medication list which includes the following prescription(s): amlodipine, atenolol, norethindrone, and olmesartan. She is allergic to sulfa antibiotics and sulfur.       Review of Systems:  Review of Systems  Constitutional: Denied constitutional symptoms, night sweats, recent illness, fatigue, fever, insomnia and weight loss.  Eyes: Denied eye symptoms, eye pain, photophobia, vision change and visual disturbance.  Ears/Nose/Throat/Neck: Denied ear, nose, throat or neck symptoms, hearing loss, nasal discharge, sinus congestion and sore throat.  Cardiovascular: Denied cardiovascular symptoms, arrhythmia, chest pain/pressure, edema, exercise intolerance, orthopnea and palpitations.  Respiratory: Denied pulmonary symptoms, asthma, pleuritic pain, productive sputum, cough, dyspnea and wheezing.  Gastrointestinal: Denied, gastro-esophageal reflux, melena, nausea and vomiting.  Genitourinary: Denied  genitourinary symptoms including symptomatic vaginal discharge, pelvic relaxation issues, and urinary complaints.  Musculoskeletal: Denied musculoskeletal symptoms, stiffness, swelling, muscle weakness and myalgia.  Dermatologic: Denied dermatology symptoms, rash and scar.  Neurologic: Denied neurology symptoms, dizziness, headache, neck pain and syncope.  Psychiatric: Denied psychiatric symptoms, anxiety and depression.  Endocrine: Denied endocrine symptoms including hot flashes and night sweats.   Meds:   Current Outpatient Medications on File Prior to Visit  Medication Sig Dispense Refill  . amLODipine (NORVASC) 5 MG tablet Take 1 tablet (5 mg total) by mouth daily. 90 tablet 1  . atenolol (TENORMIN) 25 MG tablet Take 1 tablet (25 mg total) by mouth daily. 90 tablet 1  . norethindrone (AYGESTIN) 5 MG tablet Take 1 tablet (5 mg total) by mouth 2 (two) times daily for 25 days. As directed 50 tablet 0  . olmesartan (BENICAR) 40 MG tablet Take 1 tablet (40 mg total) by mouth daily. 90 tablet 1   No current facility-administered medications on file prior to visit.     Objective:     Vitals:   12/14/18 1345  BP: 118/78  Pulse: 78              Endometrial biopsy results revealing polyp discussed in detail.  Results reviewed directly with the patient.  Assessment:    No obstetric history on file. Patient Active Problem List   Diagnosis Date Noted  . Disorder of skin of trunk 03/18/2018  . Displacement of lumbar intervertebral disc without myelopathy 03/18/2018  . Inflammation of sacroiliac joint ([place]) 03/18/2018  . Low back pain 03/18/2018  . Vitamin D deficiency 05/15/2017  . DUB (dysfunctional uterine bleeding) 03/21/2016  . Left ovarian cyst 03/19/2016  . Abnormal bleeding in menstrual cycle 03/10/2016  .  Left elbow tendonitis 11/28/2015  . Fatty liver 07/16/2015  . Migraine 07/16/2015  . HTN (hypertension) 03/09/2015  . Obesity 03/09/2015     1. Dysfunctional uterine  bleeding   2. Endometrial polyp        Plan:            1.  Discussed the necessity of D&C hysteroscopy for endometrial polyps and control of dysfunctional bleeding.  2.  Because her bleeding may also be secondary to uterine fibroids I have recommended that at the time of her surgery she have a Mirena IUD placed for bleeding/cycle control.  She has agreed to consider this as an option.   3.  She is currently concerned about having surgery at this time because of the COVID-19 virus and she will present for preop when she has decided upon a time for surgery.   4.  Will continue Aygestin as long as it continues to control her bleeding..   Orders No orders of the defined types were placed in this encounter.   No orders of the defined types were placed in this encounter.     F/U  Return in about 4 weeks (around 01/11/2019). I spent 17 minutes involved in the care of this patient of which greater than 50% was spent discussing endometrial biopsy, fibroids, endometrial polyps, FSH, menopausal symptoms, IUD use, possible surgery involving hysteroscopy.  All questions answered.  Finis Bud, M.D. 12/14/2018 2:15 PM

## 2019-01-10 ENCOUNTER — Telehealth: Payer: Self-pay | Admitting: Obstetrics and Gynecology

## 2019-01-10 NOTE — Telephone Encounter (Signed)
Patient called stating the medication she was given by dr Amalia Hailey was supposed to help control her bleeding however she is still bleeding heavily and cramping. She would like a call back.Thanks

## 2019-01-11 NOTE — Telephone Encounter (Signed)
Would you like to try something else?

## 2019-01-11 NOTE — Telephone Encounter (Signed)
Tele visit

## 2019-01-11 NOTE — Telephone Encounter (Signed)
Scheduled patient for a tele visit on Thursday with Dr. Amalia Hailey.

## 2019-01-13 ENCOUNTER — Encounter: Payer: Self-pay | Admitting: Obstetrics and Gynecology

## 2019-01-13 ENCOUNTER — Other Ambulatory Visit: Payer: Self-pay

## 2019-01-13 ENCOUNTER — Telehealth: Payer: Self-pay | Admitting: Family Medicine

## 2019-01-13 ENCOUNTER — Ambulatory Visit (INDEPENDENT_AMBULATORY_CARE_PROVIDER_SITE_OTHER): Payer: 59 | Admitting: Obstetrics and Gynecology

## 2019-01-13 VITALS — Ht 62.0 in | Wt 240.0 lb

## 2019-01-13 DIAGNOSIS — N840 Polyp of corpus uteri: Secondary | ICD-10-CM | POA: Diagnosis not present

## 2019-01-13 DIAGNOSIS — N938 Other specified abnormal uterine and vaginal bleeding: Secondary | ICD-10-CM

## 2019-01-13 MED ORDER — ATENOLOL 25 MG PO TABS: 25.0000 mg | ORAL_TABLET | Freq: Every day | ORAL | 1 refills | Status: DC

## 2019-01-13 NOTE — Telephone Encounter (Signed)
Thank you! Called pt and advised her that refill was sent. Pt understood.

## 2019-01-13 NOTE — Telephone Encounter (Signed)
Pt should have had plenty but re-sent since pharmacy didn't show refills

## 2019-01-13 NOTE — Telephone Encounter (Signed)
Copied from [place] [phone]. Topic: Quick Communication - See Telephone Encounter >> Jan 13, 2019  8:24 AM Ivar Drape wrote: CRM for notification. See Telephone encounter for: 01/13/19. Patient went to pick up her refill on her atenolol (TENORMIN) 25 MG tablet medication and her pharmacy ([facility] in [place] > [phone]) stated she doesn't have any refills on this medication.  Our records show that she has 1 refill of 90 tablets.

## 2019-01-13 NOTE — Progress Notes (Signed)
Virtual Visit via Telephone Note  I connected with Victoria Bowers on 01/13/19 at  2:15 PM EDT by telephone and verified that I am speaking with the correct person using two identifiers.   I discussed the limitations, risks, security and privacy concerns of performing an evaluation and management service by telephone and the availability of in person appointments. I also discussed with the patient that there may be a patient responsible charge related to this service. The patient expressed understanding and agreed to proceed.  Location of patient:  Home  Patient gave explicit verbal consent for telephone visit:  YES  Location of provider:  [facility] office  Persons other than physician and patient involved in provider conference:  None  History of Present Illness:    She has a history of uterine fibroids at least 1 of which is submucosal.  She has also had irregular bleeding and an endometrial biopsy revealed the possibility of an endometrial polyp as the source of her bleeding.  She was originally scheduled to have a D&C followed by intraoperative placement of IUD for management.(Patient declined hysterectomy-definitive surgery) Last week and she began having very significant cramping and bleeding and she says she passed an oblong fleshy tissue.  Since that time she is continued to bleed but not as heavily.  At the time of passage of the tissue she stopped her Aygestin.    Observations/Objective:   It sounds as if she may have passed the polyp or at least part of the polyp.   Assessment: Possible spontaneous passage of endometrial polyp.  Plan:   Resume Aygestin as directed and attempt to control menstrual periods until COVID-19 resolves.  Consider follow-up ultrasound or previous plan of D&C and IUD placement.     I discussed the assessment and treatment plan with the patient.  The patient agreed with the plan and demonstrated an understanding of the instructions.  She was given an  opportunity to ask questions and all of her questions were answered.   The patient was advised to call back or seek an in-person evaluation if the symptoms worsen or if the condition fails to improve as anticipated.  I provided 21 minutes of non-face-to-face time during this encounter.   Jeannie Fend, MD

## 2019-01-15 ENCOUNTER — Other Ambulatory Visit: Payer: Self-pay | Admitting: Obstetrics and Gynecology

## 2019-01-15 DIAGNOSIS — N938 Other specified abnormal uterine and vaginal bleeding: Secondary | ICD-10-CM

## 2019-02-01 ENCOUNTER — Encounter: Payer: 59 | Admitting: Obstetrics and Gynecology

## 2019-02-02 ENCOUNTER — Encounter: Payer: 59 | Admitting: Obstetrics and Gynecology

## 2019-02-28 ENCOUNTER — Encounter: Payer: Self-pay | Admitting: Obstetrics and Gynecology

## 2019-03-01 ENCOUNTER — Telehealth: Payer: Self-pay

## 2019-03-01 NOTE — Telephone Encounter (Signed)
Coronavirus (COVID-19) Are you at risk?  Are you at risk for the Coronavirus (COVID-19)?  To be considered HIGH RISK for Coronavirus (COVID-19), you have to meet the following criteria:  . Traveled to [place], [place], [place], [place] or [place]; or in the [place] to [place], [place], [place], or [place]; and have fever, cough, and shortness of breath within the last 2 weeks of travel OR . Been in close contact with a person diagnosed with COVID-19 within the last 2 weeks and have fever, cough, and shortness of breath . IF YOU DO NOT MEET THESE CRITERIA, YOU ARE CONSIDERED LOW RISK FOR COVID-19.  What to do if you are HIGH RISK for COVID-19?  . If you are having a medical emergency, call 911. . Seek medical care right away. Before you go to a doctor's office, urgent care or emergency department, call ahead and tell them about your recent travel, contact with someone diagnosed with COVID-19, and your symptoms. You should receive instructions from your physician's office regarding next steps of care.  . When you arrive at healthcare provider, tell the healthcare staff immediately you have returned from visiting [place], [place], [place], [place] or [place]; or traveled in the [place] to [place], [place], [place], or [place]; in the last two weeks or you have been in close contact with a person diagnosed with COVID-19 in the last 2 weeks.   . Tell the health care staff about your symptoms: fever, cough and shortness of breath. . After you have been seen by a medical provider, you will be either: o Tested for (COVID-19) and discharged home on quarantine except to seek medical care if symptoms worsen, and asked to  - Stay home and avoid contact with others until you get your results (4-5 days)  - Avoid travel on public transportation if possible (such as bus, train, or airplane) or o Sent to the Emergency Department by EMS for evaluation, COVID-19 testing, and possible  admission depending on your condition and test results.  What to do if you are LOW RISK for COVID-19?  Reduce your risk of any infection by using the same precautions used for avoiding the common cold or flu:  . Wash your hands often with soap and warm water for at least 20 seconds.  If soap and water are not readily available, use an alcohol-based hand sanitizer with at least 60% alcohol.  . If coughing or sneezing, cover your mouth and nose by coughing or sneezing into the elbow areas of your shirt or coat, into a tissue or into your sleeve (not your hands). . Avoid shaking hands with others and consider head nods or verbal greetings only. . Avoid touching your eyes, nose, or mouth with unwashed hands.  . Avoid close contact with people who are sick. . Avoid places or events with large numbers of people in one location, like concerts or sporting events. . Carefully consider travel plans you have or are making. . If you are planning any travel outside or inside the [place], visit the CDC's Travelers' Health webpage for the latest health notices. . If you have some symptoms but not all symptoms, continue to monitor at home and seek medical attention if your symptoms worsen. . If you are having a medical emergency, call 911.   ADDITIONAL HEALTHCARE OPTIONS FOR PATIENTS  [facility] / e-Visit: [email or web address]         [facility]: [phone]  [facility]: [phone]                   [facility]: [phone]   Prescreened. Neg .cm 

## 2019-03-02 ENCOUNTER — Encounter: Payer: Self-pay | Admitting: Obstetrics and Gynecology

## 2019-03-02 ENCOUNTER — Ambulatory Visit (INDEPENDENT_AMBULATORY_CARE_PROVIDER_SITE_OTHER): Payer: 59 | Admitting: Obstetrics and Gynecology

## 2019-03-02 ENCOUNTER — Other Ambulatory Visit: Payer: Self-pay

## 2019-03-02 VITALS — BP 138/61 | HR 72 | Ht 62.0 in | Wt 240.6 lb

## 2019-03-02 DIAGNOSIS — D219 Benign neoplasm of connective and other soft tissue, unspecified: Secondary | ICD-10-CM | POA: Diagnosis not present

## 2019-03-02 DIAGNOSIS — Z01818 Encounter for other preprocedural examination: Secondary | ICD-10-CM | POA: Diagnosis not present

## 2019-03-02 DIAGNOSIS — N840 Polyp of corpus uteri: Secondary | ICD-10-CM | POA: Diagnosis not present

## 2019-03-02 DIAGNOSIS — N938 Other specified abnormal uterine and vaginal bleeding: Secondary | ICD-10-CM | POA: Diagnosis not present

## 2019-03-02 NOTE — Progress Notes (Signed)
PRE-OPERATIVE HISTORY AND PHYSICAL EXAM  PCP:  Volney American, PA-C Subjective:   HPI:  Victoria Bowers is a 48 y.o. No obstetric history on file..  No LMP recorded (lmp unknown).  She presents today for a pre-op discussion and PE.  She has the following symptoms: Presence of uterine fibroids, endometrial polyp, breakthrough bleeding on progesterone.  Review of Systems:   Constitutional: Denied constitutional symptoms, night sweats, recent illness, fatigue, fever, insomnia and weight loss.  Eyes: Denied eye symptoms, eye pain, photophobia, vision change and visual disturbance.  Ears/Nose/Throat/Neck: Denied ear, nose, throat or neck symptoms, hearing loss, nasal discharge, sinus congestion and sore throat.  Cardiovascular: Denied cardiovascular symptoms, arrhythmia, chest pain/pressure, edema, exercise intolerance, orthopnea and palpitations.  Respiratory: Denied pulmonary symptoms, asthma, pleuritic pain, productive sputum, cough, dyspnea and wheezing.  Gastrointestinal: Denied, gastro-esophageal reflux, melena, nausea and vomiting.  Genitourinary: See HPI for additional information.  Musculoskeletal: Denied musculoskeletal symptoms, stiffness, swelling, muscle weakness and myalgia.  Dermatologic: Denied dermatology symptoms, rash and scar.  Neurologic: Denied neurology symptoms, dizziness, headache, neck pain and syncope.  Psychiatric: Denied psychiatric symptoms, anxiety and depression.  Endocrine: Denied endocrine symptoms including hot flashes and night sweats.   OB History  No obstetric history on file.    Past Medical History:  Diagnosis Date  . Allergy   . Bertolotti's syndrome   . Fatty liver   . Fibroid   . Ovarian cyst     Past Surgical History:  Procedure Laterality Date  . CHOLECYSTECTOMY    . NASAL SINUS SURGERY  X2  . SPINE SURGERY  ruptured disc  . TONSILLECTOMY AND ADENOIDECTOMY  1985      SOCIAL HISTORY: Social History   Tobacco Use   Smoking Status Never Smoker  Smokeless Tobacco Never Used   Social History   Substance and Sexual Activity  Alcohol Use No  . Alcohol/week: 0.0 standard drinks   Social History   Substance and Sexual Activity  Drug Use No    Family History  Problem Relation Age of Onset  . Hypertension Father   . Hypertension Brother   . Hypertension Paternal Grandmother   . Hypertension Brother   . Heart disease Mother     ALLERGIES:  Sulfa antibiotics and Sulfur  MEDS:   Current Outpatient Medications on File Prior to Visit  Medication Sig Dispense Refill  . amLODipine (NORVASC) 5 MG tablet Take 1 tablet (5 mg total) by mouth daily. 90 tablet 1  . atenolol (TENORMIN) 25 MG tablet Take 1 tablet (25 mg total) by mouth daily. 90 tablet 1  . olmesartan (BENICAR) 40 MG tablet Take 1 tablet (40 mg total) by mouth daily. 90 tablet 1  . norethindrone (AYGESTIN) 5 MG tablet TAKE 1 TABLET (5 MG TOTAL) BY MOUTH 2 (TWO) TIMES DAILY FOR 25 DAYS. AS DIRECTED 50 tablet 0   No current facility-administered medications on file prior to visit.     No orders of the defined types were placed in this encounter.    Physical examination BP 138/61   Pulse 72   Ht 5\' 2"  (1.575 m)   Wt 240 lb 9.6 oz (109.1 kg)   LMP  (LMP Unknown)   BMI 44.01 kg/m   General NAD, Conversant  HEENT Atraumatic; Op clear with mmm.  Normo-cephalic. Pupils reactive. Anicteric sclerae  Thyroid/Neck Smooth without nodularity or enlargement. Normal ROM.  Neck Supple.  Skin No rashes, lesions or ulceration. Normal palpated skin turgor. No  nodularity.  Breasts: No masses or discharge.  Symmetric.  No axillary adenopathy.  Lungs: Clear to auscultation.No rales or wheezes. Normal Respiratory effort, no retractions.  Heart: NSR.  No murmurs or rubs appreciated. No periferal edema  Abdomen: Soft.  Non-tender.  No masses.  No HSM. No hernia  Extremities: Moves all appropriately.  Normal ROM for age. No lymphadenopathy.  Neuro:  Oriented to PPT.  Normal mood. Normal affect.     Pelvic:   Vulva: Normal appearance.  No lesions.  Vagina: No lesions or abnormalities noted.  Support: Normal pelvic support.  Urethra No masses tenderness or scarring.  Meatus Normal size without lesions or prolapse.  Cervix: Normal ectropion.  No lesions.  Anus: Normal exam.  No lesions.  Perineum: Normal exam.  No lesions.        Bimanual   Uterus:  Enlarged.  Non-tender.  Mobile.  AV.  Adnexae: No masses.  Non-tender to palpation.  Cul-de-sac: Negative for abnormality.   Examination limited by patient body habitus  Assessment:   No obstetric history on file. Patient Active Problem List   Diagnosis Date Noted  . Disorder of skin of trunk 03/18/2018  . Displacement of lumbar intervertebral disc without myelopathy 03/18/2018  . Inflammation of sacroiliac joint ([place]) 03/18/2018  . Low back pain 03/18/2018  . Vitamin D deficiency 05/15/2017  . DUB (dysfunctional uterine bleeding) 03/21/2016  . Left ovarian cyst 03/19/2016  . Abnormal bleeding in menstrual cycle 03/10/2016  . Left elbow tendonitis 11/28/2015  . Fatty liver 07/16/2015  . Migraine 07/16/2015  . HTN (hypertension) 03/09/2015  . Obesity 03/09/2015    1. Dysfunctional uterine bleeding   2. Endometrial polyp   3. Fibroids   4. Preop examination      Plan:   Orders: No orders of the defined types were placed in this encounter.    1.  D&C hysteroscopy, IUD placement  Pre-op discussions regarding Risks and Benefits of her scheduled surgery.  D&C The procedure and the risks and benefits of dilation and curettage/evacuation have been explained to the patient.  The specific risks of bleeding, infection, anesthesia, uterine perforation, and damage to bowel or bladder  have been specifically discussed.  I have answered all of her questions and I believe that she has an adequate and informed understanding of this procedure.  Hysteroscopy also discussed in  detail. IUD placement discussed.  All questions answered.   Finis Bud, M.D. 03/02/2019 10:15 AM

## 2019-03-02 NOTE — H&P (View-Only) (Signed)
PRE-OPERATIVE HISTORY AND PHYSICAL EXAM  PCP:  Volney American, PA-C Subjective:   HPI:  Victoria Bowers is a 48 y.o. No obstetric history on file..  No LMP recorded (lmp unknown).  She presents today for a pre-op discussion and PE.  She has the following symptoms: Presence of uterine fibroids, endometrial polyp, breakthrough bleeding on progesterone.  Review of Systems:   Constitutional: Denied constitutional symptoms, night sweats, recent illness, fatigue, fever, insomnia and weight loss.  Eyes: Denied eye symptoms, eye pain, photophobia, vision change and visual disturbance.  Ears/Nose/Throat/Neck: Denied ear, nose, throat or neck symptoms, hearing loss, nasal discharge, sinus congestion and sore throat.  Cardiovascular: Denied cardiovascular symptoms, arrhythmia, chest pain/pressure, edema, exercise intolerance, orthopnea and palpitations.  Respiratory: Denied pulmonary symptoms, asthma, pleuritic pain, productive sputum, cough, dyspnea and wheezing.  Gastrointestinal: Denied, gastro-esophageal reflux, melena, nausea and vomiting.  Genitourinary: See HPI for additional information.  Musculoskeletal: Denied musculoskeletal symptoms, stiffness, swelling, muscle weakness and myalgia.  Dermatologic: Denied dermatology symptoms, rash and scar.  Neurologic: Denied neurology symptoms, dizziness, headache, neck pain and syncope.  Psychiatric: Denied psychiatric symptoms, anxiety and depression.  Endocrine: Denied endocrine symptoms including hot flashes and night sweats.   OB History  No obstetric history on file.    Past Medical History:  Diagnosis Date  . Allergy   . Bertolotti's syndrome   . Fatty liver   . Fibroid   . Ovarian cyst     Past Surgical History:  Procedure Laterality Date  . CHOLECYSTECTOMY    . NASAL SINUS SURGERY  X2  . SPINE SURGERY  ruptured disc  . TONSILLECTOMY AND ADENOIDECTOMY  1985      SOCIAL HISTORY: Social History   Tobacco Use   Smoking Status Never Smoker  Smokeless Tobacco Never Used   Social History   Substance and Sexual Activity  Alcohol Use No  . Alcohol/week: 0.0 standard drinks   Social History   Substance and Sexual Activity  Drug Use No    Family History  Problem Relation Age of Onset  . Hypertension Father   . Hypertension Brother   . Hypertension Paternal Grandmother   . Hypertension Brother   . Heart disease Mother     ALLERGIES:  Sulfa antibiotics and Sulfur  MEDS:   Current Outpatient Medications on File Prior to Visit  Medication Sig Dispense Refill  . amLODipine (NORVASC) 5 MG tablet Take 1 tablet (5 mg total) by mouth daily. 90 tablet 1  . atenolol (TENORMIN) 25 MG tablet Take 1 tablet (25 mg total) by mouth daily. 90 tablet 1  . olmesartan (BENICAR) 40 MG tablet Take 1 tablet (40 mg total) by mouth daily. 90 tablet 1  . norethindrone (AYGESTIN) 5 MG tablet TAKE 1 TABLET (5 MG TOTAL) BY MOUTH 2 (TWO) TIMES DAILY FOR 25 DAYS. AS DIRECTED 50 tablet 0   No current facility-administered medications on file prior to visit.     No orders of the defined types were placed in this encounter.    Physical examination BP 138/61   Pulse 72   Ht 5\' 2"  (1.575 m)   Wt 240 lb 9.6 oz (109.1 kg)   LMP  (LMP Unknown)   BMI 44.01 kg/m   General NAD, Conversant  HEENT Atraumatic; Op clear with mmm.  Normo-cephalic. Pupils reactive. Anicteric sclerae  Thyroid/Neck Smooth without nodularity or enlargement. Normal ROM.  Neck Supple.  Skin No rashes, lesions or ulceration. Normal palpated skin turgor. No  nodularity.  Breasts: No masses or discharge.  Symmetric.  No axillary adenopathy.  Lungs: Clear to auscultation.No rales or wheezes. Normal Respiratory effort, no retractions.  Heart: NSR.  No murmurs or rubs appreciated. No periferal edema  Abdomen: Soft.  Non-tender.  No masses.  No HSM. No hernia  Extremities: Moves all appropriately.  Normal ROM for age. No lymphadenopathy.  Neuro:  Oriented to PPT.  Normal mood. Normal affect.     Pelvic:   Vulva: Normal appearance.  No lesions.  Vagina: No lesions or abnormalities noted.  Support: Normal pelvic support.  Urethra No masses tenderness or scarring.  Meatus Normal size without lesions or prolapse.  Cervix: Normal ectropion.  No lesions.  Anus: Normal exam.  No lesions.  Perineum: Normal exam.  No lesions.        Bimanual   Uterus:  Enlarged.  Non-tender.  Mobile.  AV.  Adnexae: No masses.  Non-tender to palpation.  Cul-de-sac: Negative for abnormality.   Examination limited by patient body habitus  Assessment:   No obstetric history on file. Patient Active Problem List   Diagnosis Date Noted  . Disorder of skin of trunk 03/18/2018  . Displacement of lumbar intervertebral disc without myelopathy 03/18/2018  . Inflammation of sacroiliac joint ([place]) 03/18/2018  . Low back pain 03/18/2018  . Vitamin D deficiency 05/15/2017  . DUB (dysfunctional uterine bleeding) 03/21/2016  . Left ovarian cyst 03/19/2016  . Abnormal bleeding in menstrual cycle 03/10/2016  . Left elbow tendonitis 11/28/2015  . Fatty liver 07/16/2015  . Migraine 07/16/2015  . HTN (hypertension) 03/09/2015  . Obesity 03/09/2015    1. Dysfunctional uterine bleeding   2. Endometrial polyp   3. Fibroids   4. Preop examination      Plan:   Orders: No orders of the defined types were placed in this encounter.    1.  D&C hysteroscopy, IUD placement  Pre-op discussions regarding Risks and Benefits of her scheduled surgery.  D&C The procedure and the risks and benefits of dilation and curettage/evacuation have been explained to the patient.  The specific risks of bleeding, infection, anesthesia, uterine perforation, and damage to bowel or bladder  have been specifically discussed.  I have answered all of her questions and I believe that she has an adequate and informed understanding of this procedure.  Hysteroscopy also discussed in  detail. IUD placement discussed.  All questions answered.

## 2019-03-02 NOTE — H&P (Signed)
PRE-OPERATIVE HISTORY AND PHYSICAL EXAM  PCP:  Volney American, PA-C Subjective:   HPI:  Victoria Bowers is a 48 y.o. No obstetric history on file..  No LMP recorded (lmp unknown).  She presents today for a pre-op discussion and PE.  She has the following symptoms: Presence of uterine fibroids, endometrial polyp, breakthrough bleeding on progesterone.  Review of Systems:   Constitutional: Denied constitutional symptoms, night sweats, recent illness, fatigue, fever, insomnia and weight loss.  Eyes: Denied eye symptoms, eye pain, photophobia, vision change and visual disturbance.  Ears/Nose/Throat/Neck: Denied ear, nose, throat or neck symptoms, hearing loss, nasal discharge, sinus congestion and sore throat.  Cardiovascular: Denied cardiovascular symptoms, arrhythmia, chest pain/pressure, edema, exercise intolerance, orthopnea and palpitations.  Respiratory: Denied pulmonary symptoms, asthma, pleuritic pain, productive sputum, cough, dyspnea and wheezing.  Gastrointestinal: Denied, gastro-esophageal reflux, melena, nausea and vomiting.  Genitourinary: See HPI for additional information.  Musculoskeletal: Denied musculoskeletal symptoms, stiffness, swelling, muscle weakness and myalgia.  Dermatologic: Denied dermatology symptoms, rash and scar.  Neurologic: Denied neurology symptoms, dizziness, headache, neck pain and syncope.  Psychiatric: Denied psychiatric symptoms, anxiety and depression.  Endocrine: Denied endocrine symptoms including hot flashes and night sweats.   OB History  No obstetric history on file.    Past Medical History:  Diagnosis Date  . Allergy   . Bertolotti's syndrome   . Fatty liver   . Fibroid   . Ovarian cyst     Past Surgical History:  Procedure Laterality Date  . CHOLECYSTECTOMY    . NASAL SINUS SURGERY  X2  . SPINE SURGERY  ruptured disc  . TONSILLECTOMY AND ADENOIDECTOMY  1985      SOCIAL HISTORY: Social History   Tobacco Use   Smoking Status Never Smoker  Smokeless Tobacco Never Used   Social History   Substance and Sexual Activity  Alcohol Use No  . Alcohol/week: 0.0 standard drinks   Social History   Substance and Sexual Activity  Drug Use No    Family History  Problem Relation Age of Onset  . Hypertension Father   . Hypertension Brother   . Hypertension Paternal Grandmother   . Hypertension Brother   . Heart disease Mother     ALLERGIES:  Sulfa antibiotics and Sulfur  MEDS:   Current Outpatient Medications on File Prior to Visit  Medication Sig Dispense Refill  . amLODipine (NORVASC) 5 MG tablet Take 1 tablet (5 mg total) by mouth daily. 90 tablet 1  . atenolol (TENORMIN) 25 MG tablet Take 1 tablet (25 mg total) by mouth daily. 90 tablet 1  . olmesartan (BENICAR) 40 MG tablet Take 1 tablet (40 mg total) by mouth daily. 90 tablet 1  . norethindrone (AYGESTIN) 5 MG tablet TAKE 1 TABLET (5 MG TOTAL) BY MOUTH 2 (TWO) TIMES DAILY FOR 25 DAYS. AS DIRECTED 50 tablet 0   No current facility-administered medications on file prior to visit.     No orders of the defined types were placed in this encounter.    Physical examination BP 138/61   Pulse 72   Ht 5\' 2"  (1.575 m)   Wt 240 lb 9.6 oz (109.1 kg)   LMP  (LMP Unknown)   BMI 44.01 kg/m   General NAD, Conversant  HEENT Atraumatic; Op clear with mmm.  Normo-cephalic. Pupils reactive. Anicteric sclerae  Thyroid/Neck Smooth without nodularity or enlargement. Normal ROM.  Neck Supple.  Skin No rashes, lesions or ulceration. Normal palpated skin turgor. No  nodularity.  Breasts: No masses or discharge.  Symmetric.  No axillary adenopathy.  Lungs: Clear to auscultation.No rales or wheezes. Normal Respiratory effort, no retractions.  Heart: NSR.  No murmurs or rubs appreciated. No periferal edema  Abdomen: Soft.  Non-tender.  No masses.  No HSM. No hernia  Extremities: Moves all appropriately.  Normal ROM for age. No lymphadenopathy.  Neuro:  Oriented to PPT.  Normal mood. Normal affect.     Pelvic:   Vulva: Normal appearance.  No lesions.  Vagina: No lesions or abnormalities noted.  Support: Normal pelvic support.  Urethra No masses tenderness or scarring.  Meatus Normal size without lesions or prolapse.  Cervix: Normal ectropion.  No lesions.  Anus: Normal exam.  No lesions.  Perineum: Normal exam.  No lesions.        Bimanual   Uterus:  Enlarged.  Non-tender.  Mobile.  AV.  Adnexae: No masses.  Non-tender to palpation.  Cul-de-sac: Negative for abnormality.   Examination limited by patient body habitus  Assessment:   No obstetric history on file. Patient Active Problem List   Diagnosis Date Noted  . Disorder of skin of trunk 03/18/2018  . Displacement of lumbar intervertebral disc without myelopathy 03/18/2018  . Inflammation of sacroiliac joint ([place]) 03/18/2018  . Low back pain 03/18/2018  . Vitamin D deficiency 05/15/2017  . DUB (dysfunctional uterine bleeding) 03/21/2016  . Left ovarian cyst 03/19/2016  . Abnormal bleeding in menstrual cycle 03/10/2016  . Left elbow tendonitis 11/28/2015  . Fatty liver 07/16/2015  . Migraine 07/16/2015  . HTN (hypertension) 03/09/2015  . Obesity 03/09/2015    1. Dysfunctional uterine bleeding   2. Endometrial polyp   3. Fibroids   4. Preop examination      Plan:   Orders: No orders of the defined types were placed in this encounter.    1.  D&C hysteroscopy, IUD placement  Pre-op discussions regarding Risks and Benefits of her scheduled surgery.  D&C The procedure and the risks and benefits of dilation and curettage/evacuation have been explained to the patient.  The specific risks of bleeding, infection, anesthesia, uterine perforation, and damage to bowel or bladder  have been specifically discussed.  I have answered all of her questions and I believe that she has an adequate and informed understanding of this procedure.  Hysteroscopy also discussed in  detail. IUD placement discussed.  All questions answered.

## 2019-03-02 NOTE — Progress Notes (Signed)
Patient comes in today for preop appointment. She has no new concerns at this time.

## 2019-03-07 ENCOUNTER — Other Ambulatory Visit: Payer: Self-pay | Admitting: Obstetrics and Gynecology

## 2019-03-07 DIAGNOSIS — N938 Other specified abnormal uterine and vaginal bleeding: Secondary | ICD-10-CM

## 2019-03-10 ENCOUNTER — Telehealth: Payer: Self-pay | Admitting: Obstetrics and Gynecology

## 2019-03-10 DIAGNOSIS — N938 Other specified abnormal uterine and vaginal bleeding: Secondary | ICD-10-CM

## 2019-03-10 MED ORDER — NORETHINDRONE ACETATE 5 MG PO TABS: 5.0000 mg | ORAL_TABLET | Freq: Two times a day (BID) | ORAL | 0 refills | Status: DC

## 2019-03-10 NOTE — Telephone Encounter (Signed)
Pt pharmacy sent refill request.    [facility] [place] [facility] Norethindrone refill.   Pls advise.

## 2019-03-10 NOTE — Telephone Encounter (Signed)
OK to refill medication 

## 2019-03-10 NOTE — Telephone Encounter (Signed)
Notified patient that prescription has been sent to pharmacy.  

## 2019-03-23 ENCOUNTER — Other Ambulatory Visit: Payer: Self-pay

## 2019-03-23 ENCOUNTER — Encounter
Admission: RE | Admit: 2019-03-23 | Discharge: 2019-03-23 | Disposition: A | Discharge status: Home / Self Care | Payer: 59 | Source: Ambulatory Visit | Attending: Obstetrics and Gynecology | Admitting: Obstetrics and Gynecology

## 2019-03-23 HISTORY — DX: Other specified postprocedural states: Z98.890

## 2019-03-23 HISTORY — DX: Gastro-esophageal reflux disease without esophagitis: K21.9

## 2019-03-23 HISTORY — DX: Nausea with vomiting, unspecified: R11.2

## 2019-03-23 HISTORY — DX: Other complications of anesthesia, initial encounter: T88.59XA

## 2019-03-23 NOTE — Patient Instructions (Addendum)
Your procedure is scheduled on: 03-28-19 [facility] Report to Same Day Surgery 2nd floor medical mall [facility] Entrance-take elevator on left to 2nd floor.  Check in with surgery information desk.) To find out your arrival time please call [phone] between 1PM - 3PM on 03-25-19 FRIDAY  Remember: Instructions that are not followed completely may result in serious medical risk, up to and including death, or upon the discretion of your surgeon and anesthesiologist your surgery may need to be rescheduled.    _x___ 1. Do not eat food after midnight the night before your procedure. NO GUM OR CANDY AFTER MIDNIGHT. You may drink clear liquids up to 2 hours before you are scheduled to arrive at the hospital for your procedure.  Do not drink clear liquids within 2 hours of your scheduled arrival to the hospital.  Clear liquids include  --Water or Apple juice without pulp  --Clear carbohydrate beverage such as ClearFast or Gatorade  --Black Coffee or Clear Tea (No milk, no creamers, do not add anything to the coffee or Tea   ____Ensure clear carbohydrate drink on the way to the hospital for bariatric patients  _X___Ensure clear carbohydrate drink 3 hours before surgery      __x__ 2. No Alcohol for 24 hours before or after surgery.   __x__3. No Smoking or e-cigarettes for 24 prior to surgery.  Do not use any chewable tobacco products for at least 6 hour prior to surgery   ____  4. Bring all medications with you on the day of surgery if instructed.    __x__ 5. Notify your doctor if there is any change in your medical condition     (cold, fever, infections).    x___6. On the morning of surgery brush your teeth with toothpaste and water.  You may rinse your mouth with mouth wash if you wish.  Do not swallow any toothpaste or mouthwash.   Do not wear jewelry, make-up, hairpins, clips or nail polish.  Do not wear lotions, powders, or perfumes. You may wear deodorant.  Do not shave 48 hours prior  to surgery. Men may shave face and neck.  Do not bring valuables to the hospital.    [facility] is not responsible for any belongings or valuables.               Contacts, dentures or bridgework may not be worn into surgery.  Leave your suitcase in the car. After surgery it may be brought to your room.  For patients admitted to the hospital, discharge time is determined by your treatment team.  _  Patients discharged the day of surgery will not be allowed to drive home.  You will need someone to drive you home and stay with you the night of your procedure.    Please read over the following fact sheets that you were given:   [facility] Preparing for Surgery  _x___ TAKE THE FOLLOWING MEDICATION THE MORNING OF SURGERY WITH A SMALL SIP OF WATER. These include:  1. AMLODIPINE   2. ATENOLOL  3.  4.  5.  6.  ____Fleets enema or Magnesium Citrate as directed.   ____ Use CHG Soap or sage wipes as directed on instruction sheet   ____ Use inhalers on the day of surgery and bring to hospital day of surgery  ____ Stop Metformin and Janumet 2 days prior to surgery.    ____ Take 1/2 of usual insulin dose the night before surgery and none on the morning  surgery.   ____ Follow recommendations from Cardiologist, Pulmonologist or PCP regarding stopping Aspirin, Coumadin, Plavix ,Eliquis, Effient, or Pradaxa, and Pletal.  X____Stop Anti-inflammatories such as Advil, Aleve, Ibuprofen, Motrin, Naproxen, Naprosyn, Goodies powders or aspirin products NOW-OK to take Tylenol    ____ Stop supplements until after surgery.     ____ Bring C-Pap to the hospital.

## 2019-03-24 ENCOUNTER — Encounter
Admission: RE | Admit: 2019-03-24 | Discharge: 2019-03-24 | Disposition: A | Discharge status: Home / Self Care | Payer: 59 | Source: Ambulatory Visit | Attending: Obstetrics and Gynecology | Admitting: Obstetrics and Gynecology

## 2019-03-24 ENCOUNTER — Other Ambulatory Visit: Payer: Self-pay

## 2019-03-24 ENCOUNTER — Other Ambulatory Visit: Admission: RE | Admit: 2019-03-24 | Payer: 59 | Source: Ambulatory Visit

## 2019-03-24 DIAGNOSIS — Z01818 Encounter for other preprocedural examination: Secondary | ICD-10-CM | POA: Insufficient documentation

## 2019-03-24 DIAGNOSIS — Z1159 Encounter for screening for other viral diseases: Secondary | ICD-10-CM | POA: Insufficient documentation

## 2019-03-24 LAB — CBC
HCT: 40.5 % (ref 36.0–46.0)
Hemoglobin: 13.4 g/dL (ref 12.0–15.0)
MCH: 28.6 pg (ref 26.0–34.0)
MCHC: 33.1 g/dL (ref 30.0–36.0)
MCV: 86.5 fL (ref 80.0–100.0)
Platelets: 402 10*3/uL — ABNORMAL HIGH (ref 150–400)
RBC: 4.68 MIL/uL (ref 3.87–5.11)
RDW: 13.2 % (ref 11.5–15.5)
WBC: 9.2 10*3/uL (ref 4.0–10.5)
nRBC: 0 % (ref 0.0–0.2)

## 2019-03-24 LAB — TYPE AND SCREEN
ABO/RH(D): A POS
Antibody Screen: NEGATIVE

## 2019-03-24 LAB — HCG, QUANTITATIVE, PREGNANCY: hCG, Beta Chain, Quant, S: <1 m[IU]/mL (ref <5)

## 2019-03-24 NOTE — Pre-Procedure Instructions (Signed)
MESSAGED DR PISCITELLO REGARDING ABNORMAL EKG. HE COMPARED IT TO THE ONE IN Epic FROM 2017 AND AS LONG AS PT WAS NOT HAVING ANY CARDIAC SYMPTOMS OK TO PROCEED. NO CARDIAC SYMPTOMS DURING PHONE INTERVIEW

## 2019-03-25 LAB — NOVEL CORONAVIRUS, NAA (HOSP ORDER, SEND-OUT TO REF LAB; TAT 18-24 HRS): SARS-CoV-2, NAA: NOT DETECTED

## 2019-03-27 ENCOUNTER — Other Ambulatory Visit: Payer: Self-pay | Admitting: Obstetrics and Gynecology

## 2019-03-27 DIAGNOSIS — N938 Other specified abnormal uterine and vaginal bleeding: Secondary | ICD-10-CM

## 2019-03-28 ENCOUNTER — Encounter
Admission: RE | Disposition: A | Discharge status: Home / Self Care | Payer: Self-pay | Source: Home / Self Care | Attending: Obstetrics and Gynecology

## 2019-03-28 ENCOUNTER — Ambulatory Visit: Payer: 59 | Admitting: Anesthesiology

## 2019-03-28 ENCOUNTER — Ambulatory Visit
Admission: RE | Admit: 2019-03-28 | Discharge: 2019-03-28 | Disposition: A | Discharge status: Home / Self Care | Payer: 59 | Attending: Obstetrics and Gynecology | Admitting: Obstetrics and Gynecology

## 2019-03-28 ENCOUNTER — Encounter: Payer: Self-pay | Admitting: *Deleted

## 2019-03-28 ENCOUNTER — Other Ambulatory Visit: Payer: Self-pay

## 2019-03-28 DIAGNOSIS — N83209 Unspecified ovarian cyst, unspecified side: Secondary | ICD-10-CM | POA: Insufficient documentation

## 2019-03-28 DIAGNOSIS — M5126 Other intervertebral disc displacement, lumbar region: Secondary | ICD-10-CM | POA: Diagnosis not present

## 2019-03-28 DIAGNOSIS — Z8249 Family history of ischemic heart disease and other diseases of the circulatory system: Secondary | ICD-10-CM | POA: Insufficient documentation

## 2019-03-28 DIAGNOSIS — N938 Other specified abnormal uterine and vaginal bleeding: Secondary | ICD-10-CM | POA: Insufficient documentation

## 2019-03-28 DIAGNOSIS — Z79899 Other long term (current) drug therapy: Secondary | ICD-10-CM | POA: Diagnosis not present

## 2019-03-28 DIAGNOSIS — I10 Essential (primary) hypertension: Secondary | ICD-10-CM | POA: Insufficient documentation

## 2019-03-28 DIAGNOSIS — Z882 Allergy status to sulfonamides status: Secondary | ICD-10-CM | POA: Insufficient documentation

## 2019-03-28 DIAGNOSIS — Z9049 Acquired absence of other specified parts of digestive tract: Secondary | ICD-10-CM | POA: Insufficient documentation

## 2019-03-28 DIAGNOSIS — K760 Fatty (change of) liver, not elsewhere classified: Secondary | ICD-10-CM | POA: Insufficient documentation

## 2019-03-28 DIAGNOSIS — Z793 Long term (current) use of hormonal contraceptives: Secondary | ICD-10-CM | POA: Diagnosis not present

## 2019-03-28 DIAGNOSIS — D259 Leiomyoma of uterus, unspecified: Secondary | ICD-10-CM | POA: Diagnosis not present

## 2019-03-28 DIAGNOSIS — Q7649 Other congenital malformations of spine, not associated with scoliosis: Secondary | ICD-10-CM | POA: Insufficient documentation

## 2019-03-28 DIAGNOSIS — N840 Polyp of corpus uteri: Secondary | ICD-10-CM | POA: Insufficient documentation

## 2019-03-28 DIAGNOSIS — Z30430 Encounter for insertion of intrauterine contraceptive device: Secondary | ICD-10-CM

## 2019-03-28 HISTORY — PX: HYSTEROSCOPY WITH D & C: SHX1775

## 2019-03-28 HISTORY — PX: INTRAUTERINE DEVICE (IUD) INSERTION: SHX5877

## 2019-03-28 LAB — ABO/RH: ABO/RH(D): A POS

## 2019-03-28 LAB — POCT PREGNANCY, URINE: Preg Test, Ur: NEGATIVE

## 2019-03-28 SURGERY — DILATATION AND CURETTAGE /HYSTEROSCOPY
Anesthesia: General

## 2019-03-28 MED ORDER — KETOROLAC TROMETHAMINE 30 MG/ML IJ SOLN
30.0000 mg | Freq: Once | INTRAMUSCULAR | Status: AC
  Administered 2019-03-28: 30 mg via INTRAVENOUS

## 2019-03-28 MED ORDER — PROMETHAZINE HCL 25 MG/ML IJ SOLN
INTRAMUSCULAR | Status: AC
  Filled 2019-03-28: qty 1

## 2019-03-28 MED ORDER — LIDOCAINE HCL (PF) 2 % IJ SOLN
INTRAMUSCULAR | Status: AC
  Filled 2019-03-28: qty 10

## 2019-03-28 MED ORDER — PROMETHAZINE HCL 25 MG/ML IJ SOLN
6.2500 mg | Freq: Four times a day (QID) | INTRAMUSCULAR | Status: DC | PRN
  Administered 2019-03-28: 6.25 mg via INTRAVENOUS

## 2019-03-28 MED ORDER — FENTANYL CITRATE (PF) 100 MCG/2ML IJ SOLN
INTRAMUSCULAR | Status: AC
  Administered 2019-03-28: 25 ug via INTRAVENOUS
  Filled 2019-03-28: qty 2

## 2019-03-28 MED ORDER — ONDANSETRON 4 MG PO TBDP: 4.0000 mg | ORAL_TABLET | Freq: Four times a day (QID) | ORAL | Status: DC | PRN

## 2019-03-28 MED ORDER — MIDAZOLAM HCL 2 MG/2ML IJ SOLN
INTRAMUSCULAR | Status: AC
  Filled 2019-03-28: qty 2

## 2019-03-28 MED ORDER — ROCURONIUM BROMIDE 50 MG/5ML IV SOLN
INTRAVENOUS | Status: AC
  Filled 2019-03-28: qty 1

## 2019-03-28 MED ORDER — LACTATED RINGERS IV SOLN
INTRAVENOUS | Status: DC
  Administered 2019-03-28: 07:00:00 via INTRAVENOUS

## 2019-03-28 MED ORDER — MIDAZOLAM HCL 2 MG/2ML IJ SOLN
INTRAMUSCULAR | Status: DC | PRN
  Administered 2019-03-28: 2 mg via INTRAVENOUS

## 2019-03-28 MED ORDER — ONDANSETRON HCL 4 MG/2ML IJ SOLN
4.0000 mg | Freq: Once | INTRAMUSCULAR | Status: AC | PRN
  Administered 2019-03-28: 11:00:00 4 mg via INTRAVENOUS

## 2019-03-28 MED ORDER — ONDANSETRON HCL 4 MG/2ML IJ SOLN
INTRAMUSCULAR | Status: AC
  Filled 2019-03-28: qty 2

## 2019-03-28 MED ORDER — LACTATED RINGERS IV SOLN
INTRAVENOUS | Status: DC | PRN
  Administered 2019-03-28: 10:00:00 via INTRAVENOUS

## 2019-03-28 MED ORDER — FENTANYL CITRATE (PF) 100 MCG/2ML IJ SOLN
INTRAMUSCULAR | Status: AC
  Filled 2019-03-28: qty 2

## 2019-03-28 MED ORDER — KETOROLAC TROMETHAMINE 30 MG/ML IJ SOLN
INTRAMUSCULAR | Status: AC
  Filled 2019-03-28: qty 1

## 2019-03-28 MED ORDER — FENTANYL CITRATE (PF) 100 MCG/2ML IJ SOLN
INTRAMUSCULAR | Status: DC | PRN
  Administered 2019-03-28 (×2): 50 ug via INTRAVENOUS

## 2019-03-28 MED ORDER — SUCCINYLCHOLINE CHLORIDE 20 MG/ML IJ SOLN
INTRAMUSCULAR | Status: AC
  Filled 2019-03-28: qty 1

## 2019-03-28 MED ORDER — DEXMEDETOMIDINE HCL 200 MCG/2ML IV SOLN
INTRAVENOUS | Status: DC | PRN
  Administered 2019-03-28: 12 ug via INTRAVENOUS

## 2019-03-28 MED ORDER — PHENYLEPHRINE HCL (PRESSORS) 10 MG/ML IV SOLN
INTRAVENOUS | Status: DC | PRN
  Administered 2019-03-28: 100 ug via INTRAVENOUS

## 2019-03-28 MED ORDER — PROPOFOL 10 MG/ML IV BOLUS
INTRAVENOUS | Status: AC
  Filled 2019-03-28: qty 20

## 2019-03-28 MED ORDER — LACTATED RINGERS IV SOLN: INTRAVENOUS | Status: DC

## 2019-03-28 MED ORDER — FENTANYL CITRATE (PF) 100 MCG/2ML IJ SOLN
25.0000 ug | INTRAMUSCULAR | Status: DC | PRN
  Administered 2019-03-28 (×4): 25 ug via INTRAVENOUS

## 2019-03-28 MED ORDER — FAMOTIDINE 20 MG PO TABS
ORAL_TABLET | ORAL | Status: AC
  Administered 2019-03-28: 20 mg via ORAL
  Filled 2019-03-28: qty 1

## 2019-03-28 MED ORDER — DEXTROSE IN LACTATED RINGERS 5 % IV SOLN: INTRAVENOUS | Status: DC

## 2019-03-28 MED ORDER — FAMOTIDINE 20 MG PO TABS
20.0000 mg | ORAL_TABLET | Freq: Once | ORAL | Status: AC
  Administered 2019-03-28: 07:00:00 20 mg via ORAL

## 2019-03-28 MED ORDER — PROPOFOL 10 MG/ML IV BOLUS
INTRAVENOUS | Status: DC | PRN
  Administered 2019-03-28: 180 mg via INTRAVENOUS

## 2019-03-28 MED ORDER — LIDOCAINE HCL (CARDIAC) PF 100 MG/5ML IV SOSY
PREFILLED_SYRINGE | INTRAVENOUS | Status: DC | PRN
  Administered 2019-03-28: 80 mg via INTRAVENOUS

## 2019-03-28 MED ORDER — DEXAMETHASONE SODIUM PHOSPHATE 10 MG/ML IJ SOLN
INTRAMUSCULAR | Status: DC | PRN
  Administered 2019-03-28: 10 mg via INTRAVENOUS

## 2019-03-28 MED ORDER — ONDANSETRON HCL 4 MG/2ML IJ SOLN: 4.0000 mg | Freq: Four times a day (QID) | INTRAMUSCULAR | Status: DC | PRN

## 2019-03-28 MED ORDER — ONDANSETRON HCL 4 MG/2ML IJ SOLN
INTRAMUSCULAR | Status: DC | PRN
  Administered 2019-03-28: 4 mg via INTRAVENOUS

## 2019-03-28 SURGICAL SUPPLY — 19 items
COVER WAND RF STERILE (DRAPES) IMPLANT
DRSG TELFA 3X8 NADH (GAUZE/BANDAGES/DRESSINGS) ×2 IMPLANT
GAUZE 4X4 16PLY RFD (DISPOSABLE) ×2 IMPLANT
GLOVE BIOGEL PI ORTHO PRO 7.5 (GLOVE) ×1
GLOVE PI ORTHO PRO STRL 7.5 (GLOVE) ×1 IMPLANT
GOWN STRL REUS W/ TWL LRG LVL3 (GOWN DISPOSABLE) ×1 IMPLANT
GOWN STRL REUS W/TWL LRG LVL3 (GOWN DISPOSABLE) ×1
KIT PROCEDURE FLUENT (KITS) ×4 IMPLANT
KIT TURNOVER CYSTO (KITS) ×2 IMPLANT
NS IRRIG 500ML POUR BTL (IV SOLUTION) ×2 IMPLANT
PACK DNC HYST (MISCELLANEOUS) ×2 IMPLANT
PAD OB MATERNITY 4.3X12.25 (PERSONAL CARE ITEMS) ×2 IMPLANT
PAD PREP 24X41 OB/GYN DISP (PERSONAL CARE ITEMS) ×2 IMPLANT
SEAL ROD LENS SCOPE MYOSURE (ABLATOR) ×2 IMPLANT
SOL .9 NS 3000ML IRR  AL (IV SOLUTION) ×1
SOL .9 NS 3000ML IRR UROMATIC (IV SOLUTION) ×1 IMPLANT
SYS MIRENA INTRAUTERINE (MISCELLANEOUS) ×2
SYSTEM MIRENA INTRAUTERINE (MISCELLANEOUS) ×1 IMPLANT
TOWEL OR 17X26 4PK STRL BLUE (TOWEL DISPOSABLE) ×2 IMPLANT

## 2019-03-28 NOTE — Op Note (Signed)
    OPERATIVE NOTE 03/28/2019 10:29 AM  PRE-OPERATIVE DIAGNOSIS:  1) DUB, ENDOMETRIAL POLYP, FIBROIDS  POST-OPERATIVE DIAGNOSIS:  Same  OPERATION:  D&C  SURGEON(S): Surgeon(s) and Role:    Harlin Heys, MD - Primary   ANESTHESIA: General  ESTIMATED BLOOD LOSS: 40 mL  OPERATIVE FINDINGS:   SPECIMEN:  ID Type Source Tests Collected by Time Destination  1 : endometrial currettings Tissue [facility] Gyn biopsy SURGICAL PATHOLOGY Harlin Heys, MD [id number]     COMPLICATIONS: None  DRAINS: Foley to gravity  DISPOSITION: Stable to recovery room  DESCRIPTION OF PROCEDURE:      The patient was prepped and draped in the dorsal lithotomy position and placed under general anesthesia. Her bladder was emptied of 175cc.  Her cervix was grasped with a Jacob's tenaculum. The uterus was sounded to 10cm.  Respecting the position and curvature of her cervix, it was dilated. The hysteroscope was placed within the endometrial cavity and the cavity was explored.  An anterior exuberation/polyp was noted.  A systematic curettage was performed in all quadrants until a gritty texture was obtianed.  The hysteroscope was replaced and the polyp was noted to be gone.  The Mirena IUD was placed in the usual manner and the strings cut.  The tenaculum was removed from the cervix and hemostasis was noted. The speculum was removed and the patient went to recovery room in stable condition.  No follow-up provider specified.  Finis Bud, M.D. 03/28/2019 10:29 AM

## 2019-03-28 NOTE — Transfer of Care (Signed)
Immediate Anesthesia Transfer of Care Note  Patient: Victoria Bowers  Procedure(s) Performed: DILATATION AND CURETTAGE /HYSTEROSCOPY (N/A ) INTRAUTERINE DEVICE (IUD) INSERTION (N/A )  Patient Location: PACU  Anesthesia Type:General  Level of Consciousness: sedated  Airway & Oxygen Therapy: Patient Spontanous Breathing and Patient connected to face mask oxygen  Post-op Assessment: Report given to RN and Post -op Vital signs reviewed and stable  Post vital signs: Reviewed and stable  Last Vitals:  Vitals Value Taken Time  BP 141/74 03/28/19 1041  Temp 36.2 C 03/28/19 1041  Pulse 79 03/28/19 1044  Resp 17 03/28/19 1044  SpO2 100 % 03/28/19 1044  Vitals shown include unvalidated device data.  Last Pain:  Vitals:   03/28/19 1041  TempSrc:   PainSc: Asleep         Complications: No apparent anesthesia complications

## 2019-03-28 NOTE — Anesthesia Procedure Notes (Signed)
Procedure Name: LMA Insertion Performed by: Keishaun Hazel, CRNA Pre-anesthesia Checklist: Patient identified, Patient being monitored, Timeout performed, Emergency Drugs available and Suction available Patient Re-evaluated:Patient Re-evaluated prior to induction Oxygen Delivery Method: Circle system utilized Preoxygenation: Pre-oxygenation with 100% oxygen Induction Type: IV induction Ventilation: Mask ventilation without difficulty LMA: LMA inserted LMA Size: 3.5 Tube type: Oral Number of attempts: 1 Placement Confirmation: positive ETCO2 and breath sounds checked- equal and bilateral Tube secured with: Tape Dental Injury: Teeth and Oropharynx as per pre-operative assessment        

## 2019-03-28 NOTE — Interval H&P Note (Signed)
History and Physical Interval Note:  03/28/2019 8:47 AM  Victoria Bowers  has presented today for surgery, with the diagnosis of DUB, ENDOMETRIAL POLYP, FIBROIDS.  The various methods of treatment have been discussed with the patient and family. After consideration of risks, benefits and other options for treatment, the patient has consented to  Procedure(s): DILATATION AND CURETTAGE /HYSTEROSCOPY (N/A) INTRAUTERINE DEVICE (IUD) INSERTION (N/A) as a surgical intervention.  The patient's history has been reviewed, patient examined, no change in status, stable for surgery.  I have reviewed the patient's chart and labs.  Questions were answered to the patient's satisfaction.     Jeannie Fend

## 2019-03-28 NOTE — Anesthesia Preprocedure Evaluation (Signed)
Anesthesia Evaluation  Patient identified by MRN, date of birth, ID band Patient awake    Reviewed: Allergy & Precautions, H&P , NPO status , Patient's Chart, lab work & pertinent test results, reviewed documented beta blocker date and time   History of Anesthesia Complications (+) PONV and history of anesthetic complications  Airway Mallampati: II  TM Distance: >3 FB Neck ROM: full    Dental  (+) Teeth Intact   Pulmonary neg pulmonary ROS,    Pulmonary exam normal        Cardiovascular Exercise Tolerance: Good hypertension, On Medications negative cardio ROS Normal cardiovascular exam Rate:Normal     Neuro/Psych  Headaches,  Neuromuscular disease negative neurological ROS  negative psych ROS   GI/Hepatic negative GI ROS, Neg liver ROS, GERD  Medicated,  Endo/Other  Morbid obesity  Renal/GU negative Renal ROS  negative genitourinary   Musculoskeletal   Abdominal   Peds  Hematology negative hematology ROS (+)   Anesthesia Other Findings   Reproductive/Obstetrics negative OB ROS                             Anesthesia Physical Anesthesia Plan  ASA: III  Anesthesia Plan: General LMA   Post-op Pain Management:    Induction:   PONV Risk Score and Plan:   Airway Management Planned:   Additional Equipment:   Intra-op Plan:   Post-operative Plan:   Informed Consent: I have reviewed the patients History and Physical, chart, labs and discussed the procedure including the risks, benefits and alternatives for the proposed anesthesia with the patient or authorized representative who has indicated his/her understanding and acceptance.       Plan Discussed with: CRNA  Anesthesia Plan Comments:         Anesthesia Quick Evaluation

## 2019-03-28 NOTE — Anesthesia Post-op Follow-up Note (Signed)
Anesthesia QCDR form completed.        

## 2019-03-28 NOTE — Discharge Instructions (Addendum)
Dilation and Curettage or Vacuum Curettage, Care After This sheet gives you information about how to care for yourself after your procedure. Your health care provider may also give you more specific instructions. If you have problems or questions, contact your health care provider. What can I expect after the procedure? After your procedure, it is common to have:  Mild pain or cramping.  Some vaginal bleeding or spotting. These may last for up to 2 weeks after your procedure. Follow these instructions at home: Activity   Do not drive or use heavy machinery while taking prescription pain medicine.  Avoid driving for the first 24 hours after your procedure.  Take frequent, short walks, followed by rest periods, throughout the day. Ask your health care provider what activities are safe for you. After 1-2 days, you may be able to return to your normal activities.  Do not lift anything heavier than 10 lb (4.5 kg) until your health care provider approves.  For at least 2 weeks, or as long as told by your health care provider, do not: ? Douche. ? Use tampons. ? Have sexual intercourse. General instructions   Take over-the-counter and prescription medicines only as told by your health care provider. This is especially important if you take blood thinning medicine.  Do not take baths, swim, or use a hot tub until your health care provider approves. Take showers instead of baths.  Wear compression stockings as told by your health care provider. These stockings help to prevent blood clots and reduce swelling in your legs.  It is your responsibility to get the results of your procedure. Ask your health care provider, or the department performing the procedure, when your results will be ready.  Keep all follow-up visits as told by your health care provider. This is important. Contact a health care provider if:  You have severe cramps that get worse or that do not get better with  medicine.  You have severe abdominal pain.  You cannot drink fluids without vomiting.  You develop pain in a different area of your pelvis.  You have bad-smelling vaginal discharge.  You have a rash. Get help right away if:  You have vaginal bleeding that soaks more than one sanitary pad in 1 hour, for 2 hours in a row.  You pass large blood clots from your vagina.  You have a fever that is above 100.49F (38.0C).  Your abdomen feels very tender or hard.  You have chest pain.  You have shortness of breath.  You cough up blood.  You feel dizzy or light-headed.  You faint.  You have pain in your neck or shoulder area. This information is not intended to replace advice given to you by your health care provider. Make sure you discuss any questions you have with your health care provider. Document Released: 09/12/2000 Document Revised: 08/28/2017 Document Reviewed: 04/17/2016 Elsevier Patient Education  2020 [place]   1) The drugs that you were given will stay in your system until tomorrow so for the next 24 hours you should not:  A) Drive an automobile B) Make any legal decisions C) Drink any alcoholic beverage   2) You may resume regular meals tomorrow.  Today it is better to start with liquids and gradually work up to solid foods.  You may eat anything you prefer, but it is better to start with liquids, then soup and crackers, and gradually work up to solid foods.  3) Please notify your doctor immediately if you have any unusual bleeding, trouble breathing, redness and pain at the surgery site, drainage, fever, or pain not relieved by medication. ° ° ° °4) Additional Instructions: ° ° ° ° ° ° ° °Please contact your physician with any problems or Same Day Surgery at [phone], Monday through Friday 6 am to 4 pm, or [facility] number at [phone]. ° °

## 2019-03-30 LAB — SURGICAL PATHOLOGY

## 2019-03-30 NOTE — Anesthesia Postprocedure Evaluation (Signed)
Anesthesia Post Note  Patient: Victoria Bowers  Procedure(s) Performed: DILATATION AND CURETTAGE /HYSTEROSCOPY (N/A ) INTRAUTERINE DEVICE (IUD) INSERTION (N/A )  Patient location during evaluation: PACU Anesthesia Type: General Level of consciousness: awake and alert Pain management: pain level controlled Vital Signs Assessment: post-procedure vital signs reviewed and stable Respiratory status: spontaneous breathing, nonlabored ventilation, respiratory function stable and patient connected to nasal cannula oxygen Cardiovascular status: blood pressure returned to baseline and stable Postop Assessment: no apparent nausea or vomiting Anesthetic complications: no     Last Vitals:  Vitals:   03/28/19 1130 03/28/19 1238  BP: 138/73 135/80  Pulse: 72 76  Resp: 18 18  Temp: 36.7 C   SpO2: 97% 99%    Last Pain:  Vitals:   03/29/19 1024  TempSrc:   PainSc: 0-No pain                 Molli Barrows

## 2019-04-05 ENCOUNTER — Encounter: Payer: Self-pay | Admitting: Obstetrics and Gynecology

## 2019-04-05 ENCOUNTER — Other Ambulatory Visit: Payer: Self-pay

## 2019-04-05 ENCOUNTER — Ambulatory Visit (INDEPENDENT_AMBULATORY_CARE_PROVIDER_SITE_OTHER): Payer: 59 | Admitting: Obstetrics and Gynecology

## 2019-04-05 VITALS — BP 126/84 | HR 82 | Ht 62.0 in | Wt 237.6 lb

## 2019-04-05 DIAGNOSIS — Z30430 Encounter for insertion of intrauterine contraceptive device: Secondary | ICD-10-CM | POA: Diagnosis not present

## 2019-04-05 DIAGNOSIS — Z9889 Other specified postprocedural states: Secondary | ICD-10-CM

## 2019-04-05 NOTE — Progress Notes (Signed)
Patient comes in today for Post op appointment. She has no concerns today.

## 2019-04-05 NOTE — Progress Notes (Signed)
HPI:      Ms. Lannie Heaps Platz is a 48 y.o. No obstetric history on file. who LMP was No LMP recorded.  Subjective:   She presents today approximately 1 week from [facility] with IUD placement for endometrial polyp.  She states that she feels significantly better.  She is happy to be off the progesterone.  She reports no bleeding or cramping which is a market change for her.    Hx: The following portions of the patient's history were reviewed and updated as appropriate:             She  has a past medical history of Allergy, Bertolotti's syndrome, Complication of anesthesia, Fatty liver, Fibroid, GERD (gastroesophageal reflux disease), Hypertension, Ovarian cyst, and PONV (postoperative nausea and vomiting). She does not have any pertinent problems on file. She  has a past surgical history that includes Cholecystectomy; Spine surgery (ruptured disc); Nasal sinus surgery (X2); Tonsillectomy and adenoidectomy (1985); Hysteroscopy w/D&C (N/A, 03/28/2019); and Intrauterine device (iud) insertion (N/A, 03/28/2019). Her family history includes Heart disease in her mother; Hypertension in her brother, brother, father, and paternal grandmother. She  reports that she has never smoked. She has never used smokeless tobacco. She reports that she does not drink alcohol or use drugs. She has a current medication list which includes the following prescription(s): alum & mag hydroxide-simeth, amlodipine, atenolol, fluticasone, olmesartan, and norethindrone. She is allergic to sulfa antibiotics and sulfur.       Review of Systems:  Review of Systems  Constitutional: Denied constitutional symptoms, night sweats, recent illness, fatigue, fever, insomnia and weight loss.  Eyes: Denied eye symptoms, eye pain, photophobia, vision change and visual disturbance.  Ears/Nose/Throat/Neck: Denied ear, nose, throat or neck symptoms, hearing loss, nasal discharge, sinus congestion and sore throat.  Cardiovascular: Denied  cardiovascular symptoms, arrhythmia, chest pain/pressure, edema, exercise intolerance, orthopnea and palpitations.  Respiratory: Denied pulmonary symptoms, asthma, pleuritic pain, productive sputum, cough, dyspnea and wheezing.  Gastrointestinal: Denied, gastro-esophageal reflux, melena, nausea and vomiting.  Genitourinary: Denied genitourinary symptoms including symptomatic vaginal discharge, pelvic relaxation issues, and urinary complaints.  Musculoskeletal: Denied musculoskeletal symptoms, stiffness, swelling, muscle weakness and myalgia.  Dermatologic: Denied dermatology symptoms, rash and scar.  Neurologic: Denied neurology symptoms, dizziness, headache, neck pain and syncope.  Psychiatric: Denied psychiatric symptoms, anxiety and depression.  Endocrine: Denied endocrine symptoms including hot flashes and night sweats.   Meds:   Current Outpatient Medications on File Prior to Visit  Medication Sig Dispense Refill  . alum & mag hydroxide-simeth (MYLANTA) 650-354-65 MG/5ML suspension Take 15 mLs by mouth every 6 (six) hours as needed for indigestion or heartburn.    Marland Kitchen amLODipine (NORVASC) 5 MG tablet Take 1 tablet (5 mg total) by mouth daily. (Patient taking differently: Take 5 mg by mouth every morning. ) 90 tablet 1  . atenolol (TENORMIN) 25 MG tablet Take 1 tablet (25 mg total) by mouth daily. (Patient taking differently: Take 25 mg by mouth every morning. ) 90 tablet 1  . fluticasone (FLONASE) 50 MCG/ACT nasal spray Place 2 sprays into both nostrils every morning.    . olmesartan (BENICAR) 40 MG tablet Take 1 tablet (40 mg total) by mouth daily. (Patient taking differently: Take 40 mg by mouth every morning. ) 90 tablet 1  . norethindrone (AYGESTIN) 5 MG tablet Take 1 tablet (5 mg total) by mouth 2 (two) times daily for 25 days. As directed 50 tablet 0   No current facility-administered medications on file prior to visit.  Objective:     Vitals:   04/05/19 0854  BP: 126/84   Pulse: 82                Assessment:    No obstetric history on file. Patient Active Problem List   Diagnosis Date Noted  . Disorder of skin of trunk 03/18/2018  . Displacement of lumbar intervertebral disc without myelopathy 03/18/2018  . Inflammation of sacroiliac joint ([place]) 03/18/2018  . Low back pain 03/18/2018  . Vitamin D deficiency 05/15/2017  . DUB (dysfunctional uterine bleeding) 03/21/2016  . Left ovarian cyst 03/19/2016  . Abnormal bleeding in menstrual cycle 03/10/2016  . Left elbow tendonitis 11/28/2015  . Fatty liver 07/16/2015  . Migraine 07/16/2015  . HTN (hypertension) 03/09/2015  . Obesity 03/09/2015     1. Post-operative state     Patient doing very well postop- pathology results reviewed with the patient showing small endometrial polyp.   Plan:            1.  Expect bleeding intermittently over the next few weeks but then hoping for amenorrhea with Mirena. Orders No orders of the defined types were placed in this encounter.   No orders of the defined types were placed in this encounter.     F/U  Return in about 4 weeks (around 05/03/2019) for For IUD f/u.  Finis Bud, M.D. 04/05/2019 9:16 AM

## 2019-05-01 ENCOUNTER — Other Ambulatory Visit: Payer: Self-pay | Admitting: Obstetrics and Gynecology

## 2019-05-01 DIAGNOSIS — N938 Other specified abnormal uterine and vaginal bleeding: Secondary | ICD-10-CM

## 2019-05-04 ENCOUNTER — Encounter: Payer: Self-pay | Admitting: Obstetrics and Gynecology

## 2019-05-04 ENCOUNTER — Ambulatory Visit (INDEPENDENT_AMBULATORY_CARE_PROVIDER_SITE_OTHER): Payer: 59 | Admitting: Obstetrics and Gynecology

## 2019-05-04 ENCOUNTER — Other Ambulatory Visit: Payer: Self-pay

## 2019-05-04 VITALS — BP 138/84 | HR 89 | Ht 62.0 in | Wt 244.0 lb

## 2019-05-04 DIAGNOSIS — Z30431 Encounter for routine checking of intrauterine contraceptive device: Secondary | ICD-10-CM | POA: Diagnosis not present

## 2019-05-04 DIAGNOSIS — N938 Other specified abnormal uterine and vaginal bleeding: Secondary | ICD-10-CM | POA: Diagnosis not present

## 2019-05-04 NOTE — Progress Notes (Signed)
HPI:      [name] is a 48 y.o. No obstetric history on file. who LMP was No LMP recorded (lmp unknown).  Subjective:   She prese presents today for follow-up of IUD and dysfunctional bleeding.  She reports that she has had no bleeding since her last visit and no issues at all.  She says she is sleeping better her hot flashes have seemed to resolve.  Also states that she has resumed intercourse without issue.  She is generally very happy.     Hx: The following portions of the patient's history were reviewed and updated as appropriate:             She  has a past medical history of Allergy, Bertolotti's syndrome, Complication of anesthesia, Fatty liver, Fibroid, GERD (gastroesophageal reflux disease), Hypertension, Ovarian cyst, and PONV (postoperative nausea and vomiting). She does not have any pertinent problems on file. She  has a past surgical history that includes Cholecystectomy; Spine surgery (ruptured disc); Nasal sinus surgery (X2); Tonsillectomy and adenoidectomy (1985); Hysteroscopy w/D&C (N/A, 03/28/2019); and Intrauterine device (iud) insertion (N/A, 03/28/2019). Her family history includes Heart disease in her mother; Hypertension in her brother, brother, father, and paternal grandmother. She  reports that she has never smoked. She has never used smokeless tobacco. She reports that she does not drink alcohol or use drugs. She has a current medication list which includes the following prescription(s): amlodipine, atenolol, fluticasone, olmesartan, alum & mag hydroxide-simeth, and norethindrone. She is allergic to sulfa antibiotics and sulfur.       Review of Systems:  Review of Systems  Constitutional: Denied constitutional symptoms, night sweats, recent illness, fatigue, fever, insomnia and weight loss.  Eyes: Denied eye symptoms, eye pain, photophobia, vision change and visual disturbance.  Ears/Nose/Throat/Neck: Denied ear, nose, throat or neck symptoms, hearing  loss, nasal discharge, sinus congestion and sore throat.  Cardiovascular: Denied cardiovascular symptoms, arrhythmia, chest pain/pressure, edema, exercise intolerance, orthopnea and palpitations.  Respiratory: Denied pulmonary symptoms, asthma, pleuritic pain, productive sputum, cough, dyspnea and wheezing.  Gastrointestinal: Denied, gastro-esophageal reflux, melena, nausea and vomiting.  Genitourinary: Denied genitourinary symptoms including symptomatic vaginal discharge, pelvic relaxation issues, and urinary complaints.  Musculoskeletal: Denied musculoskeletal symptoms, stiffness, swelling, muscle weakness and myalgia.  Dermatologic: Denied dermatology symptoms, rash and scar.  Neurologic: Denied neurology symptoms, dizziness, headache, neck pain and syncope.  Psychiatric: Denied psychiatric symptoms, anxiety and depression.  Endocrine: Denied endocrine symptoms including hot flashes and night sweats.   Meds:   Current Outpatient Medications on File Prior to Visit  Medication Sig Dispense Refill  . amLODipine (NORVASC) 5 MG tablet Take 1 tablet (5 mg total) by mouth daily. (Patient taking differently: Take 5 mg by mouth every morning. ) 90 tablet 1  . atenolol (TENORMIN) 25 MG tablet Take 1 tablet (25 mg total) by mouth daily. (Patient taking differently: Take 25 mg by mouth every morning. ) 90 tablet 1  . fluticasone (FLONASE) 50 MCG/ACT nasal spray Place 2 sprays into both nostrils every morning.    . olmesartan (BENICAR) 40 MG tablet Take 1 tablet (40 mg total) by mouth daily. (Patient taking differently: Take 40 mg by mouth every morning. ) 90 tablet 1  . alum & mag hydroxide-simeth (MYLANTA) 200-200-20 MG/5ML suspension Take 15 mLs by mouth every 6 (six) hours as needed for indigestion or heartburn.    . norethindrone (AYGESTIN) 5 MG tablet Take 1 tablet (5 mg total) by mouth 2 (two) times daily for 25 days.  As directed 50 tablet 0   No current facility-administered medications on file  prior to visit.     Objective:     Vitals:   05/04/19 0847  BP: 138/84  Pulse: 89              Physical examination   Pelvic:   Vulva: Normal appearance.  No lesions.  Vagina: No lesions or abnormalities noted.  Support: Normal pelvic support.  Urethra No masses tenderness or scarring.  Meatus Normal size without lesions or prolapse.  Cervix: Normal appearance.  No lesions. IUD strings noted at cervical os.  Anus: Normal exam.  No lesions.  Perineum: Normal exam.  No lesions.        Bimanual   Uterus: Normal size.  Non-tender.  Mobile.  AV.  Adnexae: No masses.  Non-tender to palpation.  Cul-de-sac: Negative for abnormality.     Assessment:    No obstetric history on file. Patient Active Problem List   Diagnosis Date Noted  . Disorder of skin of trunk 03/18/2018  . Displacement of lumbar intervertebral disc without myelopathy 03/18/2018  . Inflammation of sacroiliac joint ([place]) 03/18/2018  . Low back pain 03/18/2018  . Vitamin D deficiency 05/15/2017  . DUB (dysfunctional uterine bleeding) 03/21/2016  . Left ovarian cyst 03/19/2016  . Abnormal bleeding in menstrual cycle 03/10/2016  . Left elbow tendonitis 11/28/2015  . Fatty liver 07/16/2015  . Migraine 07/16/2015  . HTN (hypertension) 03/09/2015  . Obesity 03/09/2015     1. Dysfunctional uterine bleeding   2. Surveillance of previously prescribed intrauterine contraceptive device     Patient not having any issues at this time.   Plan:            1.  Follow-up for annual exam. Orders No orders of the defined types were placed in this encounter.   No orders of the defined types were placed in this encounter.     F/U  Return in about 6 months (around 11/04/2019) for Annual Physical. I spent 16 minutes involved in the care of this patient of which greater than 50% was spent discussing surgery, IUD and expectations regarding menses, hot flashes, return to sexual activity, follow-up for annual exam.  All  questions answered  Finis Bud, M.D. 05/04/2019 8:59 AM

## 2019-05-04 NOTE — Progress Notes (Signed)
Patient comes in today for 4 week post op appointment. No concerns.

## 2019-05-23 ENCOUNTER — Encounter: Payer: Self-pay | Admitting: Family Medicine

## 2019-05-23 ENCOUNTER — Other Ambulatory Visit: Payer: Self-pay

## 2019-05-23 ENCOUNTER — Ambulatory Visit (INDEPENDENT_AMBULATORY_CARE_PROVIDER_SITE_OTHER): Payer: 59 | Admitting: Family Medicine

## 2019-05-23 DIAGNOSIS — I10 Essential (primary) hypertension: Secondary | ICD-10-CM

## 2019-05-23 MED ORDER — ATENOLOL 25 MG PO TABS: 25.0000 mg | ORAL_TABLET | Freq: Every day | ORAL | 1 refills | Status: DC

## 2019-05-23 MED ORDER — AMLODIPINE BESYLATE 5 MG PO TABS: 5.0000 mg | ORAL_TABLET | Freq: Every day | ORAL | 1 refills | Status: DC

## 2019-05-23 MED ORDER — OLMESARTAN MEDOXOMIL 40 MG PO TABS: 40.0000 mg | ORAL_TABLET | Freq: Every day | ORAL | 1 refills | Status: DC

## 2019-05-23 NOTE — Progress Notes (Signed)
LMP  (LMP Unknown)    Subjective:    Patient ID: Victoria Bowers, female    DOB: Jan 02, 1971, 48 y.o.   MRN: [id number]  HPI: Victoria Bowers is a 48 y.o. female  Chief Complaint  Patient presents with  . Hypertension    . This visit was completed via WebEx due to the restrictions of the COVID-19 pandemic. All issues as above were discussed and addressed. Physical exam was done as above through visual confirmation on WebEx. If it was felt that the patient should be evaluated in the office, they were directed there. The patient verbally consented to this visit. . Location of the patient: home . Location of the provider: home . Those involved with this call:  . Provider: Merrie Roof, PA-C . CMA: Tiffany Reel, CMA . Front Desk/Registration: Jill Side  . Time spent on call: 15 minutes with patient face to face via video conference. More than 50% of this time was spent in counseling and coordination of care. 5 minutes total spent in review of patient's record and preparation of their chart. I verified patient identity using two factors (patient name and date of birth). Patient consents verbally to being seen via telemedicine visit today.   Patient presenting today for 6 month f/u HTN. Taking her medicine faithfully without side effects. Denies CP, SOB, HAs, dizziness. Tries to eat well and stay active. Does not check home BPs but has been having excellent readings at other appts recently - typically 120s/80s per record review from GYN appts.   Relevant past medical, surgical, family and social history reviewed and updated as indicated. Interim medical history since our last visit reviewed. Allergies and medications reviewed and updated.  Review of Systems  Per HPI unless specifically indicated above     Objective:    LMP  (LMP Unknown)   Wt Readings from Last 3 Encounters:  05/04/19 244 lb (110.7 kg)  04/05/19 237 lb 9.6 oz (107.8 kg)  03/28/19 240 lb (108.9 kg)     Physical Exam Vitals signs and nursing note reviewed.  Constitutional:      General: She is not in acute distress.    Appearance: Normal appearance.  HENT:     Head: Atraumatic.     Right Ear: External ear normal.     Left Ear: External ear normal.     Nose: Nose normal. No congestion.     Mouth/Throat:     Mouth: Mucous membranes are moist.     Pharynx: Oropharynx is clear. No posterior oropharyngeal erythema.  Eyes:     Extraocular Movements: Extraocular movements intact.     Conjunctiva/sclera: Conjunctivae normal.  Neck:     Musculoskeletal: Normal range of motion.  Cardiovascular:     Comments: Unable to assess via virtual visit Pulmonary:     Effort: Pulmonary effort is normal. No respiratory distress.  Musculoskeletal: Normal range of motion.  Skin:    General: Skin is dry.     Findings: No erythema.  Neurological:     Mental Status: She is alert and oriented to person, place, and time.  Psychiatric:        Mood and Affect: Mood normal.        Thought Content: Thought content normal.        Judgment: Judgment normal.     Results for orders placed or performed during the hospital encounter of 03/28/19  Pregnancy, urine POC  Result Value Ref Range   Preg Test, Ur NEGATIVE NEGATIVE  ABO/Rh  Result Value Ref Range   ABO/RH(D)      A POS Performed at [facility], [address], [place], [place] [address]   Surgical pathology  Result Value Ref Range   SURGICAL PATHOLOGY      Surgical Pathology CASE: [id number] PATIENT: [facility] [place] Surgical Pathology Report     SPECIMEN SUBMITTED: A. Endometrial curettings  CLINICAL HISTORY: None provided  PRE-OPERATIVE DIAGNOSIS: DUB, endometrial polyp, fibroids  POST-OPERATIVE DIAGNOSIS: None provided.     DIAGNOSIS: A.  ENDOMETRIAL CURETTINGS: - PREDOMINANTLY BLOOD CLOT WITH FRAGMENTS OF ENDOMETRIAL POLYP. - RARE FRAGMENTS OF BENIGN MYOMETRIUM, SQUAMOUS MUCOSA, ENDOCERVICAL GLANDULAR  EPITHELIUM, AND ENDOMETRIUM WITH DECIDUALIZED STROMA. - NEGATIVE FOR ATYPIA / EIN AND MALIGNANCY.  GROSS DESCRIPTION: A. Labeled: Endometrial curettings Received: Formalin Tissue fragment(s): Multiple Size: Aggregate, 23.5 x 2.0 x 0.5 cm Description: Received is red-brown, hemorrhagic material admixed with possible soft tissue fragments.  Filtered into mesh bags. Entirely submitted in cassettes 1-12.   Final Diagnosis performed by Quay Burow, MD.   Electronically signed 03/30/2019 8:14:43AM The  electronic signature indicates that the named Attending Pathologist has evaluated the specimen  Technical component performed at [facility], [address] [place],  [address] Lab: [phone] Dir: Rush Farmer, MD, MMM  Professional component performed at [facility], [facility], [place], [place],  [address] Lab: [phone] Dir: Dellia Nims. Rubinas, MD       Assessment & Plan:   Problem List Items Addressed This Visit      Cardiovascular and Mediastinum   HTN (hypertension) - Primary    No access to BP cuff to check her reading today, but will obtain vitals when she presents in the near future for labs. Reviewed last few months of readings from GYN visits which were WNL. Continue current regimen, recommended picking up a BP monitor.      Relevant Medications   atenolol (TENORMIN) 25 MG tablet   olmesartan (BENICAR) 40 MG tablet   amLODipine (NORVASC) 5 MG tablet   Other Relevant Orders   Basic metabolic panel       Follow up plan: Return in about 6 months (around 11/23/2019) for CPE.

## 2019-05-26 NOTE — Assessment & Plan Note (Signed)
No access to BP cuff to check her reading today, but will obtain vitals when she presents in the near future for labs. Reviewed last few months of readings from GYN visits which were WNL. Continue current regimen, recommended picking up a BP monitor.

## 2019-10-03 ENCOUNTER — Ambulatory Visit: Payer: 59 | Attending: Internal Medicine

## 2019-10-03 DIAGNOSIS — Z20822 Contact with and (suspected) exposure to covid-19: Secondary | ICD-10-CM

## 2019-10-04 LAB — NOVEL CORONAVIRUS, NAA: SARS-CoV-2, NAA: DETECTED — AB

## 2019-10-21 ENCOUNTER — Ambulatory Visit: Payer: 59 | Attending: Internal Medicine

## 2019-10-21 DIAGNOSIS — Z20822 Contact with and (suspected) exposure to covid-19: Secondary | ICD-10-CM

## 2019-10-22 LAB — NOVEL CORONAVIRUS, NAA: SARS-CoV-2, NAA: NOT DETECTED

## 2019-10-24 ENCOUNTER — Ambulatory Visit: Payer: 59 | Attending: Internal Medicine

## 2019-10-24 DIAGNOSIS — Z20822 Contact with and (suspected) exposure to covid-19: Secondary | ICD-10-CM

## 2019-10-25 LAB — NOVEL CORONAVIRUS, NAA: SARS-CoV-2, NAA: NOT DETECTED

## 2019-11-04 ENCOUNTER — Ambulatory Visit (INDEPENDENT_AMBULATORY_CARE_PROVIDER_SITE_OTHER): Payer: 59 | Admitting: Obstetrics and Gynecology

## 2019-11-04 ENCOUNTER — Encounter: Payer: Self-pay | Admitting: Obstetrics and Gynecology

## 2019-11-04 ENCOUNTER — Other Ambulatory Visit (HOSPITAL_COMMUNITY)
Admission: RE | Admit: 2019-11-04 | Discharge: 2019-11-04 | Disposition: A | Discharge status: Home / Self Care | Payer: 59 | Source: Ambulatory Visit | Attending: Obstetrics and Gynecology | Admitting: Obstetrics and Gynecology

## 2019-11-04 ENCOUNTER — Other Ambulatory Visit: Payer: Self-pay

## 2019-11-04 VITALS — BP 119/70 | HR 61 | Ht 62.0 in | Wt 247.9 lb

## 2019-11-04 DIAGNOSIS — Z6841 Body Mass Index (BMI) 40.0 and over, adult: Secondary | ICD-10-CM | POA: Diagnosis not present

## 2019-11-04 DIAGNOSIS — Z01419 Encounter for gynecological examination (general) (routine) without abnormal findings: Secondary | ICD-10-CM | POA: Diagnosis not present

## 2019-11-04 DIAGNOSIS — Z124 Encounter for screening for malignant neoplasm of cervix: Secondary | ICD-10-CM

## 2019-11-04 NOTE — Addendum Note (Signed)
Addended by: Durwin Glaze on: 11/04/2019 09:03 AM   Modules accepted: Orders

## 2019-11-04 NOTE — Progress Notes (Signed)
Patient comes in today for annual exam. She has no concerns today. Mammogram and Pap due.

## 2019-11-04 NOTE — Progress Notes (Signed)
HPI:      [name] is a 49 y.o. No obstetric history on file. who LMP was Patient's last menstrual period was 10/28/2019.  Subjective:   She presents today for her annual examination.  She has no complaints.  She is now very happy with her IUD.  Has occasional monthly spotting but no real menstrual periods. She denies hot flashes. She is followed very closely by her PCP for hypertension.    Hx: The following portions of the patient's history were reviewed and updated as appropriate:             She  has a past medical history of Allergy, Bertolotti's syndrome, Complication of anesthesia, Fatty liver, Fibroid, GERD (gastroesophageal reflux disease), Hypertension, Ovarian cyst, and PONV (postoperative nausea and vomiting). She does not have any pertinent problems on file. She  has a past surgical history that includes Cholecystectomy; Spine surgery (ruptured disc); Nasal sinus surgery (X2); Tonsillectomy and adenoidectomy (1985); Hysteroscopy with D & C (N/A, 03/28/2019); and Intrauterine device (iud) insertion (N/A, 03/28/2019). Her family history includes Heart disease in her mother; Hypertension in her brother, brother, father, and paternal grandmother. She  reports that she has never smoked. She has never used smokeless tobacco. She reports that she does not drink alcohol or use drugs. She has a current medication list which includes the following prescription(s): alum & mag hydroxide-simeth, amlodipine, atenolol, fluticasone, levonorgestrel, and olmesartan. She is allergic to sulfa antibiotics and sulfur.       Review of Systems:  Review of Systems  Constitutional: Denied constitutional symptoms, night sweats, recent illness, fatigue, fever, insomnia and weight loss.  Eyes: Denied eye symptoms, eye pain, photophobia, vision change and visual disturbance.  Ears/Nose/Throat/Neck: Denied ear, nose, throat or neck symptoms, hearing loss, nasal discharge, sinus congestion and sore  throat.  Cardiovascular: Denied cardiovascular symptoms, arrhythmia, chest pain/pressure, edema, exercise intolerance, orthopnea and palpitations.  Respiratory: Denied pulmonary symptoms, asthma, pleuritic pain, productive sputum, cough, dyspnea and wheezing.  Gastrointestinal: Denied, gastro-esophageal reflux, melena, nausea and vomiting.  Genitourinary: Denied genitourinary symptoms including symptomatic vaginal discharge, pelvic relaxation issues, and urinary complaints.  Musculoskeletal: Denied musculoskeletal symptoms, stiffness, swelling, muscle weakness and myalgia.  Dermatologic: Denied dermatology symptoms, rash and scar.  Neurologic: Denied neurology symptoms, dizziness, headache, neck pain and syncope.  Psychiatric: Denied psychiatric symptoms, anxiety and depression.  Endocrine: Denied endocrine symptoms including hot flashes and night sweats.   Meds:   Current Outpatient Medications on File Prior to Visit  Medication Sig Dispense Refill  . alum & mag hydroxide-simeth (MYLANTA) [id number] MG/5ML suspension Take 15 mLs by mouth every 6 (six) hours as needed for indigestion or heartburn.    Marland Kitchen amLODipine (NORVASC) 5 MG tablet Take 1 tablet (5 mg total) by mouth daily. 90 tablet 1  . atenolol (TENORMIN) 25 MG tablet Take 1 tablet (25 mg total) by mouth daily. 90 tablet 1  . fluticasone (FLONASE) 50 MCG/ACT nasal spray Place 2 sprays into both nostrils every morning.    Marland Kitchen levonorgestrel (MIRENA) 20 MCG/24HR IUD 1 each by Intrauterine route once.    Marland Kitchen olmesartan (BENICAR) 40 MG tablet Take 1 tablet (40 mg total) by mouth daily. 90 tablet 1   No current facility-administered medications on file prior to visit.    Objective:     Vitals:   11/04/19 0810  BP: 119/70  Pulse: 61              Physical examination General NAD, Conversant  HEENT Atraumatic;  Op clear with mmm.  Normo-cephalic. Pupils reactive. Anicteric sclerae  Thyroid/Neck Smooth without nodularity or enlargement.  Normal ROM.  Neck Supple.  Skin No rashes, lesions or ulceration. Normal palpated skin turgor. No nodularity.  Breasts: No masses or discharge.  Symmetric.  No axillary adenopathy.  Lungs: Clear to auscultation.No rales or wheezes. Normal Respiratory effort, no retractions.  Heart: NSR.  No murmurs or rubs appreciated. No periferal edema  Abdomen: Soft.  Non-tender.  No masses.  No HSM. No hernia  Extremities: Moves all appropriately.  Normal ROM for age. No lymphadenopathy.  Neuro: Oriented to PPT.  Normal mood. Normal affect.     Pelvic:   Vulva: Normal appearance.  No lesions.  Vagina: No lesions or abnormalities noted.  Support: Normal pelvic support.  Urethra No masses tenderness or scarring.  Meatus Normal size without lesions or prolapse.  Cervix: Normal appearance.  No lesions.  IUD strings noted at cervical os  Anus: Normal exam.  No lesions.  Perineum: Normal exam.  No lesions.        Bimanual   Uterus: Normal size.  Non-tender.  Mobile.  AV.  Adnexae: No masses.  Non-tender to palpation.  Cul-de-sac: Negative for abnormality.   Exam limited by patient body habitus  Assessment:    No obstetric history on file. Patient Active Problem List   Diagnosis Date Noted  . Disorder of skin of trunk 03/18/2018  . Displacement of lumbar intervertebral disc without myelopathy 03/18/2018  . Inflammation of sacroiliac joint ([place]) 03/18/2018  . Low back pain 03/18/2018  . Vitamin D deficiency 05/15/2017  . DUB (dysfunctional uterine bleeding) 03/21/2016  . Left ovarian cyst 03/19/2016  . Abnormal bleeding in menstrual cycle 03/10/2016  . Left elbow tendonitis 11/28/2015  . Fatty liver 07/16/2015  . Migraine 07/16/2015  . HTN (hypertension) 03/09/2015  . Obesity 03/09/2015     1. Well woman exam with routine gynecological exam   2. Class 3 severe obesity due to excess calories without serious comorbidity with body mass index (BMI) of 45.0 to 49.9 in adult [facility])     Patient  doing very well with IUD.   Plan:            1.  Basic Screening Recommendations The basic screening recommendations for asymptomatic women were discussed with the patient during her visit.  The age-appropriate recommendations were discussed with her and the rational for the tests reviewed.  When I am informed by the patient that another primary care physician has previously obtained the age-appropriate tests and they are up-to-date, only outstanding tests are ordered and referrals given as necessary.  Abnormal results of tests will be discussed with her when all of her results are completed.  Routine preventative health maintenance measures emphasized: Exercise/Diet/Weight control, Tobacco Warnings, Alcohol/Substance use risks and Stress Management Pap performed today-mammogram ordered. Orders Orders Placed This Encounter  Procedures  . MM 3D SCREEN BREAST BILATERAL    No orders of the defined types were placed in this encounter.       F/U  Return in about 1 year (around 11/03/2020) for Annual Physical.  Finis Bud, M.D. 11/04/2019 8:37 AM

## 2019-11-14 LAB — CYTOLOGY - PAP
Comment: NEGATIVE
Diagnosis: NEGATIVE
High risk HPV: NEGATIVE

## 2019-11-24 ENCOUNTER — Ambulatory Visit (INDEPENDENT_AMBULATORY_CARE_PROVIDER_SITE_OTHER): Payer: 59 | Admitting: Family Medicine

## 2019-11-24 ENCOUNTER — Other Ambulatory Visit: Payer: Self-pay

## 2019-11-24 ENCOUNTER — Encounter: Payer: Self-pay | Admitting: Family Medicine

## 2019-11-24 VITALS — BP 122/76 | HR 60 | Temp 98.4°F | Ht 62.0 in | Wt 245.0 lb

## 2019-11-24 DIAGNOSIS — Z0000 Encounter for general adult medical examination without abnormal findings: Secondary | ICD-10-CM

## 2019-11-24 DIAGNOSIS — M5126 Other intervertebral disc displacement, lumbar region: Secondary | ICD-10-CM | POA: Diagnosis not present

## 2019-11-24 DIAGNOSIS — I10 Essential (primary) hypertension: Secondary | ICD-10-CM | POA: Diagnosis not present

## 2019-11-24 DIAGNOSIS — Z23 Encounter for immunization: Secondary | ICD-10-CM

## 2019-11-24 MED ORDER — AMLODIPINE BESYLATE 5 MG PO TABS: 5.0000 mg | ORAL_TABLET | Freq: Every day | ORAL | 1 refills | Status: DC

## 2019-11-24 MED ORDER — ATENOLOL 25 MG PO TABS: 25.0000 mg | ORAL_TABLET | Freq: Every day | ORAL | 1 refills | Status: DC

## 2019-11-24 MED ORDER — OLMESARTAN MEDOXOMIL 40 MG PO TABS: 40.0000 mg | ORAL_TABLET | Freq: Every day | ORAL | 1 refills | Status: DC

## 2019-11-24 NOTE — Assessment & Plan Note (Signed)
BPs stable and WNL, continue current regimen 

## 2019-11-24 NOTE — Progress Notes (Signed)
BP 122/76   Pulse 60   Temp 98.4 F (36.9 C) (Oral)   Ht 5\' 2"  (1.575 m)   Wt 245 lb (111.1 kg)   LMP 10/28/2019   SpO2 98%   BMI 44.81 kg/m    Subjective:    Patient ID: Victoria Bowers, female    DOB: 1971/08/21, 49 y.o.   MRN: [id number]  HPI: Victoria Bowers is a 49 y.o. female presenting on 11/24/2019 for comprehensive medical examination. Current medical complaints include:see below  HTN - Home BPs running 120s/70s. Taking medications faithfully without side effects. Denies CP, SOB, HAs, dizziness.   Worsening back aching lately, seems to get better once she gets up and moving around. Taking OTC pain relievers prn.   She currently lives with: Menopausal Symptoms: no  Depression Screen done today and results listed below:  Depression screen [facility] 2/9 11/24/2019 11/22/2018 03/18/2018 11/17/2017 11/14/2016  Decreased Interest 0 0 0 0 0  Down, Depressed, Hopeless 0 0 0 0 0  PHQ - 2 Score 0 0 0 0 0  Altered sleeping 0 - - - -  Tired, decreased energy 0 - - - -  Change in appetite 2 - - - -  Feeling bad or failure about yourself  0 - - - -  Trouble concentrating 0 - - - -  Moving slowly or fidgety/restless 0 - - - -  Suicidal thoughts 0 - - - -  PHQ-9 Score 2 - - - -    The patient does not have a history of falls. I did complete a risk assessment for falls. A plan of care for falls was documented.   Past Medical History:  Past Medical History:  Diagnosis Date  . Allergy   . Bertolotti's syndrome   . Complication of anesthesia   . Fatty liver   . Fibroid   . GERD (gastroesophageal reflux disease)    OCC  . Hypertension   . Ovarian cyst   . PONV (postoperative nausea and vomiting)    NAUSEATED    Surgical History:  Past Surgical History:  Procedure Laterality Date  . CERVICAL POLYPECTOMY    . CHOLECYSTECTOMY    . HYSTEROSCOPY WITH D & C N/A 03/28/2019   Procedure: DILATATION AND CURETTAGE /HYSTEROSCOPY;  Surgeon: Harlin Heys, MD;  Location: [facility]  ORS;  Service: Gynecology;  Laterality: N/A;  . INTRAUTERINE DEVICE (IUD) INSERTION N/A 03/28/2019   Procedure: INTRAUTERINE DEVICE (IUD) INSERTION;  Surgeon: Harlin Heys, MD;  Location: [facility] ORS;  Service: Gynecology;  Laterality: N/A;  . NASAL SINUS SURGERY  X2  . SPINE SURGERY  ruptured disc   LUMBAR  . TONSILLECTOMY AND ADENOIDECTOMY  1985    Medications:  Current Outpatient Medications on File Prior to Visit  Medication Sig  . alum & mag hydroxide-simeth (MYLANTA) 200-200-20 MG/5ML suspension Take 15 mLs by mouth every 6 (six) hours as needed for indigestion or heartburn.  . fluticasone (FLONASE) 50 MCG/ACT nasal spray Place 2 sprays into both nostrils every morning.  Marland Kitchen levonorgestrel (MIRENA) 20 MCG/24HR IUD 1 each by Intrauterine route once.   No current facility-administered medications on file prior to visit.    Allergies:  Allergies  Allergen Reactions  . Sulfa Antibiotics Hives  . Sulfur Hives    Social History:  Social History   Socioeconomic History  . Marital status: Married    Spouse name: Not on file  . Number of children: Not on file  . Years of education: Not  on file  . Highest education level: Not on file  Occupational History  . Not on file  Tobacco Use  . Smoking status: Never Smoker  . Smokeless tobacco: Never Used  Substance and Sexual Activity  . Alcohol use: No    Alcohol/week: 0.0 standard drinks  . Drug use: No  . Sexual activity: Never  Other Topics Concern  . Not on file  Social History Narrative  . Not on file   Social Determinants of Health   Financial Resource Strain:   . Difficulty of Paying Living Expenses: Not on file  Food Insecurity:   . Worried About Charity fundraiser in the Last Year: Not on file  . Ran Out of Food in the Last Year: Not on file  Transportation Needs:   . Lack of Transportation (Medical): Not on file  . Lack of Transportation (Non-Medical): Not on file  Physical Activity:   . Days of Exercise  per Week: Not on file  . Minutes of Exercise per Session: Not on file  Stress:   . Feeling of Stress : Not on file  Social Connections:   . Frequency of Communication with Friends and Family: Not on file  . Frequency of Social Gatherings with Friends and Family: Not on file  . Attends Religious Services: Not on file  . Active Member of Clubs or Organizations: Not on file  . Attends Archivist Meetings: Not on file  . Marital Status: Not on file  Intimate Partner Violence:   . Fear of Current or Ex-Partner: Not on file  . Emotionally Abused: Not on file  . Physically Abused: Not on file  . Sexually Abused: Not on file   Social History   Tobacco Use  Smoking Status Never Smoker  Smokeless Tobacco Never Used   Social History   Substance and Sexual Activity  Alcohol Use No  . Alcohol/week: 0.0 standard drinks    Family History:  Family History  Problem Relation Age of Onset  . Hypertension Father   . Hypertension Brother   . Hypertension Paternal Grandmother   . Hypertension Brother   . Heart disease Mother     Past medical history, surgical history, medications, allergies, family history and social history reviewed with patient today and changes made to appropriate areas of the chart.   Review of Systems - General ROS: negative Psychological ROS: negative Ophthalmic ROS: negative ENT ROS: negative Allergy and Immunology ROS: negative Hematological and Lymphatic ROS: negative Endocrine ROS: negative Breast ROS: negative for breast lumps Respiratory ROS: no cough, shortness of breath, or wheezing Cardiovascular ROS: no chest pain or dyspnea on exertion Gastrointestinal ROS: no abdominal pain, change in bowel habits, or black or bloody stools Genito-Urinary ROS: no dysuria, trouble voiding, or hematuria Musculoskeletal ROS: negative Neurological ROS: no TIA or stroke symptoms Dermatological ROS: negative All other ROS negative except what is listed above  and in the HPI.      Objective:    BP 122/76   Pulse 60   Temp 98.4 F (36.9 C) (Oral)   Ht 5\' 2"  (1.575 m)   Wt 245 lb (111.1 kg)   LMP 10/28/2019   SpO2 98%   BMI 44.81 kg/m   Wt Readings from Last 3 Encounters:  11/24/19 245 lb (111.1 kg)  11/04/19 247 lb 14.4 oz (112.4 kg)  05/04/19 244 lb (110.7 kg)    Physical Exam Vitals and nursing note reviewed.  Constitutional:      General: She  is not in acute distress.    Appearance: She is well-developed.  HENT:     Head: Atraumatic.     Right Ear: External ear normal.     Left Ear: External ear normal.     Nose: Nose normal.     Mouth/Throat:     Pharynx: No oropharyngeal exudate.  Eyes:     General: No scleral icterus.    Conjunctiva/sclera: Conjunctivae normal.     Pupils: Pupils are equal, round, and reactive to light.  Neck:     Thyroid: No thyromegaly.  Cardiovascular:     Rate and Rhythm: Normal rate and regular rhythm.     Heart sounds: Normal heart sounds.  Pulmonary:     Effort: Pulmonary effort is normal. No respiratory distress.     Breath sounds: Normal breath sounds.  Chest:     Comments: Breast exam done through GYN Abdominal:     General: Bowel sounds are normal.     Palpations: Abdomen is soft. There is no mass.     Tenderness: There is no abdominal tenderness.  Genitourinary:    Comments: GU exam done through GYN Musculoskeletal:        General: No tenderness. Normal range of motion.     Cervical back: Normal range of motion and neck supple.  Lymphadenopathy:     Cervical: No cervical adenopathy.  Skin:    General: Skin is warm and dry.     Findings: No rash.  Neurological:     Mental Status: She is alert and oriented to person, place, and time.     Cranial Nerves: No cranial nerve deficit.  Psychiatric:        Behavior: Behavior normal.     Results for orders placed or performed in visit on 11/24/19  Microscopic Examination   URINE  Result Value Ref Range   WBC, UA 0-5 0 - 5 /hpf     RBC None seen 0 - 2 /hpf   Epithelial Cells (non renal) 0-10 0 - 10 /hpf   Casts Present None seen /lpf   Cast Type Hyaline casts N/A   Mucus, UA Present Not Estab.   Bacteria, UA None seen None seen/Few  Urine Culture, Reflex   URINE  Result Value Ref Range   Urine Culture, Routine WILL FOLLOW   UA/M w/rflx Culture, Routine   Specimen: Urine   URINE  Result Value Ref Range   Specific Gravity, UA 1.020 1.005 - 1.030   pH, UA 7.0 5.0 - 7.5   Color, UA Yellow Yellow   Appearance Ur Clear Clear   Leukocytes,UA 1+ (A) Negative   Protein,UA Negative Negative/Trace   Glucose, UA Negative Negative   Ketones, UA Negative Negative   RBC, UA Negative Negative   Bilirubin, UA Negative Negative   Urobilinogen, Ur 0.2 0.2 - 1.0 mg/dL   Nitrite, UA Negative Negative   Microscopic Examination See below:    Urinalysis Reflex Comment       Assessment & Plan:   Problem List Items Addressed This Visit      Cardiovascular and Mediastinum   HTN (hypertension) - Primary    BPs stable and WNL, continue current regimen      Relevant Medications   olmesartan (BENICAR) 40 MG tablet   atenolol (TENORMIN) 25 MG tablet   amLODipine (NORVASC) 5 MG tablet   Other Relevant Orders   CBC with Differential/Platelet   Comprehensive metabolic panel   TSH   UA/M w/rflx Culture, Routine (Completed)  Musculoskeletal and Integument   Displacement of lumbar intervertebral disc without myelopathy    Other Visit Diagnoses    Annual physical exam       Relevant Orders   Lipid Panel w/o Chol/HDL Ratio   Need for diphtheria-tetanus-pertussis (Tdap) vaccine       Relevant Orders   Tdap vaccine greater than or equal to 7yo IM (Completed)       Follow up plan: Return in about 6 months (around 05/23/2020) for 6 month f/u.   LABORATORY TESTING:  - Pap smear: up to date  IMMUNIZATIONS:   - Tdap: Tetanus vaccination status reviewed: last tetanus booster within 10 years. - Influenza:  Refused  SCREENING: -Mammogram: Ordered today  - Colonoscopy: Refused   PATIENT COUNSELING:   Advised to take 1 mg of folate supplement per day if capable of pregnancy.   Sexuality: Discussed sexually transmitted diseases, partner selection, use of condoms, avoidance of unintended pregnancy  and contraceptive alternatives.   Advised to avoid cigarette smoking.  I discussed with the patient that most people either abstain from alcohol or drink within safe limits (<=14/week and <=4 drinks/occasion for males, <=7/weeks and <= 3 drinks/occasion for females) and that the risk for alcohol disorders and other health effects rises proportionally with the number of drinks per week and how often a drinker exceeds daily limits.  Discussed cessation/primary prevention of drug use and availability of treatment for abuse.   Diet: Encouraged to adjust caloric intake to maintain  or achieve ideal body weight, to reduce intake of dietary saturated fat and total fat, to limit sodium intake by avoiding high sodium foods and not adding table salt, and to maintain adequate dietary potassium and calcium preferably from fresh fruits, vegetables, and low-fat dairy products.    stressed the importance of regular exercise  Injury prevention: Discussed safety belts, safety helmets, smoke detector, smoking near bedding or upholstery.   Dental health: Discussed importance of regular tooth brushing, flossing, and dental visits.    NEXT PREVENTATIVE PHYSICAL DUE IN 1 YEAR. Return in about 6 months (around 05/23/2020) for 6 month f/u.

## 2019-11-25 LAB — COMPREHENSIVE METABOLIC PANEL
ALT: 13 IU/L (ref 0–32)
AST: 19 IU/L (ref 0–40)
Albumin/Globulin Ratio: 1.7 (ref 1.2–2.2)
Albumin: 4 g/dL (ref 3.8–4.8)
Alkaline Phosphatase: 87 IU/L (ref 39–117)
BUN/Creatinine Ratio: 15 (ref 9–23)
BUN: 13 mg/dL (ref 6–24)
Bilirubin Total: 0.2 mg/dL (ref 0.0–1.2)
CO2: 22 mmol/L (ref 20–29)
Calcium: 9 mg/dL (ref 8.7–10.2)
Chloride: 101 mmol/L (ref 96–106)
Creatinine, Ser: 0.85 mg/dL (ref 0.57–1.00)
GFR calc Af Amer: 92 mL/min/{1.73_m2} (ref >59)
GFR calc non Af Amer: 80 mL/min/{1.73_m2} (ref >59)
Globulin, Total: 2.4 g/dL (ref 1.5–4.5)
Glucose: 107 mg/dL — ABNORMAL HIGH (ref 65–99)
Potassium: 4.8 mmol/L (ref 3.5–5.2)
Sodium: 136 mmol/L (ref 134–144)
Total Protein: 6.4 g/dL (ref 6.0–8.5)

## 2019-11-25 LAB — CBC WITH DIFFERENTIAL/PLATELET
Basophils Absolute: 0.1 10*3/uL (ref 0.0–0.2)
Basos: 1 %
EOS (ABSOLUTE): 0.3 10*3/uL (ref 0.0–0.4)
Eos: 3 %
Hematocrit: 38.2 % (ref 34.0–46.6)
Hemoglobin: 12.9 g/dL (ref 11.1–15.9)
Immature Grans (Abs): 0 10*3/uL (ref 0.0–0.1)
Immature Granulocytes: 0 %
Lymphocytes Absolute: 2.1 10*3/uL (ref 0.7–3.1)
Lymphs: 21 %
MCH: 30.1 pg (ref 26.6–33.0)
MCHC: 33.8 g/dL (ref 31.5–35.7)
MCV: 89 fL (ref 79–97)
Monocytes Absolute: 0.7 10*3/uL (ref 0.1–0.9)
Monocytes: 7 %
Neutrophils Absolute: 6.8 10*3/uL (ref 1.4–7.0)
Neutrophils: 68 %
Platelets: 347 10*3/uL (ref 150–450)
RBC: 4.28 x10E6/uL (ref 3.77–5.28)
RDW: 12.9 % (ref 11.7–15.4)
WBC: 9.9 10*3/uL (ref 3.4–10.8)

## 2019-11-25 LAB — LIPID PANEL W/O CHOL/HDL RATIO
Cholesterol, Total: 151 mg/dL (ref 100–199)
HDL: 48 mg/dL (ref >39)
LDL Chol Calc (NIH): 79 mg/dL (ref 0–99)
Triglycerides: 135 mg/dL (ref 0–149)
VLDL Cholesterol Cal: 24 mg/dL (ref 5–40)

## 2019-11-25 LAB — TSH: TSH: 2.94 u[IU]/mL (ref 0.450–4.500)

## 2019-11-26 LAB — MICROSCOPIC EXAMINATION
Bacteria, UA: NONE SEEN
RBC, Urine: NONE SEEN /hpf (ref 0–2)

## 2019-11-26 LAB — UA/M W/RFLX CULTURE, ROUTINE
Bilirubin, UA: NEGATIVE
Glucose, UA: NEGATIVE
Ketones, UA: NEGATIVE
Nitrite, UA: NEGATIVE
Protein,UA: NEGATIVE
RBC, UA: NEGATIVE
Specific Gravity, UA: 1.02 (ref 1.005–1.030)
Urobilinogen, Ur: 0.2 mg/dL (ref 0.2–1.0)
pH, UA: 7 (ref 5.0–7.5)

## 2019-11-26 LAB — URINE CULTURE, REFLEX

## 2020-02-17 ENCOUNTER — Other Ambulatory Visit: Payer: Self-pay

## 2020-02-17 ENCOUNTER — Ambulatory Visit: Payer: 59 | Attending: Oncology

## 2020-02-17 DIAGNOSIS — Z23 Encounter for immunization: Secondary | ICD-10-CM

## 2020-02-17 NOTE — Progress Notes (Signed)
   Covid-19 Vaccination Clinic  Name:  Victoria Bowers    MRN: [id number] DOB: 03-09-1971  02/17/2020  Victoria Bowers was observed post Covid-19 immunization for 15 minutes without incident. She was provided with Vaccine Information Sheet and instruction to access the V-Safe system.   Victoria Bowers was instructed to call 911 with any severe reactions post vaccine: Marland Kitchen Difficulty breathing  . Swelling of face and throat  . A fast heartbeat  . A bad rash all over body  . Dizziness and weakness   Immunizations Administered    Name Date Dose VIS Date Route   Pfizer COVID-19 Vaccine 02/17/2020 11:30 AM 0.3 mL 11/23/2018 Intramuscular   Manufacturer: [place]   Lot: [id number]   [place]: [id number]

## 2020-03-09 ENCOUNTER — Ambulatory Visit: Payer: 59 | Attending: Oncology

## 2020-03-09 ENCOUNTER — Other Ambulatory Visit: Payer: Self-pay

## 2020-03-09 DIAGNOSIS — Z23 Encounter for immunization: Secondary | ICD-10-CM

## 2020-03-09 NOTE — Progress Notes (Signed)
   Covid-19 Vaccination Clinic  Name:  Victoria Bowers    MRN: [id number] DOB: 04-30-1971  03/09/2020  [name] was observed post Covid-19 immunization for 15 minutes without incident. She was provided with Vaccine Information Sheet and instruction to access the V-Safe system.   [name] was instructed to call 911 with any severe reactions post vaccine: Marland Kitchen Difficulty breathing  . Swelling of face and throat  . A fast heartbeat  . A bad rash all over body  . Dizziness and weakness   Immunizations Administered    Name Date Dose VIS Date Route   Pfizer COVID-19 Vaccine 03/09/2020 11:15 AM 0.3 mL 11/23/2018 Intramuscular   Manufacturer: [place]   Lot: [id number]   [place]: [id number]

## 2020-05-25 ENCOUNTER — Ambulatory Visit: Payer: 59 | Admitting: Family Medicine

## 2020-06-21 ENCOUNTER — Other Ambulatory Visit: Payer: Self-pay | Admitting: Family Medicine

## 2020-06-21 NOTE — Telephone Encounter (Signed)
Routing to provider  

## 2020-06-21 NOTE — Telephone Encounter (Signed)
Needs appt

## 2020-06-21 NOTE — Telephone Encounter (Signed)
Requested  medications are  due for refill today yes  Requested medications are on the active medication list yes  Last refill 03/20/20  Last visit Feb 2021  Future visit scheduled no  Notes to clinic Failed protocol of visit within 6 months

## 2020-06-22 NOTE — Telephone Encounter (Signed)
Called pt scheduled 9/30 to see Janett Billow

## 2020-06-28 ENCOUNTER — Other Ambulatory Visit: Payer: Self-pay

## 2020-06-28 ENCOUNTER — Encounter: Payer: Self-pay | Admitting: Nurse Practitioner

## 2020-06-28 ENCOUNTER — Ambulatory Visit (INDEPENDENT_AMBULATORY_CARE_PROVIDER_SITE_OTHER): Payer: 59 | Admitting: Nurse Practitioner

## 2020-06-28 VITALS — BP 132/78 | HR 64 | Temp 98.8°F | Wt 241.6 lb

## 2020-06-28 DIAGNOSIS — I10 Essential (primary) hypertension: Secondary | ICD-10-CM

## 2020-06-28 MED ORDER — OLMESARTAN MEDOXOMIL 40 MG PO TABS: 40.0000 mg | ORAL_TABLET | Freq: Every day | ORAL | 1 refills | Status: DC

## 2020-06-28 MED ORDER — ATENOLOL 25 MG PO TABS: 25.0000 mg | ORAL_TABLET | Freq: Every day | ORAL | 1 refills | Status: DC

## 2020-06-28 MED ORDER — AMLODIPINE BESYLATE 5 MG PO TABS: 5.0000 mg | ORAL_TABLET | Freq: Every day | ORAL | 1 refills | Status: DC

## 2020-06-28 NOTE — Assessment & Plan Note (Signed)
Chronic, stable on amlodipine 5 mg, Benicar 40 mg daily, and atenolol 25 mg daily.  BP at goal today in clinic.  Encouraged patient to monitor BP at home and bring log to next appointment.  Goal <130/80.  Will check BMP and kidney function/electrolytes today.  Continue current regimen for now, refills sent into pharmacy.

## 2020-06-28 NOTE — Patient Instructions (Signed)
DASH Eating Plan DASH stands for "Dietary Approaches to Stop Hypertension." The DASH eating plan is a healthy eating plan that has been shown to reduce high blood pressure (hypertension). It may also reduce your risk for type 2 diabetes, heart disease, and stroke. The DASH eating plan may also help with weight loss. What are tips for following this plan?  General guidelines  Avoid eating more than 2,300 mg (milligrams) of salt (sodium) a day. If you have hypertension, you may need to reduce your sodium intake to 1,500 mg a day.  Limit alcohol intake to no more than 1 drink a day for nonpregnant women and 2 drinks a day for men. One drink equals 12 oz of beer, 5 oz of wine, or 1 oz of hard liquor.  Work with your health care provider to maintain a healthy body weight or to lose weight. Ask what an ideal weight is for you.  Get at least 30 minutes of exercise that causes your heart to beat faster (aerobic exercise) most days of the week. Activities may include walking, swimming, or biking.  Work with your health care provider or diet and nutrition specialist (dietitian) to adjust your eating plan to your individual calorie needs. Reading food labels   Check food labels for the amount of sodium per serving. Choose foods with less than 5 percent of the Daily Value of sodium. Generally, foods with less than 300 mg of sodium per serving fit into this eating plan.  To find whole grains, look for the word "whole" as the first word in the ingredient list. Shopping  Buy products labeled as "low-sodium" or "no salt added."  Buy fresh foods. Avoid canned foods and premade or frozen meals. Cooking  Avoid adding salt when cooking. Use salt-free seasonings or herbs instead of table salt or sea salt. Check with your health care provider or pharmacist before using salt substitutes.  Do not fry foods. Cook foods using healthy methods such as baking, boiling, grilling, and broiling instead.  Cook with  heart-healthy oils, such as olive, canola, soybean, or sunflower oil. Meal planning  Eat a balanced diet that includes: ? 5 or more servings of fruits and vegetables each day. At each meal, try to fill half of your plate with fruits and vegetables. ? Up to 6-8 servings of whole grains each day. ? Less than 6 oz of lean meat, poultry, or fish each day. A 3-oz serving of meat is about the same size as a deck of cards. One egg equals 1 oz. ? 2 servings of low-fat dairy each day. ? A serving of nuts, seeds, or beans 5 times each week. ? Heart-healthy fats. Healthy fats called Omega-3 fatty acids are found in foods such as flaxseeds and coldwater fish, like sardines, salmon, and mackerel.  Limit how much you eat of the following: ? Canned or prepackaged foods. ? Food that is high in trans fat, such as fried foods. ? Food that is high in saturated fat, such as fatty meat. ? Sweets, desserts, sugary drinks, and other foods with added sugar. ? Full-fat dairy products.  Do not salt foods before eating.  Try to eat at least 2 vegetarian meals each week.  Eat more home-cooked food and less restaurant, buffet, and fast food.  When eating at a restaurant, ask that your food be prepared with less salt or no salt, if possible. What foods are recommended? The items listed may not be a complete list. Talk with your dietitian about   what dietary choices are best for you. Grains Whole-grain or whole-wheat bread. Whole-grain or whole-wheat pasta. Brown rice. Oatmeal. Quinoa. Bulgur. Whole-grain and low-sodium cereals. Pita bread. Low-fat, low-sodium crackers. Whole-wheat flour tortillas. Vegetables Fresh or frozen vegetables (raw, steamed, roasted, or grilled). Low-sodium or reduced-sodium tomato and vegetable juice. Low-sodium or reduced-sodium tomato sauce and tomato paste. Low-sodium or reduced-sodium canned vegetables. Fruits All fresh, dried, or frozen fruit. Canned fruit in natural juice (without  added sugar). Meat and other protein foods Skinless chicken or turkey. Ground chicken or turkey. Pork with fat trimmed off. Fish and seafood. Egg whites. Dried beans, peas, or lentils. Unsalted nuts, nut butters, and seeds. Unsalted canned beans. Lean cuts of beef with fat trimmed off. Low-sodium, lean deli meat. Dairy Low-fat (1%) or fat-free (skim) milk. Fat-free, low-fat, or reduced-fat cheeses. Nonfat, low-sodium ricotta or cottage cheese. Low-fat or nonfat yogurt. Low-fat, low-sodium cheese. Fats and oils Soft margarine without trans fats. Vegetable oil. Low-fat, reduced-fat, or light mayonnaise and salad dressings (reduced-sodium). Canola, safflower, olive, soybean, and sunflower oils. Avocado. Seasoning and other foods Herbs. Spices. Seasoning mixes without salt. Unsalted popcorn and pretzels. Fat-free sweets. What foods are not recommended? The items listed may not be a complete list. Talk with your dietitian about what dietary choices are best for you. Grains Baked goods made with fat, such as croissants, muffins, or some breads. Dry pasta or rice meal packs. Vegetables Creamed or fried vegetables. Vegetables in a cheese sauce. Regular canned vegetables (not low-sodium or reduced-sodium). Regular canned tomato sauce and paste (not low-sodium or reduced-sodium). Regular tomato and vegetable juice (not low-sodium or reduced-sodium). Pickles. Olives. Fruits Canned fruit in a light or heavy syrup. Fried fruit. Fruit in cream or butter sauce. Meat and other protein foods Fatty cuts of meat. Ribs. Fried meat. Bacon. Sausage. Bologna and other processed lunch meats. Salami. Fatback. Hotdogs. Bratwurst. Salted nuts and seeds. Canned beans with added salt. Canned or smoked fish. Whole eggs or egg yolks. Chicken or turkey with skin. Dairy Whole or 2% milk, cream, and half-and-half. Whole or full-fat cream cheese. Whole-fat or sweetened yogurt. Full-fat cheese. Nondairy creamers. Whipped toppings.  Processed cheese and cheese spreads. Fats and oils Butter. Stick margarine. Lard. Shortening. Ghee. Bacon fat. Tropical oils, such as coconut, palm kernel, or palm oil. Seasoning and other foods Salted popcorn and pretzels. Onion salt, garlic salt, seasoned salt, table salt, and sea salt. Worcestershire sauce. Tartar sauce. Barbecue sauce. Teriyaki sauce. Soy sauce, including reduced-sodium. Steak sauce. Canned and packaged gravies. Fish sauce. Oyster sauce. Cocktail sauce. Horseradish that you find on the shelf. Ketchup. Mustard. Meat flavorings and tenderizers. Bouillon cubes. Hot sauce and Tabasco sauce. Premade or packaged marinades. Premade or packaged taco seasonings. Relishes. Regular salad dressings. Where to find more information:  National Heart, Lung, and Blood Institute: www.nhlbi.nih.gov  American Heart Association: www.heart.org Summary  The DASH eating plan is a healthy eating plan that has been shown to reduce high blood pressure (hypertension). It may also reduce your risk for type 2 diabetes, heart disease, and stroke.  With the DASH eating plan, you should limit salt (sodium) intake to 2,300 mg a day. If you have hypertension, you may need to reduce your sodium intake to 1,500 mg a day.  When on the DASH eating plan, aim to eat more fresh fruits and vegetables, whole grains, lean proteins, low-fat dairy, and heart-healthy fats.  Work with your health care provider or diet and nutrition specialist (dietitian) to adjust your eating plan to your   individual calorie needs. This information is not intended to replace advice given to you by your health care provider. Make sure you discuss any questions you have with your health care provider. Document Revised: 08/28/2017 Document Reviewed: 09/08/2016 Elsevier Patient Education  2020 Elsevier Inc.  

## 2020-06-28 NOTE — Progress Notes (Signed)
BP 132/78   Pulse 64   Temp 98.8 F (37.1 C) (Oral)   Wt 241 lb 9.6 oz (109.6 kg)   SpO2 99%   BMI 44.19 kg/m    Subjective:    Patient ID: Victoria Bowers, female    DOB: [birth year], 49 y.o.   MRN: [id number]  HPI: Victoria Bowers is a 49 y.o. female presenting for follow up.  Chief Complaint  Patient presents with  . Hypertension   HYPERTENSION Hypertension status: controlled  Satisfied with current treatment? no Duration of hypertension: chronic BP monitoring frequency:  not checking BP medication side effects:  no Medication compliance: excellent compliance Previous BP meds:  Amlodipine 5 mg, atenolol 25 mg, and benicar 40 mg daily Aspirin: no Recurrent headaches: no Visual changes: no Palpitations: no Dyspnea: no Chest pain: no Lower extremity edema: no Dizzy/lightheaded: no  Allergies  Allergen Reactions  . Sulfa Antibiotics Hives  . Sulfur Hives   Outpatient Encounter Medications as of 06/28/2020  Medication Sig  . amLODipine (NORVASC) 5 MG tablet Take 1 tablet (5 mg total) by mouth daily.  Marland Kitchen atenolol (TENORMIN) 25 MG tablet Take 1 tablet (25 mg total) by mouth daily.  . fluticasone (FLONASE) 50 MCG/ACT nasal spray Place 2 sprays into both nostrils every morning.  Marland Kitchen levonorgestrel (MIRENA) 20 MCG/24HR IUD 1 each by Intrauterine route once.  Marland Kitchen olmesartan (BENICAR) 40 MG tablet Take 1 tablet (40 mg total) by mouth daily.  . [DISCONTINUED] amLODipine (NORVASC) 5 MG tablet Take 1 tablet (5 mg total) by mouth daily.  . [DISCONTINUED] atenolol (TENORMIN) 25 MG tablet Take 1 tablet (25 mg total) by mouth daily.  . [DISCONTINUED] olmesartan (BENICAR) 40 MG tablet Take 1 tablet (40 mg total) by mouth daily.  . [DISCONTINUED] alum & mag hydroxide-simeth (MYLANTA) 200-200-20 MG/5ML suspension Take 15 mLs by mouth every 6 (six) hours as needed for indigestion or heartburn.   No facility-administered encounter medications on file as of 06/28/2020.   Patient  Active Problem List   Diagnosis Date Noted  . Disorder of skin of trunk 03/18/2018  . Displacement of lumbar intervertebral disc without myelopathy 03/18/2018  . Inflammation of sacroiliac joint ([place]) 03/18/2018  . Low back pain 03/18/2018  . Vitamin D deficiency 05/15/2017  . DUB (dysfunctional uterine bleeding) 03/21/2016  . Left ovarian cyst 03/19/2016  . Abnormal bleeding in menstrual cycle 03/10/2016  . Left elbow tendonitis 11/28/2015  . Fatty liver 07/16/2015  . Migraine 07/16/2015  . HTN (hypertension) 03/09/2015  . Obesity 03/09/2015   Past Medical History:  Diagnosis Date  . Allergy   . Bertolotti's syndrome   . Complication of anesthesia   . Fatty liver   . Fibroid   . GERD (gastroesophageal reflux disease)    OCC  . Hypertension   . Ovarian cyst   . PONV (postoperative nausea and vomiting)    NAUSEATED   Relevant past medical, surgical, family and social history reviewed and updated as indicated. Interim medical history since our last visit reviewed.  Review of Systems  Constitutional: Negative.  Negative for activity change, appetite change, fatigue and fever.  Eyes: Negative.  Negative for visual disturbance.  Respiratory: Negative.  Negative for cough, chest tightness, shortness of breath and wheezing.   Cardiovascular: Negative for chest pain, palpitations and leg swelling.  Gastrointestinal: Negative.  Negative for abdominal distention, blood in stool, constipation, diarrhea, nausea and vomiting.  Endocrine: Negative.  Negative for polydipsia and polyuria.  Genitourinary: Negative.   Musculoskeletal:  Negative.   Skin: Negative.   Neurological: Negative.  Negative for dizziness, weakness, light-headedness, numbness and headaches.  Psychiatric/Behavioral: Negative.  Negative for behavioral problems, confusion, sleep disturbance and suicidal ideas. The patient is not nervous/anxious.     Per HPI unless specifically indicated above     Objective:    BP  132/78   Pulse 64   Temp 98.8 F (37.1 C) (Oral)   Wt 241 lb 9.6 oz (109.6 kg)   SpO2 99%   BMI 44.19 kg/m   Wt Readings from Last 3 Encounters:  06/28/20 241 lb 9.6 oz (109.6 kg)  11/24/19 245 lb (111.1 kg)  11/04/19 247 lb 14.4 oz (112.4 kg)    Physical Exam Vitals and nursing note reviewed.  Constitutional:      General: She is not in acute distress.    Appearance: Normal appearance. She is obese. She is not toxic-appearing.  HENT:     Head: Normocephalic and atraumatic.     Right Ear: External ear normal.     Left Ear: External ear normal.  Eyes:     General: No scleral icterus.    Extraocular Movements: Extraocular movements intact.  Neck:     Vascular: No carotid bruit.  Cardiovascular:     Rate and Rhythm: Normal rate and regular rhythm.     Heart sounds: Normal heart sounds. No murmur heard.   Pulmonary:     Effort: Pulmonary effort is normal. No respiratory distress.     Breath sounds: Normal breath sounds. No wheezing, rhonchi or rales.  Abdominal:     General: Abdomen is flat. Bowel sounds are normal.     Palpations: Abdomen is soft.     Tenderness: There is no abdominal tenderness.  Musculoskeletal:        General: No swelling or tenderness. Normal range of motion.     Cervical back: Normal range of motion.  Skin:    General: Skin is warm and dry.     Capillary Refill: Capillary refill takes less than 2 seconds.     Coloration: Skin is not jaundiced.     Findings: No bruising.  Neurological:     General: No focal deficit present.     Mental Status: She is alert and oriented to person, place, and time.     Motor: No weakness.     Gait: Gait normal.  Psychiatric:        Mood and Affect: Mood normal.        Behavior: Behavior normal.        Thought Content: Thought content normal.        Judgment: Judgment normal.        Assessment & Plan:   Problem List Items Addressed This Visit      Cardiovascular and Mediastinum   HTN (hypertension) -  Primary    Chronic, stable on amlodipine 5 mg, Benicar 40 mg daily, and atenolol 25 mg daily.  BP at goal today in clinic.  Encouraged patient to monitor BP at home and bring log to next appointment.  Goal <130/80.  Will check BMP and kidney function/electrolytes today.  Continue current regimen for now, refills sent into pharmacy.      Relevant Medications   amLODipine (NORVASC) 5 MG tablet   atenolol (TENORMIN) 25 MG tablet   olmesartan (BENICAR) 40 MG tablet   Other Relevant Orders   Basic Metabolic Panel (BMET)       Follow up plan: Return in about 6 months (around  12/26/2020) for cpe with fasting labs.

## 2020-06-29 LAB — BASIC METABOLIC PANEL
BUN/Creatinine Ratio: 17 (ref 9–23)
BUN: 14 mg/dL (ref 6–24)
CO2: 22 mmol/L (ref 20–29)
Calcium: 9.3 mg/dL (ref 8.7–10.2)
Chloride: 98 mmol/L (ref 96–106)
Creatinine, Ser: 0.83 mg/dL (ref 0.57–1.00)
GFR calc Af Amer: 94 mL/min/{1.73_m2} (ref >59)
GFR calc non Af Amer: 82 mL/min/{1.73_m2} (ref >59)
Glucose: 92 mg/dL (ref 65–99)
Potassium: 4.7 mmol/L (ref 3.5–5.2)
Sodium: 134 mmol/L (ref 134–144)

## 2020-09-09 ENCOUNTER — Other Ambulatory Visit: Payer: Self-pay

## 2020-09-09 ENCOUNTER — Emergency Department: Payer: 59

## 2020-09-09 ENCOUNTER — Encounter: Payer: Self-pay | Admitting: Emergency Medicine

## 2020-09-09 ENCOUNTER — Inpatient Hospital Stay
Admission: EM | Admit: 2020-09-09 | Discharge: 2020-09-11 | DRG: 392 | Disposition: A | Discharge status: Home / Self Care | Payer: 59 | Attending: Internal Medicine | Admitting: Internal Medicine

## 2020-09-09 DIAGNOSIS — I10 Essential (primary) hypertension: Secondary | ICD-10-CM | POA: Diagnosis present

## 2020-09-09 DIAGNOSIS — Z8249 Family history of ischemic heart disease and other diseases of the circulatory system: Secondary | ICD-10-CM | POA: Diagnosis not present

## 2020-09-09 DIAGNOSIS — R1032 Left lower quadrant pain: Secondary | ICD-10-CM

## 2020-09-09 DIAGNOSIS — K219 Gastro-esophageal reflux disease without esophagitis: Secondary | ICD-10-CM | POA: Diagnosis present

## 2020-09-09 DIAGNOSIS — Z6841 Body Mass Index (BMI) 40.0 and over, adult: Secondary | ICD-10-CM

## 2020-09-09 DIAGNOSIS — Z20822 Contact with and (suspected) exposure to covid-19: Secondary | ICD-10-CM | POA: Diagnosis present

## 2020-09-09 DIAGNOSIS — K5732 Diverticulitis of large intestine without perforation or abscess without bleeding: Principal | ICD-10-CM | POA: Diagnosis present

## 2020-09-09 DIAGNOSIS — N390 Urinary tract infection, site not specified: Secondary | ICD-10-CM | POA: Diagnosis not present

## 2020-09-09 DIAGNOSIS — K5792 Diverticulitis of intestine, part unspecified, without perforation or abscess without bleeding: Secondary | ICD-10-CM

## 2020-09-09 DIAGNOSIS — R112 Nausea with vomiting, unspecified: Secondary | ICD-10-CM

## 2020-09-09 DIAGNOSIS — E66813 Obesity, class 3: Secondary | ICD-10-CM

## 2020-09-09 DIAGNOSIS — Z882 Allergy status to sulfonamides status: Secondary | ICD-10-CM | POA: Diagnosis not present

## 2020-09-09 DIAGNOSIS — E669 Obesity, unspecified: Secondary | ICD-10-CM | POA: Diagnosis present

## 2020-09-09 DIAGNOSIS — D72829 Elevated white blood cell count, unspecified: Secondary | ICD-10-CM

## 2020-09-09 LAB — PREGNANCY, URINE: Preg Test, Ur: NEGATIVE

## 2020-09-09 LAB — COMPREHENSIVE METABOLIC PANEL
ALT: 17 U/L (ref 0–44)
AST: 17 U/L (ref 15–41)
Albumin: 3.6 g/dL (ref 3.5–5.0)
Alkaline Phosphatase: 81 U/L (ref 38–126)
Anion gap: 8 (ref 5–15)
BUN: 11 mg/dL (ref 6–20)
CO2: 24 mmol/L (ref 22–32)
Calcium: 8.8 mg/dL — ABNORMAL LOW (ref 8.9–10.3)
Chloride: 100 mmol/L (ref 98–111)
Creatinine, Ser: 0.85 mg/dL (ref 0.44–1.00)
GFR, Estimated: >60 mL/min (ref >60)
Glucose, Bld: 146 mg/dL — ABNORMAL HIGH (ref 70–99)
Potassium: 3.9 mmol/L (ref 3.5–5.1)
Sodium: 132 mmol/L — ABNORMAL LOW (ref 135–145)
Total Bilirubin: 1 mg/dL (ref 0.3–1.2)
Total Protein: 7.6 g/dL (ref 6.5–8.1)

## 2020-09-09 LAB — URINALYSIS, COMPLETE (UACMP) WITH MICROSCOPIC
Bilirubin Urine: NEGATIVE
Glucose, UA: NEGATIVE mg/dL
Ketones, ur: NEGATIVE mg/dL
Nitrite: NEGATIVE
Protein, ur: 30 mg/dL — AB
Specific Gravity, Urine: 1.02 (ref 1.005–1.030)
pH: 5 (ref 5.0–8.0)

## 2020-09-09 LAB — CBC
HCT: 38.1 % (ref 36.0–46.0)
Hemoglobin: 12.7 g/dL (ref 12.0–15.0)
MCH: 29.6 pg (ref 26.0–34.0)
MCHC: 33.3 g/dL (ref 30.0–36.0)
MCV: 88.8 fL (ref 80.0–100.0)
Platelets: 353 10*3/uL (ref 150–400)
RBC: 4.29 MIL/uL (ref 3.87–5.11)
RDW: 12.5 % (ref 11.5–15.5)
WBC: 17.4 10*3/uL — ABNORMAL HIGH (ref 4.0–10.5)
nRBC: 0 % (ref 0.0–0.2)

## 2020-09-09 LAB — RESP PANEL BY RT-PCR (FLU A&B, COVID) ARPGX2
Influenza A by PCR: NEGATIVE
Influenza B by PCR: NEGATIVE
SARS Coronavirus 2 by RT PCR: NEGATIVE

## 2020-09-09 LAB — LIPASE, BLOOD: Lipase: 23 U/L (ref 11–51)

## 2020-09-09 MED ORDER — ONDANSETRON HCL 4 MG/2ML IJ SOLN
4.0000 mg | Freq: Four times a day (QID) | INTRAMUSCULAR | Status: DC | PRN
  Administered 2020-09-09: 4 mg via INTRAVENOUS
  Filled 2020-09-09: qty 2

## 2020-09-09 MED ORDER — MORPHINE SULFATE (PF) 4 MG/ML IV SOLN
4.0000 mg | Freq: Once | INTRAVENOUS | Status: AC
  Administered 2020-09-09: 4 mg via INTRAVENOUS
  Filled 2020-09-09: qty 1

## 2020-09-09 MED ORDER — PIPERACILLIN-TAZOBACTAM 3.375 G IVPB
3.3750 g | Freq: Three times a day (TID) | INTRAVENOUS | Status: DC
  Administered 2020-09-09 – 2020-09-11 (×6): 3.375 g via INTRAVENOUS
  Filled 2020-09-09 (×6): qty 50

## 2020-09-09 MED ORDER — MORPHINE SULFATE (PF) 4 MG/ML IV SOLN
4.0000 mg | INTRAVENOUS | Status: DC | PRN
Start: 2020-09-09 — End: 2020-09-11

## 2020-09-09 MED ORDER — SODIUM CHLORIDE 0.9 % IV BOLUS
1000.0000 mL | Freq: Once | INTRAVENOUS | Status: AC
  Administered 2020-09-09: 1000 mL via INTRAVENOUS

## 2020-09-09 MED ORDER — ONDANSETRON HCL 4 MG/2ML IJ SOLN
4.0000 mg | Freq: Once | INTRAMUSCULAR | Status: AC
  Administered 2020-09-09: 4 mg via INTRAVENOUS
  Filled 2020-09-09: qty 2

## 2020-09-09 MED ORDER — IOHEXOL 300 MG/ML  SOLN
100.0000 mL | Freq: Once | INTRAMUSCULAR | Status: AC | PRN
  Administered 2020-09-09: 100 mL via INTRAVENOUS

## 2020-09-09 MED ORDER — FLUTICASONE PROPIONATE 50 MCG/ACT NA SUSP
2.0000 | NASAL | Status: DC
  Filled 2020-09-09: qty 16

## 2020-09-09 MED ORDER — ENOXAPARIN SODIUM 60 MG/0.6ML ~~LOC~~ SOLN
52.5000 mg | SUBCUTANEOUS | Status: DC
  Administered 2020-09-10: 52.5 mg via SUBCUTANEOUS
  Filled 2020-09-09 (×2): qty 0.6

## 2020-09-09 MED ORDER — MORPHINE SULFATE (PF) 2 MG/ML IV SOLN: 2.0000 mg | INTRAVENOUS | Status: DC | PRN

## 2020-09-09 MED ORDER — PANTOPRAZOLE SODIUM 40 MG IV SOLR
40.0000 mg | INTRAVENOUS | Status: DC
  Administered 2020-09-09 – 2020-09-10 (×2): 40 mg via INTRAVENOUS
  Filled 2020-09-09 (×2): qty 40

## 2020-09-09 MED ORDER — IOHEXOL 9 MG/ML PO SOLN
500.0000 mL | Freq: Two times a day (BID) | ORAL | Status: DC | PRN
  Administered 2020-09-09: 500 mL via ORAL
  Filled 2020-09-09: qty 500

## 2020-09-09 MED ORDER — PROMETHAZINE HCL 25 MG/ML IJ SOLN
25.0000 mg | Freq: Once | INTRAMUSCULAR | Status: AC
  Administered 2020-09-09: 25 mg via INTRAMUSCULAR
  Filled 2020-09-09: qty 1

## 2020-09-09 MED ORDER — PIPERACILLIN-TAZOBACTAM 3.375 G IVPB 30 MIN
3.3750 g | Freq: Once | INTRAVENOUS | Status: AC
  Administered 2020-09-09: 3.375 g via INTRAVENOUS
  Filled 2020-09-09: qty 50

## 2020-09-09 MED ORDER — DEXTROSE-NACL 5-0.45 % IV SOLN: INTRAVENOUS | Status: DC

## 2020-09-09 MED ORDER — ONDANSETRON HCL 4 MG PO TABS: 4.0000 mg | ORAL_TABLET | Freq: Four times a day (QID) | ORAL | Status: DC | PRN

## 2020-09-09 MED ORDER — IRBESARTAN 150 MG PO TABS
300.0000 mg | ORAL_TABLET | Freq: Every day | ORAL | Status: DC
  Administered 2020-09-09: 300 mg via ORAL
  Filled 2020-09-09: qty 2

## 2020-09-09 MED ORDER — ATENOLOL 25 MG PO TABS
25.0000 mg | ORAL_TABLET | Freq: Every day | ORAL | Status: DC
  Administered 2020-09-09: 25 mg via ORAL
  Filled 2020-09-09 (×2): qty 1

## 2020-09-09 MED ORDER — MORPHINE SULFATE (PF) 4 MG/ML IV SOLN
4.0000 mg | Freq: Once | INTRAVENOUS | Status: AC
Start: 2020-09-09 — End: 2020-09-09
  Administered 2020-09-09: 4 mg via INTRAVENOUS
  Filled 2020-09-09: qty 1

## 2020-09-09 MED ORDER — AMLODIPINE BESYLATE 5 MG PO TABS
5.0000 mg | ORAL_TABLET | Freq: Every day | ORAL | Status: DC
  Administered 2020-09-09 – 2020-09-11 (×2): 5 mg via ORAL
  Filled 2020-09-09 (×3): qty 1

## 2020-09-09 NOTE — ED Triage Notes (Signed)
Patient with complaint of left lower abdominal pain and nausea that started on Friday. Patient states that she has a history of diverticulitis and this feels similar.

## 2020-09-09 NOTE — Progress Notes (Signed)
PHARMACIST - PHYSICIAN COMMUNICATION  CONCERNING:  Enoxaparin (Lovenox) for DVT Prophylaxis    RECOMMENDATION: Patient was prescribed enoxaprin 40mg  q24 hours for VTE prophylaxis.   Filed Weights   09/09/20 0432  Weight: 104.3 kg (230 lb)    Body mass index is 42.07 kg/m.  Estimated Creatinine Clearance: 88.8 mL/min (by C-G formula based on SCr of 0.85 mg/dL).   Based on [place] patient is candidate for enoxaparin 0.5mg /kg TBW SQ every 24 hours based on BMI being >30.  DESCRIPTION: Pharmacy has adjusted enoxaparin dose per [facility] policy.  Patient is now receiving enoxaparin 52.5mg  every 24 hours    Mande Auvil, PharmD Clinical Pharmacist  09/09/2020 1:54 PM

## 2020-09-09 NOTE — ED Provider Notes (Signed)
-----------------------------------------   8:22 AM on 09/09/2020 -----------------------------------------    Patient requesting additional pain and nausea medications.  Have made requesting nurse for additional Zofran and morphine.  She reports after drinking a bottle and have a contrast feeling nauseated with increasing pain once again in the left lower abdomen.  She is very pleasant, in no severe distress but does appear to have pain and nausea.  Awaiting CT imaging  ----------------------------------------- 12:03 PM on 09/09/2020 -----------------------------------------  CT Abdomen Pelvis W Contrast  Result Date: 09/09/2020 CLINICAL DATA:  LLQ pain X 2 days, hx diverticulitis EXAM: CT ABDOMEN AND PELVIS WITH CONTRAST TECHNIQUE: Multidetector CT imaging of the abdomen and pelvis was performed using the standard protocol following bolus administration of intravenous contrast. CONTRAST:  123mL OMNIPAQUE IOHEXOL 300 MG/ML  SOLN COMPARISON:  03/08/2013 FINDINGS: Lower chest: Lung bases are clear. Hepatobiliary: No focal hepatic lesion. Postcholecystectomy. No biliary dilatation. Pancreas: Pancreas is normal. No ductal dilatation. No pancreatic inflammation. Spleen: Normal spleen Adrenals/urinary tract: Adrenal glands and kidneys are normal. The ureters and bladder normal. Stomach/Bowel: Stomach, small bowel, appendix, and cecum are normal. Several diverticula of the ascending colon. Transverse colon normal. There is a large inflamed diverticulum extending along the anti mesenteric border of the proximal descending colon. The diverticulum measures 12 mm on image 40/3. There is fluid within the RIGHT pericolic gutter at this level and inflammation within the adjacent pericolonic fat (images 35 through 47 series 3). No perforation or abscess formation. The more distal descending colon is normal. There are several scattered diverticula of the sigmoid colon rectum normal. Vascular/Lymphatic: Abdominal aorta  is normal caliber with atherosclerotic calcification. There is no retroperitoneal or periportal lymphadenopathy. No pelvic lymphadenopathy. Reproductive: IUD in expected location in the uterus. No adnexal abnormality Other: No free fluid. Musculoskeletal: No aggressive osseous lesion. IMPRESSION: 1. Acute diverticulitis of the proximal descending colon. Robust inflammatory response surrounding a large inflamed diverticulum. No perforation or abscess. 2. Scattered additional diverticula throughout the colon. 3. Aortic Atherosclerosis (ICD10-I70.0). Electronically Signed   By: Suzy Bouchard M.D.   On: 09/09/2020 11:32      CT imaging acute diverticulitis. No noted perforation or rupture on CT imaging personally viewed by me    Patient having pretty significant and intractable nausea occasional vomiting associated with diverticulitis. Pain is well controlled at this time. Requiring additional antiemetics. EKG is reviewed normal QTC additional Zofran given. Patient and family at bedside both understand agreeable with plan for admission  Case discussed with hospitalist Dr. Reynolds Bowl, MD 09/09/20 [phone]

## 2020-09-09 NOTE — H&P (Addendum)
History and Physical    Victoria Bowers HUD:[id number] DOB: 1971/07/22 DOA: 09/09/2020  PCP: Volney American, PA-C   Patient coming from: Home  I have personally briefly reviewed patient's old medical records in [place]  Chief Complaint: Left lower quadrant pain  HPI: Victoria Bowers is a 49 y.o. female with medical history significant for hypertension, morbid obesity, GERD and diverticulitis who presents to the ER for evaluation of a 2-day history of lower quadrant pain which she describes as a sharp constant pain, rated 6 x 10 in intensity at its worse, nonradiating and associated with nausea, vomiting and diarrhea.  She has also had chills but denies having any fever.  Patient has a history of diverticulitis and states that this pain feels similar to her last episode. She denies having any urinary symptoms, no dizziness, no lightheadedness, no headache, no cough, no shortness of breath or chest pain. Labs show sodium 132, potassium 3.9, chloride 100, bicarb 24, glucose 146, BUN 11, creatinine 0.85, calcium 8.8, alkaline phosphatase 81, albumin 3.6, lipase 23, AST 17, total protein 7.6, white count 17.4, hemoglobin 12.7, hematocrit 38.1, MCV 88, RDW 12.5, platelet count 353 Respiratory viral panel is negative Urine analysis shows pyuria CT scan of abdomen and pelvis shows acute diverticulitis of the proximal descending colon.  Robust inflammatory response surrounding the large inflamed diverticulum.  No perforation or abscess.   ED Course: Patient is a 49 year old Caucasian female who presents for evaluation of left lower quadrant pain associated with nausea, vomiting and diarrhea. Imaging is consistent with acute diverticulitis.  Patient received a dose of Zosyn in the ER and will be admitted to the hospital for further evaluation.   Review of Systems: As per HPI otherwise 10 point review of systems negative.    Past Medical History:  Diagnosis Date  . Allergy    . Bertolotti's syndrome   . Complication of anesthesia   . Fatty liver   . Fibroid   . GERD (gastroesophageal reflux disease)    OCC  . Hypertension   . Ovarian cyst   . PONV (postoperative nausea and vomiting)    NAUSEATED    Past Surgical History:  Procedure Laterality Date  . CERVICAL POLYPECTOMY    . CHOLECYSTECTOMY    . HYSTEROSCOPY WITH D & C N/A 03/28/2019   Procedure: DILATATION AND CURETTAGE /HYSTEROSCOPY;  Surgeon: Harlin Heys, MD;  Location: [facility] ORS;  Service: Gynecology;  Laterality: N/A;  . INTRAUTERINE DEVICE (IUD) INSERTION N/A 03/28/2019   Procedure: INTRAUTERINE DEVICE (IUD) INSERTION;  Surgeon: Harlin Heys, MD;  Location: [facility] ORS;  Service: Gynecology;  Laterality: N/A;  . NASAL SINUS SURGERY  X2  . SPINE SURGERY  ruptured disc   LUMBAR  . TONSILLECTOMY AND ADENOIDECTOMY  1985     reports that she has never smoked. She has never used smokeless tobacco. She reports that she does not drink alcohol and does not use drugs.  Allergies  Allergen Reactions  . Sulfa Antibiotics Hives  . Sulfur Hives    Family History  Problem Relation Age of Onset  . Hypertension Father   . Hypertension Brother   . Hypertension Paternal Grandmother   . Hypertension Brother   . Heart disease Mother      Prior to Admission medications   Medication Sig Start Date End Date Taking? Authorizing Provider  amLODipine (NORVASC) 5 MG tablet Take 1 tablet (5 mg total) by mouth daily. 06/28/20  Yes Eulogio Bear, NP  atenolol (TENORMIN) 25 MG tablet Take 1 tablet (25 mg total) by mouth daily. 06/28/20  Yes Eulogio Bear, NP  fluticasone (FLONASE) 50 MCG/ACT nasal spray Place 2 sprays into both nostrils every morning.   Yes [provider]  levonorgestrel (MIRENA) 20 MCG/24HR IUD 1 each by Intrauterine route once.   Yes [provider]  olmesartan (BENICAR) 40 MG tablet Take 1 tablet (40 mg total) by mouth daily. 06/28/20  Yes Eulogio Bear, NP    Physical Exam: Vitals:   09/09/20 0432 09/09/20 1049 09/09/20 1141  BP: (!) 152/70 (!) 125/58 122/65  Pulse: 88 75 81  Resp: 18 18 14   Temp: 99.3 F (37.4 C)    TempSrc: Oral    SpO2: 97% 99% 98%  Weight: 104.3 kg    Height: 5\' 2"  (1.575 m)       Vitals:   09/09/20 0432 09/09/20 1049 09/09/20 1141  BP: (!) 152/70 (!) 125/58 122/65  Pulse: 88 75 81  Resp: 18 18 14   Temp: 99.3 F (37.4 C)    TempSrc: Oral    SpO2: 97% 99% 98%  Weight: 104.3 kg    Height: 5\' 2"  (1.575 m)      Constitutional: NAD, alert and oriented x 3.  Appears uncomfortable and in painful distress Eyes: PERRL, lids and conjunctivae normal ENMT: Mucous membranes are dry.  Neck: normal, supple, no masses, no thyromegaly Respiratory: clear to auscultation bilaterally, no wheezing, no crackles. Normal respiratory effort. No accessory muscle use.  Cardiovascular: Regular rate and rhythm, no murmurs / rubs / gallops. No extremity edema. 2+ pedal pulses. No carotid bruits.  Abdomen: tenderness in the left lower quadrant, no masses palpated. No hepatosplenomegaly. Bowel sounds positive.  Musculoskeletal: no clubbing / cyanosis. No joint deformity upper and lower extremities.  Skin: no rashes, lesions, ulcers.  Neurologic: No gross focal neurologic deficit. Psychiatric: Normal mood and affect.   Labs on Admission: I have personally reviewed following labs and imaging studies  CBC: Recent Labs  Lab 09/09/20 0436  WBC 17.4*  HGB 12.7  HCT 38.1  MCV 88.8  PLT 779   Basic Metabolic Panel: Recent Labs  Lab 09/09/20 0436  NA 132*  K 3.9  CL 100  CO2 24  GLUCOSE 146*  BUN 11  CREATININE 0.85  CALCIUM 8.8*   GFR: Estimated Creatinine Clearance: 88.8 mL/min (by C-G formula based on SCr of 0.85 mg/dL). Liver Function Tests: Recent Labs  Lab 09/09/20 0436  AST 17  ALT 17  ALKPHOS 81  BILITOT 1.0  PROT 7.6  ALBUMIN 3.6   Recent Labs  Lab 09/09/20 0436  LIPASE 23   No results  for input(s): AMMONIA in the last 168 hours. Coagulation Profile: No results for input(s): INR, PROTIME in the last 168 hours. Cardiac Enzymes: No results for input(s): CKTOTAL, CKMB, CKMBINDEX, TROPONINI in the last 168 hours. BNP (last 3 results) No results for input(s): PROBNP in the last 8760 hours. HbA1C: No results for input(s): HGBA1C in the last 72 hours. CBG: No results for input(s): GLUCAP in the last 168 hours. Lipid Profile: No results for input(s): CHOL, HDL, LDLCALC, TRIG, CHOLHDL, LDLDIRECT in the last 72 hours. Thyroid Function Tests: No results for input(s): TSH, T4TOTAL, FREET4, T3FREE, THYROIDAB in the last 72 hours. Anemia Panel: No results for input(s): VITAMINB12, FOLATE, FERRITIN, TIBC, IRON, RETICCTPCT in the last 72 hours. Urine analysis:    Component Value Date/Time   COLORURINE YELLOW (A) 09/09/2020 3903  APPEARANCEUR CLOUDY (A) 09/09/2020 0436   APPEARANCEUR Clear 11/24/2019 0903   LABSPEC 1.020 09/09/2020 0436   LABSPEC 1.021 03/09/2013 1702   PHURINE 5.0 09/09/2020 0436   GLUCOSEU NEGATIVE 09/09/2020 0436   GLUCOSEU Negative 03/09/2013 1702   HGBUR MODERATE (A) 09/09/2020 0436   BILIRUBINUR NEGATIVE 09/09/2020 0436   BILIRUBINUR Negative 11/24/2019 0903   BILIRUBINUR Negative 03/09/2013 1702   KETONESUR NEGATIVE 09/09/2020 0436   PROTEINUR 30 (A) 09/09/2020 0436   NITRITE NEGATIVE 09/09/2020 0436   LEUKOCYTESUR MODERATE (A) 09/09/2020 0436   LEUKOCYTESUR 1+ 03/09/2013 1702    Radiological Exams on Admission: CT Abdomen Pelvis W Contrast  Result Date: 09/09/2020 CLINICAL DATA:  LLQ pain X 2 days, hx diverticulitis EXAM: CT ABDOMEN AND PELVIS WITH CONTRAST TECHNIQUE: Multidetector CT imaging of the abdomen and pelvis was performed using the standard protocol following bolus administration of intravenous contrast. CONTRAST:  119mL OMNIPAQUE IOHEXOL 300 MG/ML  SOLN COMPARISON:  03/08/2013 FINDINGS: Lower chest: Lung bases are clear. Hepatobiliary:  No focal hepatic lesion. Postcholecystectomy. No biliary dilatation. Pancreas: Pancreas is normal. No ductal dilatation. No pancreatic inflammation. Spleen: Normal spleen Adrenals/urinary tract: Adrenal glands and kidneys are normal. The ureters and bladder normal. Stomach/Bowel: Stomach, small bowel, appendix, and cecum are normal. Several diverticula of the ascending colon. Transverse colon normal. There is a large inflamed diverticulum extending along the anti mesenteric border of the proximal descending colon. The diverticulum measures 12 mm on image 40/3. There is fluid within the RIGHT pericolic gutter at this level and inflammation within the adjacent pericolonic fat (images 35 through 47 series 3). No perforation or abscess formation. The more distal descending colon is normal. There are several scattered diverticula of the sigmoid colon rectum normal. Vascular/Lymphatic: Abdominal aorta is normal caliber with atherosclerotic calcification. There is no retroperitoneal or periportal lymphadenopathy. No pelvic lymphadenopathy. Reproductive: IUD in expected location in the uterus. No adnexal abnormality Other: No free fluid. Musculoskeletal: No aggressive osseous lesion. IMPRESSION: 1. Acute diverticulitis of the proximal descending colon. Robust inflammatory response surrounding a large inflamed diverticulum. No perforation or abscess. 2. Scattered additional diverticula throughout the colon. 3. Aortic Atherosclerosis (ICD10-I70.0). Electronically Signed   By: Suzy Bouchard M.D.   On: 09/09/2020 11:32    EKG: Independently reviewed.   Assessment/Plan Principal Problem:   Acute diverticulitis Active Problems:   HTN (hypertension)   Obesity   Acute lower UTI    Acute diverticulitis Patient presents for evaluation of left lower quadrant pain associated with nausea, vomiting and diarrhea She has marked leukocytosis and imaging consistent with acute diverticulitis Keep patient n.p.o. except for  meds Morphine for pain control Start patient on IV Zosyn adjusted to renal function Supportive care with IV PPI and antiemetics   Hypertension Continue atenolol, amlodipine and ibesartan   Obesity (BMI 42) Complicates overall prognosis and care    UTI Continue empiric antibiotic therapy with Zosyn until urine culture results become available.    DVT prophylaxis: Lovenox Code Status: Full code Family Communication: Greater than 50% of time was spent discussing patient's condition and plan of care with her at the bedside.  All questions and concerns have been addressed.  She verbalizes understanding and agrees with the plan. Disposition Plan: Back to previous home environment Consults called: None    Victoria Bluestone MD [facility]     09/09/2020, 1:42 PM

## 2020-09-09 NOTE — Progress Notes (Signed)
Pharmacy Antibiotic Note  Victoria Bowers is a 49 y.o. female admitted on 09/09/2020 with IAI/acute diverticulitis.  Pharmacy has been consulted for Zosyn dosing.  Plan: Zosyn 3.375g IV q8h (4 hour infusion).  Height: 5\' 2"  (157.5 cm) Weight: 104.3 kg (230 lb) IBW/kg (Calculated) : 50.1  Temp (24hrs), Avg:99.3 F (37.4 C), Min:99.3 F (37.4 C), Max:99.3 F (37.4 C)  Recent Labs  Lab 09/09/20 0436  WBC 17.4*  CREATININE 0.85    Estimated Creatinine Clearance: 88.8 mL/min (by C-G formula based on SCr of 0.85 mg/dL).    Allergies  Allergen Reactions   Sulfa Antibiotics Hives   Sulfur Hives    Antimicrobials this admission: Zosyn 12/12 >>    Dose adjustments this admission:   Microbiology results:   Thank you for allowing pharmacy to be a part of this patients care.  Rocky Morel 09/09/2020 1:53 PM

## 2020-09-09 NOTE — ED Provider Notes (Signed)
[facility] Emergency Department Provider Note   ____________________________________________   Event Date/Time   First MD Initiated Contact with Patient 09/09/20 0543     (approximate)  I have reviewed the triage vital signs and the nursing notes.   HISTORY  Chief Complaint Abdominal Pain    HPI Victoria Bowers is a 49 y.o. female who presents to the ED from home with a chief complaint of abdominal pain. Patient reports LLQ abdominal pain x 2 days with associated chills and nausea. Denies fever, cough, chest pain, shortness of breath, vomiting, dysuria.  Thought she was constipated so took a laxative and now having loose stools.  History of diverticulitis 15 years ago states this feels similarly.     Past Medical History:  Diagnosis Date  . Allergy   . Bertolotti's syndrome   . Complication of anesthesia   . Fatty liver   . Fibroid   . GERD (gastroesophageal reflux disease)    OCC  . Hypertension   . Ovarian cyst   . PONV (postoperative nausea and vomiting)    NAUSEATED    Patient Active Problem List   Diagnosis Date Noted  . Leukocytosis   . Obesity, Class III, BMI 40-49.9 (morbid obesity) ([place])   . Acute diverticulitis 09/09/2020  . Acute lower UTI 09/09/2020  . Disorder of skin of trunk 03/18/2018  . Displacement of lumbar intervertebral disc without myelopathy 03/18/2018  . Inflammation of sacroiliac joint ([place]) 03/18/2018  . Low back pain 03/18/2018  . Vitamin D deficiency 05/15/2017  . DUB (dysfunctional uterine bleeding) 03/21/2016  . Left ovarian cyst 03/19/2016  . Abnormal bleeding in menstrual cycle 03/10/2016  . Left elbow tendonitis 11/28/2015  . Fatty liver 07/16/2015  . Migraine 07/16/2015  . Essential hypertension 03/09/2015  . Obesity 03/09/2015    Past Surgical History:  Procedure Laterality Date  . CERVICAL POLYPECTOMY    . CHOLECYSTECTOMY    . HYSTEROSCOPY WITH D & C N/A 03/28/2019   Procedure: DILATATION  AND CURETTAGE /HYSTEROSCOPY;  Surgeon: Harlin Heys, MD;  Location: [facility] ORS;  Service: Gynecology;  Laterality: N/A;  . INTRAUTERINE DEVICE (IUD) INSERTION N/A 03/28/2019   Procedure: INTRAUTERINE DEVICE (IUD) INSERTION;  Surgeon: Harlin Heys, MD;  Location: [facility] ORS;  Service: Gynecology;  Laterality: N/A;  . NASAL SINUS SURGERY  X2  . SPINE SURGERY  ruptured disc   LUMBAR  . TONSILLECTOMY AND ADENOIDECTOMY  1985    Prior to Admission medications   Medication Sig Start Date End Date Taking? Authorizing Provider  amLODipine (NORVASC) 5 MG tablet Take 1 tablet (5 mg total) by mouth daily. 06/28/20  Yes Eulogio Bear, NP  fluticasone (FLONASE) 50 MCG/ACT nasal spray Place 2 sprays into both nostrils every morning.   Yes [provider]  levonorgestrel (MIRENA) 20 MCG/24HR IUD 1 each by Intrauterine route once.   Yes [provider]  amoxicillin-clavulanate (AUGMENTIN) 875-125 MG tablet Take 1 tablet by mouth 2 (two) times daily for 8 days. 09/11/20 09/19/20  Loletha Grayer, MD    Allergies Sulfa antibiotics and Sulfur  Family History  Problem Relation Age of Onset  . Hypertension Father   . Hypertension Brother   . Hypertension Paternal Grandmother   . Hypertension Brother   . Heart disease Mother     Social History Social History   Tobacco Use  . Smoking status: Never Smoker  . Smokeless tobacco: Never Used  Vaping Use  . Vaping Use: Never used  Substance  Use Topics  . Alcohol use: No    Alcohol/week: 0.0 standard drinks  . Drug use: No    Review of Systems  Constitutional: No fever/chills Eyes: No visual changes. ENT: No sore throat. Cardiovascular: Denies chest pain. Respiratory: Denies shortness of breath. Gastrointestinal: Positive for left lower quadrant abdominal pain and nausea, no vomiting.  No diarrhea.  No constipation. Genitourinary: Negative for dysuria. Musculoskeletal: Negative for back pain. Skin: Negative for  rash. Neurological: Negative for headaches, focal weakness or numbness.   ____________________________________________   PHYSICAL EXAM:  VITAL SIGNS: ED Triage Vitals [09/09/20 0432]  Enc Vitals Group     BP (!) 152/70     Pulse Rate 88     Resp 18     Temp 99.3 F (37.4 C)     Temp Source Oral     SpO2 97 %     Weight 230 lb (104.3 kg)     Height 5\' 2"  (1.575 m)     Head Circumference      Peak Flow      Pain Score 4     Pain Loc      Pain Edu?      Excl. in [place]?     Constitutional: Alert and oriented. Well appearing and in mild acute distress. Eyes: Conjunctivae are normal. PERRL. EOMI. Head: Atraumatic. Nose: No congestion/rhinnorhea. Mouth/Throat: Mucous membranes are moist.   Neck: No stridor.   Cardiovascular: Normal rate, regular rhythm. Grossly normal heart sounds.  Good peripheral circulation. Respiratory: Normal respiratory effort.  No retractions. Lungs CTAB. Gastrointestinal: Soft and mildly tender left lower quadrant without rebound or guarding. No distention. No abdominal bruits. No CVA tenderness. Musculoskeletal: No lower extremity tenderness nor edema.  No joint effusions. Neurologic:  Normal speech and language. No gross focal neurologic deficits are appreciated. No gait instability. Skin:  Skin is warm, dry and intact. No rash noted.  No vesicles. Psychiatric: Mood and affect are normal. Speech and behavior are normal.  ____________________________________________   LABS (all labs ordered are listed, but only abnormal results are displayed)  Labs Reviewed  COMPREHENSIVE METABOLIC PANEL - Abnormal; Notable for the following components:      Result Value   Sodium 132 (*)    Glucose, Bld 146 (*)    Calcium 8.8 (*)    All other components within normal limits  CBC - Abnormal; Notable for the following components:   WBC 17.4 (*)    All other components within normal limits  URINALYSIS, COMPLETE (UACMP) WITH MICROSCOPIC - Abnormal; Notable for the  following components:   Color, Urine YELLOW (*)    APPearance CLOUDY (*)    Hgb urine dipstick MODERATE (*)    Protein, ur 30 (*)    Leukocytes,Ua MODERATE (*)    Bacteria, UA RARE (*)    All other components within normal limits  BASIC METABOLIC PANEL - Abnormal; Notable for the following components:   Glucose, Bld 125 (*)    Creatinine, Ser 1.05 (*)    Calcium 8.5 (*)    All other components within normal limits  CBC - Abnormal; Notable for the following components:   WBC 13.2 (*)    RBC 3.77 (*)    Hemoglobin 11.4 (*)    HCT 33.6 (*)    All other components within normal limits  CBC - Abnormal; Notable for the following components:   Hemoglobin 11.9 (*)    HCT 35.2 (*)    All other components within normal limits  BASIC METABOLIC PANEL - Abnormal; Notable for the following components:   Glucose, Bld 113 (*)    Calcium 8.5 (*)    All other components within normal limits  RESP PANEL BY RT-PCR (FLU A&B, COVID) ARPGX2  LIPASE, BLOOD  HIV ANTIBODY (ROUTINE TESTING W REFLEX)  PREGNANCY, URINE   ____________________________________________  EKG  None ____________________________________________  RADIOLOGY I, Advika Mclelland J, personally viewed and evaluated these images (plain radiographs) as part of my medical decision making, as well as reviewing the written report by the radiologist.  ED MD interpretation: Pending  Official radiology report(s): No results found.  ____________________________________________   PROCEDURES  Procedure(s) performed (including Critical Care):  Procedures   ____________________________________________   INITIAL IMPRESSION / ASSESSMENT AND PLAN / ED COURSE  As part of my medical decision making, I reviewed the following data within the [place] History obtained from family, Nursing notes reviewed and incorporated, Labs reviewed, Old chart reviewed, Notes from prior ED visits and [place] Controlled Substance Database      50 year old female presenting with left lower quadrant pain and nausea, history of diverticulitis. Differential diagnosis includes, but is not limited to, ovarian cyst, ovarian torsion, acute appendicitis, diverticulitis, urinary tract infection/pyelonephritis, endometriosis, bowel obstruction, colitis, renal colic, gastroenteritis, hernia, fibroids, endometriosis, etc.  Laboratory results significant for leukocytosis, moderate leukocytes in UA.  Will initiate IV fluid hydration, IV morphine for pain, IV Zofran for nausea.  Start IV Zosyn for UTI/possible intra-abdominal infection.  Will proceed with CT abdomen/pelvis to evaluate for diverticulitis.  Clinical Course as of 09/15/20 0327  Sun Sep 09, 2020  0707 Patient working on oral contrast.  Will repeat IV morphine for pain.  Care transferred to Dr. Jacqualine Code at change of shift pending CT scan results.  If CT scan unremarkable for surgical intervention, anticipate discharge home with antibiotics, analgesia, antiemetics. [JS]    Clinical Course User Index [JS] Paulette Blanch, MD     ____________________________________________   FINAL CLINICAL IMPRESSION(S) / ED DIAGNOSES  Final diagnoses:  Left lower quadrant abdominal pain  Lower urinary tract infectious disease  Diverticulitis  Intractable nausea and vomiting     ED Discharge Orders         Ordered    amoxicillin-clavulanate (AUGMENTIN) 875-125 MG tablet  2 times daily        09/11/20 2671          *Please note:  Mariha Sleeper Ringle was evaluated in Emergency Department on 09/15/2020 for the symptoms described in the history of present illness. She was evaluated in the context of the global COVID-19 pandemic, which necessitated consideration that the patient might be at risk for infection with the SARS-CoV-2 virus that causes COVID-19. Institutional protocols and algorithms that pertain to the evaluation of patients at risk for COVID-19 are in a state of rapid change based on  information released by regulatory bodies including the CDC and federal and state organizations. These policies and algorithms were followed during the patient's care in the ED.  Some ED evaluations and interventions may be delayed as a result of limited staffing during and the pandemic.*   Note:  This document was prepared using Dragon voice recognition software and may include unintentional dictation errors.   Paulette Blanch, MD 09/15/20 [phone]

## 2020-09-10 DIAGNOSIS — D72829 Elevated white blood cell count, unspecified: Secondary | ICD-10-CM

## 2020-09-10 LAB — CBC
HCT: 33.6 % — ABNORMAL LOW (ref 36.0–46.0)
Hemoglobin: 11.4 g/dL — ABNORMAL LOW (ref 12.0–15.0)
MCH: 30.2 pg (ref 26.0–34.0)
MCHC: 33.9 g/dL (ref 30.0–36.0)
MCV: 89.1 fL (ref 80.0–100.0)
Platelets: 336 10*3/uL (ref 150–400)
RBC: 3.77 MIL/uL — ABNORMAL LOW (ref 3.87–5.11)
RDW: 12.7 % (ref 11.5–15.5)
WBC: 13.2 10*3/uL — ABNORMAL HIGH (ref 4.0–10.5)
nRBC: 0 % (ref 0.0–0.2)

## 2020-09-10 LAB — BASIC METABOLIC PANEL
Anion gap: 8 (ref 5–15)
BUN: 12 mg/dL (ref 6–20)
CO2: 26 mmol/L (ref 22–32)
Calcium: 8.5 mg/dL — ABNORMAL LOW (ref 8.9–10.3)
Chloride: 102 mmol/L (ref 98–111)
Creatinine, Ser: 1.05 mg/dL — ABNORMAL HIGH (ref 0.44–1.00)
GFR, Estimated: >60 mL/min (ref >60)
Glucose, Bld: 125 mg/dL — ABNORMAL HIGH (ref 70–99)
Potassium: 3.9 mmol/L (ref 3.5–5.1)
Sodium: 136 mmol/L (ref 135–145)

## 2020-09-10 LAB — HIV ANTIBODY (ROUTINE TESTING W REFLEX): HIV Screen 4th Generation wRfx: NONREACTIVE

## 2020-09-10 MED ORDER — SODIUM CHLORIDE 0.9 % IV SOLN: INTRAVENOUS | Status: DC

## 2020-09-10 MED ORDER — SODIUM CHLORIDE 0.9 % IV SOLN: INTRAVENOUS | Status: DC | PRN

## 2020-09-10 NOTE — Progress Notes (Signed)
Patient ID: Victoria Bowers, female   DOB: May 15, 1971, 49 y.o.   MRN: [id number] [facility] PROGRESS NOTE  Victoria Bowers WSF:[id number] DOB: 09-25-1971 DOA: 09/09/2020 PCP: Volney American, PA-C  HPI/Subjective: Patient feeling better than yesterday.  Patient had some nausea vomiting but that has resolved.  Came in with abdominal pain and diagnosed with acute diverticulitis.  Objective: Vitals:   09/10/20 0808 09/10/20 1354  BP: (!) 117/55 135/69  Pulse: 60 63  Resp: 12 18  Temp: 98.2 F (36.8 C) 98.5 F (36.9 C)  SpO2: 97% 100%    Intake/Output Summary (Last 24 hours) at 09/10/2020 1420 Last data filed at 09/10/2020 1414 Gross per 24 hour  Intake 1710.68 ml  Output 200 ml  Net 1510.68 ml   Filed Weights   09/09/20 0432  Weight: 104.3 kg    ROS: Review of Systems  Respiratory: Negative for shortness of breath.   Cardiovascular: Negative for chest pain.  Gastrointestinal: Positive for abdominal pain. Negative for nausea and vomiting.   Exam: Physical Exam HENT:     Head: Normocephalic.     Mouth/Throat:     Pharynx: No oropharyngeal exudate.  Eyes:     General: Lids are normal.     Conjunctiva/sclera: Conjunctivae normal.     Pupils: Pupils are equal, round, and reactive to light.  Cardiovascular:     Rate and Rhythm: Normal rate and regular rhythm.     Heart sounds: Normal heart sounds, S1 normal and S2 normal.  Pulmonary:     Breath sounds: No decreased breath sounds, wheezing, rhonchi or rales.  Abdominal:     Palpations: Abdomen is soft.     Tenderness: There is abdominal tenderness in the left lower quadrant.  Musculoskeletal:     Right lower leg: No edema.     Left lower leg: No edema.  Skin:    General: Skin is warm.     Findings: No rash.  Neurological:     Mental Status: She is alert and oriented to person, place, and time.       Data Reviewed: Basic Metabolic Panel: Recent Labs  Lab 09/09/20 0436 09/10/20 0419  NA  132* 136  K 3.9 3.9  CL 100 102  CO2 24 26  GLUCOSE 146* 125*  BUN 11 12  CREATININE 0.85 1.05*  CALCIUM 8.8* 8.5*   Liver Function Tests: Recent Labs  Lab 09/09/20 0436  AST 17  ALT 17  ALKPHOS 81  BILITOT 1.0  PROT 7.6  ALBUMIN 3.6   Recent Labs  Lab 09/09/20 0436  LIPASE 23   CBC: Recent Labs  Lab 09/09/20 0436 09/10/20 0419  WBC 17.4* 13.2*  HGB 12.7 11.4*  HCT 38.1 33.6*  MCV 88.8 89.1  PLT 353 336    Recent Results (from the past 240 hour(s))  Resp Panel by RT-PCR (Flu A&B, Covid) Nasopharyngeal Swab     Status: None   Collection Time: 09/09/20 11:48 AM   Specimen: Nasopharyngeal Swab; Nasopharyngeal(NP) swabs in vial transport medium  Result Value Ref Range Status   SARS Coronavirus 2 by RT PCR NEGATIVE NEGATIVE Final    Comment: (NOTE) SARS-CoV-2 target nucleic acids are NOT DETECTED.  The SARS-CoV-2 RNA is generally detectable in upper respiratory specimens during the acute phase of infection. The lowest concentration of SARS-CoV-2 viral copies this assay can detect is 138 copies/mL. A negative result does not preclude SARS-Cov-2 infection and should not be used as the sole basis for treatment or other  patient management decisions. A negative result may occur with  improper specimen collection/handling, submission of specimen other than nasopharyngeal swab, presence of viral mutation(s) within the areas targeted by this assay, and inadequate number of viral copies(<138 copies/mL). A negative result must be combined with clinical observations, patient history, and epidemiological information. The expected result is Negative.  Fact Sheet for Patients:  EntrepreneurPulse.com.au  Fact Sheet for Healthcare Providers:  IncredibleEmployment.be  This test is no t yet approved or cleared by the [place] FDA and  has been authorized for detection and/or diagnosis of SARS-CoV-2 by FDA under an Emergency Use  Authorization (EUA). This EUA will remain  in effect (meaning this test can be used) for the duration of the COVID-19 declaration under Section 564(b)(1) of the Act, 21 U.S.C.section 360bbb-3(b)(1), unless the authorization is terminated  or revoked sooner.       Influenza A by PCR NEGATIVE NEGATIVE Final   Influenza B by PCR NEGATIVE NEGATIVE Final    Comment: (NOTE) The Xpert Xpress SARS-CoV-2/FLU/RSV plus assay is intended as an aid in the diagnosis of influenza from Nasopharyngeal swab specimens and should not be used as a sole basis for treatment. Nasal washings and aspirates are unacceptable for Xpert Xpress SARS-CoV-2/FLU/RSV testing.  Fact Sheet for Patients: EntrepreneurPulse.com.au  Fact Sheet for Healthcare Providers: IncredibleEmployment.be  This test is not yet approved or cleared by the [place] FDA and has been authorized for detection and/or diagnosis of SARS-CoV-2 by FDA under an Emergency Use Authorization (EUA). This EUA will remain in effect (meaning this test can be used) for the duration of the COVID-19 declaration under Section 564(b)(1) of the Act, 21 U.S.C. section 360bbb-3(b)(1), unless the authorization is terminated or revoked.  Performed at [facility], [address] [place], [place] [address]      Studies: CT Abdomen Pelvis W Contrast  Result Date: 09/09/2020 CLINICAL DATA:  LLQ pain X 2 days, hx diverticulitis EXAM: CT ABDOMEN AND PELVIS WITH CONTRAST TECHNIQUE: Multidetector CT imaging of the abdomen and pelvis was performed using the standard protocol following bolus administration of intravenous contrast. CONTRAST:  163mL OMNIPAQUE IOHEXOL 300 MG/ML  SOLN COMPARISON:  03/08/2013 FINDINGS: Lower chest: Lung bases are clear. Hepatobiliary: No focal hepatic lesion. Postcholecystectomy. No biliary dilatation. Pancreas: Pancreas is normal. No ductal dilatation. No pancreatic inflammation. Spleen:  Normal spleen Adrenals/urinary tract: Adrenal glands and kidneys are normal. The ureters and bladder normal. Stomach/Bowel: Stomach, small bowel, appendix, and cecum are normal. Several diverticula of the ascending colon. Transverse colon normal. There is a large inflamed diverticulum extending along the anti mesenteric border of the proximal descending colon. The diverticulum measures 12 mm on image 40/3. There is fluid within the RIGHT pericolic gutter at this level and inflammation within the adjacent pericolonic fat (images 35 through 47 series 3). No perforation or abscess formation. The more distal descending colon is normal. There are several scattered diverticula of the sigmoid colon rectum normal. Vascular/Lymphatic: Abdominal aorta is normal caliber with atherosclerotic calcification. There is no retroperitoneal or periportal lymphadenopathy. No pelvic lymphadenopathy. Reproductive: IUD in expected location in the uterus. No adnexal abnormality Other: No free fluid. Musculoskeletal: No aggressive osseous lesion. IMPRESSION: 1. Acute diverticulitis of the proximal descending colon. Robust inflammatory response surrounding a large inflamed diverticulum. No perforation or abscess. 2. Scattered additional diverticula throughout the colon. 3. Aortic Atherosclerosis (ICD10-I70.0). Electronically Signed   By: Suzy Bouchard M.D.   On: 09/09/2020 11:32    Scheduled Meds: . amLODipine  5  mg Oral Daily  . enoxaparin (LOVENOX) injection  52.5 mg Subcutaneous Q24H  . fluticasone  2 spray Each Nare [id number]  . pantoprazole (PROTONIX) IV  40 mg Intravenous Q24H   Continuous Infusions: . sodium chloride 100 mL/hr at 09/10/20 0828  . sodium chloride    . piperacillin-tazobactam (ZOSYN)  IV 12.5 mL/hr at 09/10/20 0608    Assessment/Plan:  1. Acute diverticulitis in the proximal descending colon with robust inflammatory response surrounding a large inflamed diverticulum.  No perforation or abscess.  Patient  on empiric IV Zosyn and IV fluids.  As needed pain and nausea medications.  Advance diet to clear liquid diet this morning and full liquid diet for dinner.  Reassess tomorrow. 2. Leukocytosis secondary to diverticulitis.  White blood cell count came down from 17.4 down to 13.2.  Continue to monitor. 3. Morbid obesity with a BMI of 42.07  4. Essential hypertension.  Blood pressure on the lower side today hold antihypertensive medications. 5. Acute UTI.  More symptoms with respect to diverticulitis and UTI but follow-up urine culture.  Zosyn would cover     Code Status:     Code Status Orders  (From admission, onward)         Start     Ordered   09/09/20 1332  Full code  Continuous        09/09/20 1333        Code Status History    Date Active Date Inactive Code Status Order ID Comments User Context   03/28/2019 1228 03/28/2019 1720 Full Code [id number]  Harlin Heys, MD Inpatient   Advance Care Planning Activity     Family Communication: Left message for husband on the phone Disposition Plan: Status is: Inpatient  Dispo: The patient is from: Home              Anticipated d/c is to: Home              Anticipated d/c date is: Earliest potential will be 09/11/2020 versus 09/12/2020 depending on clinical course.              Patient currently patient being treated with IV antibiotics for acute diverticulitis with robust inflammatory response (as per CAT scan).  Time spent: 28 minutes  [place]

## 2020-09-11 LAB — CBC
HCT: 35.2 % — ABNORMAL LOW (ref 36.0–46.0)
Hemoglobin: 11.9 g/dL — ABNORMAL LOW (ref 12.0–15.0)
MCH: 30 pg (ref 26.0–34.0)
MCHC: 33.8 g/dL (ref 30.0–36.0)
MCV: 88.7 fL (ref 80.0–100.0)
Platelets: 315 10*3/uL (ref 150–400)
RBC: 3.97 MIL/uL (ref 3.87–5.11)
RDW: 12.3 % (ref 11.5–15.5)
WBC: 7.9 10*3/uL (ref 4.0–10.5)
nRBC: 0 % (ref 0.0–0.2)

## 2020-09-11 LAB — BASIC METABOLIC PANEL
Anion gap: 10 (ref 5–15)
BUN: 10 mg/dL (ref 6–20)
CO2: 22 mmol/L (ref 22–32)
Calcium: 8.5 mg/dL — ABNORMAL LOW (ref 8.9–10.3)
Chloride: 103 mmol/L (ref 98–111)
Creatinine, Ser: 0.96 mg/dL (ref 0.44–1.00)
GFR, Estimated: >60 mL/min (ref >60)
Glucose, Bld: 113 mg/dL — ABNORMAL HIGH (ref 70–99)
Potassium: 4 mmol/L (ref 3.5–5.1)
Sodium: 135 mmol/L (ref 135–145)

## 2020-09-11 MED ORDER — AMOXICILLIN-POT CLAVULANATE 875-125 MG PO TABS: 1.0000 | ORAL_TABLET | Freq: Two times a day (BID) | ORAL | 0 refills | Status: AC

## 2020-09-11 NOTE — Discharge Instructions (Signed)

## 2020-09-11 NOTE — Discharge Summary (Signed)
[place] [facility] [place] NAME: Victoria Bowers    MR#:  [id number]  DATE OF BIRTH:  [birth year]  DATE OF ADMISSION:  09/09/2020 ADMITTING PHYSICIAN: Collier Bullock, MD  DATE OF DISCHARGE: 09/11/2020 11:38 AM  PRIMARY CARE PHYSICIAN: Volney American, PA-C    ADMISSION DIAGNOSIS:  Lower urinary tract infectious disease [N39.0] Diverticulitis [K57.92] Intractable nausea and vomiting [R11.2] Acute diverticulitis [K57.92] Left lower quadrant abdominal pain [R10.32]  DISCHARGE DIAGNOSIS:  Principal Problem:   Acute diverticulitis Active Problems:   Essential hypertension   Obesity   Acute lower UTI   Leukocytosis   Obesity, Class III, BMI 40-49.9 (morbid obesity) ([place])   SECONDARY DIAGNOSIS:   Past Medical History:  Diagnosis Date  . Allergy   . Bertolotti's syndrome   . Complication of anesthesia   . Fatty liver   . Fibroid   . GERD (gastroesophageal reflux disease)    OCC  . Hypertension   . Ovarian cyst   . PONV (postoperative nausea and vomiting)    NAUSEATED    HOSPITAL COURSE:  1.  Acute diverticulitis in the proximal descending colon with robust inflammatory response surrounding a large inflamed diverticulum.  No perforation or abscess.  The patient was placed on empiric IV Zosyn and IV fluids.  The patient initially had quite a bit of pain but over the past 2 days has not had any pain whatsoever.  Patient tolerated advance diet and was feeling better to go home.  Patient will be scribed Augmentin upon disposition. 2.  Leukocytosis.  White blood cell count came down from 17.4 on presentation to 7.9. 3.  Morbid obesity.  BMI of 42.07 4.  Essential hypertension.  Blood pressure still slightly on the lower side.  Can continue Norvasc but hold other blood pressure medications follow-up as outpatient. 5.  Acute UTI.  Unfortunately urine culture not sent from the ED.  Zosyn would cover most urinary infections anyway.  Not  having any urinary symptoms.   DISCHARGE CONDITIONS:   Satisfactory  CONSULTS OBTAINED:  None  DRUG ALLERGIES:   Allergies  Allergen Reactions  . Sulfa Antibiotics Hives  . Sulfur Hives    DISCHARGE MEDICATIONS:   Allergies as of 09/11/2020      Reactions   Sulfa Antibiotics Hives   Sulfur Hives      Medication List    STOP taking these medications   atenolol 25 MG tablet Commonly known as: TENORMIN   olmesartan 40 MG tablet Commonly known as: BENICAR     TAKE these medications   amLODipine 5 MG tablet Commonly known as: NORVASC Take 1 tablet (5 mg total) by mouth daily. Notes to patient: Morning 09/12/20   amoxicillin-clavulanate 875-125 MG tablet Commonly known as: Augmentin Take 1 tablet by mouth 2 (two) times daily for 8 days. Notes to patient: Before bed 09/11/20   fluticasone 50 MCG/ACT nasal spray Commonly known as: FLONASE Place 2 sprays into both nostrils every morning. Notes to patient: Morning 09/12/20   levonorgestrel 20 MCG/24HR IUD Commonly known as: MIRENA 1 each by Intrauterine route once.        DISCHARGE INSTRUCTIONS:   Follow-up PMD 5 days Refer to gastroenterology for colonoscopy as outpatient in a month or so  If you experience worsening of your admission symptoms, develop shortness of breath, life threatening emergency, suicidal or homicidal thoughts you must seek medical attention immediately by calling 911 or calling your MD immediately  if symptoms less severe.  You Must read complete instructions/literature along with all the possible adverse reactions/side effects for all the Medicines you take and that have been prescribed to you. Take any new Medicines after you have completely understood and accept all the possible adverse reactions/side effects.   Please note  You were cared for by a hospitalist during your hospital stay. If you have any questions about your discharge medications or the care you received while you  were in the hospital after you are discharged, you can call the unit and asked to speak with the hospitalist on call if the hospitalist that took care of you is not available. Once you are discharged, your primary care physician will handle any further medical issues. Please note that NO REFILLS for any discharge medications will be authorized once you are discharged, as it is imperative that you return to your primary care physician (or establish a relationship with a primary care physician if you do not have one) for your aftercare needs so that they can reassess your need for medications and monitor your lab values.    Today   CHIEF COMPLAINT:   Chief Complaint  Patient presents with  . Abdominal Pain    HISTORY OF PRESENT ILLNESS:  Victoria Bowers  is a 49 y.o. female came in with abdominal pain and found to have   VITAL SIGNS:  Blood pressure (!) 147/75, pulse 68, temperature (!) 97.4 F (36.3 C), temperature source Oral, resp. rate 20, height 5\' 2"  (1.575 m), weight 104.3 kg, SpO2 98 %.  I/O:    Intake/Output Summary (Last 24 hours) at 09/11/2020 1719 Last data filed at 09/11/2020 1101 Gross per 24 hour  Intake 995.49 ml  Output 1600 ml  Net -604.51 ml    PHYSICAL EXAMINATION:  GENERAL:  49 y.o.-year-old patient lying in the bed with no acute distress.  EYES: Pupils equal, round, reactive to light and accommodation. No scleral icterus.  HEENT: Head atraumatic, normocephalic. Oropharynx and nasopharynx clear.   LUNGS: Normal breath sounds bilaterally, no wheezing, rales,rhonchi or crepitation. No use of accessory muscles of respiration.  CARDIOVASCULAR: S1, S2 normal. No murmurs, rubs, or gallops.  ABDOMEN: Soft, non-tender, non-distended.  EXTREMITIES: No pedal edema.  NEUROLOGIC: Cranial nerves II through XII are intact. Muscle strength 5/5 in all extremities. Sensation intact. Gait not checked.  PSYCHIATRIC: The patient is alert and oriented x 3.  SKIN: No obvious  rash, lesion, or ulcer.   DATA REVIEW:   CBC Recent Labs  Lab 09/11/20 0707  WBC 7.9  HGB 11.9*  HCT 35.2*  PLT 315    Chemistries  Recent Labs  Lab 09/09/20 0436 09/10/20 0419 09/11/20 0707  NA 132*   < > 135  K 3.9   < > 4.0  CL 100   < > 103  CO2 24   < > 22  GLUCOSE 146*   < > 113*  BUN 11   < > 10  CREATININE 0.85   < > 0.96  CALCIUM 8.8*   < > 8.5*  AST 17  --   --   ALT 17  --   --   ALKPHOS 81  --   --   BILITOT 1.0  --   --    < > = values in this interval not displayed.   Microbiology Results  Results for orders placed or performed during the hospital encounter of 09/09/20  Resp Panel by RT-PCR (Flu A&B, Covid) Nasopharyngeal Swab     Status:  None   Collection Time: 09/09/20 11:48 AM   Specimen: Nasopharyngeal Swab; Nasopharyngeal(NP) swabs in vial transport medium  Result Value Ref Range Status   SARS Coronavirus 2 by RT PCR NEGATIVE NEGATIVE Final    Comment: (NOTE) SARS-CoV-2 target nucleic acids are NOT DETECTED.  The SARS-CoV-2 RNA is generally detectable in upper respiratory specimens during the acute phase of infection. The lowest concentration of SARS-CoV-2 viral copies this assay can detect is 138 copies/mL. A negative result does not preclude SARS-Cov-2 infection and should not be used as the sole basis for treatment or other patient management decisions. A negative result may occur with  improper specimen collection/handling, submission of specimen other than nasopharyngeal swab, presence of viral mutation(s) within the areas targeted by this assay, and inadequate number of viral copies(<138 copies/mL). A negative result must be combined with clinical observations, patient history, and epidemiological information. The expected result is Negative.  Fact Sheet for Patients:  EntrepreneurPulse.com.au  Fact Sheet for Healthcare Providers:  IncredibleEmployment.be  This test is no t yet approved or  cleared by the [place] FDA and  has been authorized for detection and/or diagnosis of SARS-CoV-2 by FDA under an Emergency Use Authorization (EUA). This EUA will remain  in effect (meaning this test can be used) for the duration of the COVID-19 declaration under Section 564(b)(1) of the Act, 21 U.S.C.section 360bbb-3(b)(1), unless the authorization is terminated  or revoked sooner.       Influenza A by PCR NEGATIVE NEGATIVE Final   Influenza B by PCR NEGATIVE NEGATIVE Final    Comment: (NOTE) The Xpert Xpress SARS-CoV-2/FLU/RSV plus assay is intended as an aid in the diagnosis of influenza from Nasopharyngeal swab specimens and should not be used as a sole basis for treatment. Nasal washings and aspirates are unacceptable for Xpert Xpress SARS-CoV-2/FLU/RSV testing.  Fact Sheet for Patients: EntrepreneurPulse.com.au  Fact Sheet for Healthcare Providers: IncredibleEmployment.be  This test is not yet approved or cleared by the [place] FDA and has been authorized for detection and/or diagnosis of SARS-CoV-2 by FDA under an Emergency Use Authorization (EUA). This EUA will remain in effect (meaning this test can be used) for the duration of the COVID-19 declaration under Section 564(b)(1) of the Act, 21 U.S.C. section 360bbb-3(b)(1), unless the authorization is terminated or revoked.  Performed at [facility], [address], [place], [place] [address]      Management plans discussed with the patient, and she is in agreement.  CODE STATUS:  Code Status History    Date Active Date Inactive Code Status Order ID Comments User Context   09/09/2020 1333 09/11/2020 1655 Full Code [id number]  Collier Bullock, MD ED   03/28/2019 1228 03/28/2019 1720 Full Code [id number]  Harlin Heys, MD Inpatient   Advance Care Planning Activity    Questions for Most Recent Historical Code Status (Order [id number])       TOTAL TIME  TAKING CARE OF THIS PATIENT: 34 minutes.    Loletha Grayer M.D on 09/11/2020 at 5:19 PM  Between 7am to 6pm - Pager - [phone]  After 6pm go to [email or web address] - password [email or web address]  [facility]  CC: Primary care physician; Volney American, PA-C

## 2020-09-18 ENCOUNTER — Ambulatory Visit (INDEPENDENT_AMBULATORY_CARE_PROVIDER_SITE_OTHER): Payer: 59 | Admitting: Family Medicine

## 2020-09-18 ENCOUNTER — Other Ambulatory Visit: Payer: Self-pay

## 2020-09-18 ENCOUNTER — Encounter: Payer: Self-pay | Admitting: Family Medicine

## 2020-09-18 VITALS — BP 113/68 | HR 62 | Temp 98.1°F | Wt 241.6 lb

## 2020-09-18 DIAGNOSIS — K5792 Diverticulitis of intestine, part unspecified, without perforation or abscess without bleeding: Secondary | ICD-10-CM | POA: Diagnosis not present

## 2020-09-18 DIAGNOSIS — D72829 Elevated white blood cell count, unspecified: Secondary | ICD-10-CM | POA: Diagnosis not present

## 2020-09-18 DIAGNOSIS — N390 Urinary tract infection, site not specified: Secondary | ICD-10-CM

## 2020-09-18 DIAGNOSIS — I10 Essential (primary) hypertension: Secondary | ICD-10-CM | POA: Diagnosis not present

## 2020-09-18 DIAGNOSIS — I700 Atherosclerosis of aorta: Secondary | ICD-10-CM

## 2020-09-18 LAB — URINALYSIS, ROUTINE W REFLEX MICROSCOPIC
Bilirubin, UA: NEGATIVE
Glucose, UA: NEGATIVE
Ketones, UA: NEGATIVE
Leukocytes,UA: NEGATIVE
Nitrite, UA: NEGATIVE
Protein,UA: NEGATIVE
Specific Gravity, UA: 1.025 (ref 1.005–1.030)
Urobilinogen, Ur: 0.2 mg/dL (ref 0.2–1.0)
pH, UA: 6 (ref 5.0–7.5)

## 2020-09-18 LAB — MICROSCOPIC EXAMINATION
Bacteria, UA: NONE SEEN
WBC, UA: NONE SEEN /hpf (ref 0–5)

## 2020-09-18 NOTE — Assessment & Plan Note (Signed)
Resolved. Checking labs today to confirm resolution of leukocytosis. Has 3 pills left on her augmentin.

## 2020-09-18 NOTE — Assessment & Plan Note (Signed)
Rechecking CBC today. Await results. Treat as needed.

## 2020-09-18 NOTE — Patient Instructions (Signed)
Diverticulitis  Diverticulitis is infection or inflammation of small pouches (diverticula) in the colon that form due to a condition called diverticulosis. Diverticula can trap stool (feces) and bacteria, causing infection and inflammation. Diverticulitis may cause severe stomach pain and diarrhea. It may lead to tissue damage in the colon that causes bleeding. The diverticula may also burst (rupture) and cause infected stool to enter other areas of the abdomen. Complications of diverticulitis can include:  Bleeding.  Severe infection.  Severe pain.  Rupture (perforation) of the colon.  Blockage (obstruction) of the colon. What are the causes? This condition is caused by stool becoming trapped in the diverticula, which allows bacteria to grow in the diverticula. This leads to inflammation and infection. What increases the risk? You are more likely to develop this condition if:  You have diverticulosis. The risk for diverticulosis increases if: ? You are overweight or obese. ? You use tobacco products. ? You do not get enough exercise.  You eat a diet that does not include enough fiber. High-fiber foods include fruits, vegetables, beans, nuts, and whole grains. What are the signs or symptoms? Symptoms of this condition may include:  Pain and tenderness in the abdomen. The pain is normally located on the left side of the abdomen, but it may occur in other areas.  Fever and chills.  Bloating.  Cramping.  Nausea.  Vomiting.  Changes in bowel routines.  Blood in your stool. How is this diagnosed? This condition is diagnosed based on:  Your medical history.  A physical exam.  Tests to make sure there is nothing else causing your condition. These tests may include: ? Blood tests. ? Urine tests. ? Imaging tests of the abdomen, including X-rays, ultrasounds, MRIs, or CT scans. How is this treated? Most cases of this condition are mild and can be treated at home.  Treatment may include:  Taking over-the-counter pain medicines.  Following a clear liquid diet.  Taking antibiotic medicines by mouth.  Rest. More severe cases may need to be treated at a hospital. Treatment may include:  Not eating or drinking.  Taking prescription pain medicine.  Receiving antibiotic medicines through an IV tube.  Receiving fluids and nutrition through an IV tube.  Surgery. When your condition is under control, your health care provider may recommend that you have a colonoscopy. This is an exam to look at the entire large intestine. During the exam, a lubricated, bendable tube is inserted into the anus and then passed into the rectum, colon, and other parts of the large intestine. A colonoscopy can show how severe your diverticula are and whether something else may be causing your symptoms. Follow these instructions at home: Medicines  Take over-the-counter and prescription medicines only as told by your health care provider. These include fiber supplements, probiotics, and stool softeners.  If you were prescribed an antibiotic medicine, take it as told by your health care provider. Do not stop taking the antibiotic even if you start to feel better.  Do not drive or use heavy machinery while taking prescription pain medicine. General instructions   Follow a full liquid diet or another diet as directed by your health care provider. After your symptoms improve, your health care provider may tell you to change your diet. He or she may recommend that you eat a diet that contains at least 25 g (25 grams) of fiber daily. Fiber makes it easier to pass stool. Healthy sources of fiber include: ? Berries. One cup contains 4-8 grams of   fiber. ? Beans or lentils. One half cup contains 5-8 grams of fiber. ? Green vegetables. One cup contains 4 grams of fiber.  Exercise for at least 30 minutes, 3 times each week. You should exercise hard enough to raise your heart rate and  break a sweat.  Keep all follow-up visits as told by your health care provider. This is important. You may need a colonoscopy. Contact a health care provider if:  Your pain does not improve.  You have a hard time drinking or eating food.  Your bowel movements do not return to normal. Get help right away if:  Your pain gets worse.  Your symptoms do not get better with treatment.  Your symptoms suddenly get worse.  You have a fever.  You vomit more than one time.  You have stools that are bloody, black, or tarry. Summary  Diverticulitis is infection or inflammation of small pouches (diverticula) in the colon that form due to a condition called diverticulosis. Diverticula can trap stool (feces) and bacteria, causing infection and inflammation.  You are at higher risk for this condition if you have diverticulosis and you eat a diet that does not include enough fiber.  Most cases of this condition are mild and can be treated at home. More severe cases may need to be treated at a hospital.  When your condition is under control, your health care provider may recommend that you have an exam called a colonoscopy. This exam can show how severe your diverticula are and whether something else may be causing your symptoms. This information is not intended to replace advice given to you by your health care provider. Make sure you discuss any questions you have with your health care provider. Document Revised: 08/28/2017 Document Reviewed: 10/18/2016 Elsevier Patient Education  2020 [employer]  

## 2020-09-18 NOTE — Assessment & Plan Note (Signed)
Running well on current regimen. She will call if she starts to get dizzy, otherwise we will leave her on current regimen. Recheck in March as scheduled.

## 2020-09-18 NOTE — Progress Notes (Signed)
BP 113/68   Pulse 62   Temp 98.1 F (36.7 C)   Wt 241 lb 9.6 oz (109.6 kg)   SpO2 98%   BMI 44.19 kg/m    Subjective:    Patient ID: Victoria Bowers, female    DOB: 11-09-1970, 49 y.o.   MRN: [id number]  HPI: Victoria Bowers is a 49 y.o. female  Chief Complaint  Patient presents with  . Hospitalization Follow-up    Pt was admitted on 12/12 for abdominal pain and nausea, was discharged on 12/14 diagnosed with UTI and diverticulitis    Transition of [facility] Follow up.   Hospital/Facility: [facility] D/C Physician: Dr. Leslye Peer D/C Date: 09/11/20  Records Requested: 09/18/20 Records Received: 09/18/20 Records Reviewed: 09/18/20  Diagnoses on Discharge: Acute diverticulitis, UTI  Date of interactive Contact within 48 hours of discharge: Not done  Date of 7 day or 14 day face-to-face visit:  09/18/20  within 7 days  Outpatient Encounter Medications as of 09/18/2020  Medication Sig Note  . amLODipine (NORVASC) 5 MG tablet Take 1 tablet (5 mg total) by mouth daily.   Marland Kitchen amoxicillin-clavulanate (AUGMENTIN) 875-125 MG tablet Take 1 tablet by mouth 2 (two) times daily for 8 days.   Marland Kitchen atenolol (TENORMIN) 25 MG tablet Take 25 mg by mouth daily.   . fluticasone (FLONASE) 50 MCG/ACT nasal spray Place 2 sprays into both nostrils every morning.   Marland Kitchen levonorgestrel (MIRENA) 20 MCG/24HR IUD 1 each by Intrauterine route once. 09/09/2020: Implantation 02/2020   . olmesartan (BENICAR) 40 MG tablet Take 40 mg by mouth daily.    No facility-administered encounter medications on file as of 09/18/2020.  Per hospitalist: " HOSPITAL COURSE:  1.  Acute diverticulitis in the proximal descending colon with robust inflammatory response surrounding a large inflamed diverticulum.  No perforation or abscess.  The patient was placed on empiric IV Zosyn and IV fluids.  The patient initially had quite a bit of pain but over the past 2 days has not had any pain whatsoever.  Patient tolerated advance  diet and was feeling better to go home.  Patient will be scribed Augmentin upon disposition. 2.  Leukocytosis.  White blood cell count came down from 17.4 on presentation to 7.9. 3.  Morbid obesity.  BMI of 42.07 4.  Essential hypertension.  Blood pressure still slightly on the lower side.  Can continue Norvasc but hold other blood pressure medications follow-up as outpatient. 5.  Acute UTI.  Unfortunately urine culture not sent from the ED.  Zosyn would cover most urinary infections anyway.  Not having any urinary symptoms."  Diagnostic Tests Reviewed:CLINICAL DATA:  LLQ pain X 2 days, hx diverticulitis  EXAM: CT ABDOMEN AND PELVIS WITH CONTRAST  TECHNIQUE: Multidetector CT imaging of the abdomen and pelvis was performed using the standard protocol following bolus administration of intravenous contrast.  CONTRAST:  110mL OMNIPAQUE IOHEXOL 300 MG/ML  SOLN  COMPARISON:  03/08/2013  FINDINGS: Lower chest: Lung bases are clear.  Hepatobiliary: No focal hepatic lesion. Postcholecystectomy. No biliary dilatation.  Pancreas: Pancreas is normal. No ductal dilatation. No pancreatic inflammation.  Spleen: Normal spleen  Adrenals/urinary tract: Adrenal glands and kidneys are normal. The ureters and bladder normal.  Stomach/Bowel: Stomach, small bowel, appendix, and cecum are normal. Several diverticula of the ascending colon. Transverse colon normal.  There is a large inflamed diverticulum extending along the anti mesenteric border of the proximal descending colon. The diverticulum measures 12 mm on image 40/3. There is fluid within  the RIGHT pericolic gutter at this level and inflammation within the adjacent pericolonic fat (images 35 through 47 series 3). No perforation or abscess formation.  The more distal descending colon is normal. There are several scattered diverticula of the sigmoid colon rectum normal.  Vascular/Lymphatic: Abdominal aorta is normal caliber  with atherosclerotic calcification. There is no retroperitoneal or periportal lymphadenopathy. No pelvic lymphadenopathy.  Reproductive: IUD in expected location in the uterus. No adnexal abnormality  Other: No free fluid.  Musculoskeletal: No aggressive osseous lesion.  IMPRESSION: 1. Acute diverticulitis of the proximal descending colon. Robust inflammatory response surrounding a large inflamed diverticulum. No perforation or abscess. 2. Scattered additional diverticula throughout the colon. 3. Aortic Atherosclerosis (ICD10-I70.0).  Disposition: Home  Consults: None  Discharge Instructions:  Follow up here  Disease/illness Education: Discussed today  Home Health/Community Services Discussions/Referrals: N/A  Establishment or re-establishment of referral orders for community resources: N/A  Discussion with other health care providers: None  Assessment and Support of treatment regimen adherence: Excellent  Appointments Coordinated with: patient  Education for self-management, independent living, and ADLs: Discussed today  Has been feeling much better. She notes that she has not been having any belly pain. No constipation or diarrhea. No nausea. No fevers or chills. She is feeling back to herself. No other concerns or complaints at this time.   Relevant past medical, surgical, family and social history reviewed and updated as indicated. Interim medical history since our last visit reviewed. Allergies and medications reviewed and updated.  Review of Systems  Constitutional: Negative.   Respiratory: Negative.   Cardiovascular: Negative.   Gastrointestinal: Negative.   Musculoskeletal: Negative.   Neurological: Negative.   Psychiatric/Behavioral: Negative.     Per HPI unless specifically indicated above     Objective:    BP 113/68   Pulse 62   Temp 98.1 F (36.7 C)   Wt 241 lb 9.6 oz (109.6 kg)   SpO2 98%   BMI 44.19 kg/m   Wt Readings from Last 3  Encounters:  09/18/20 241 lb 9.6 oz (109.6 kg)  09/09/20 230 lb (104.3 kg)  06/28/20 241 lb 9.6 oz (109.6 kg)    Physical Exam Vitals and nursing note reviewed.  Constitutional:      General: She is not in acute distress.    Appearance: Normal appearance. She is not ill-appearing, toxic-appearing or diaphoretic.  HENT:     Head: Normocephalic and atraumatic.     Right Ear: External ear normal.     Left Ear: External ear normal.     Nose: Nose normal.     Mouth/Throat:     Mouth: Mucous membranes are moist.     Pharynx: Oropharynx is clear.  Eyes:     General: No scleral icterus.       Right eye: No discharge.        Left eye: No discharge.     Extraocular Movements: Extraocular movements intact.     Conjunctiva/sclera: Conjunctivae normal.     Pupils: Pupils are equal, round, and reactive to light.  Cardiovascular:     Rate and Rhythm: Normal rate and regular rhythm.     Pulses: Normal pulses.     Heart sounds: Normal heart sounds. No murmur heard. No friction rub. No gallop.   Pulmonary:     Effort: Pulmonary effort is normal. No respiratory distress.     Breath sounds: Normal breath sounds. No stridor. No wheezing, rhonchi or rales.  Chest:     Chest wall:  No tenderness.  Abdominal:     General: Abdomen is flat. Bowel sounds are normal. There is no distension.     Palpations: Abdomen is soft. There is no mass.     Tenderness: There is no abdominal tenderness. There is no right CVA tenderness, left CVA tenderness, guarding or rebound.     Hernia: No hernia is present.  Musculoskeletal:        General: Normal range of motion.     Cervical back: Normal range of motion and neck supple.  Skin:    General: Skin is warm and dry.     Capillary Refill: Capillary refill takes less than 2 seconds.     Coloration: Skin is not jaundiced or pale.     Findings: No bruising, erythema, lesion or rash.  Neurological:     General: No focal deficit present.     Mental Status: She is  alert and oriented to person, place, and time. Mental status is at baseline.  Psychiatric:        Mood and Affect: Mood normal.        Behavior: Behavior normal.        Thought Content: Thought content normal.        Judgment: Judgment normal.     Results for orders placed or performed during the hospital encounter of 09/09/20  Resp Panel by RT-PCR (Flu A&B, Covid) Nasopharyngeal Swab   Specimen: Nasopharyngeal Swab; Nasopharyngeal(NP) swabs in vial transport medium  Result Value Ref Range   SARS Coronavirus 2 by RT PCR NEGATIVE NEGATIVE   Influenza A by PCR NEGATIVE NEGATIVE   Influenza B by PCR NEGATIVE NEGATIVE  Lipase, blood  Result Value Ref Range   Lipase 23 11 - 51 U/L  Comprehensive metabolic panel  Result Value Ref Range   Sodium 132 (L) 135 - 145 mmol/L   Potassium 3.9 3.5 - 5.1 mmol/L   Chloride 100 98 - 111 mmol/L   CO2 24 22 - 32 mmol/L   Glucose, Bld 146 (H) 70 - 99 mg/dL   BUN 11 6 - 20 mg/dL   Creatinine, Ser 0.85 0.44 - 1.00 mg/dL   Calcium 8.8 (L) 8.9 - 10.3 mg/dL   Total Protein 7.6 6.5 - 8.1 g/dL   Albumin 3.6 3.5 - 5.0 g/dL   AST 17 15 - 41 U/L   ALT 17 0 - 44 U/L   Alkaline Phosphatase 81 38 - 126 U/L   Total Bilirubin 1.0 0.3 - 1.2 mg/dL   GFR, Estimated >60 >60 mL/min   Anion gap 8 5 - 15  CBC  Result Value Ref Range   WBC 17.4 (H) 4.0 - 10.5 K/uL   RBC 4.29 3.87 - 5.11 MIL/uL   Hemoglobin 12.7 12.0 - 15.0 g/dL   HCT 38.1 36.0 - 46.0 %   MCV 88.8 80.0 - 100.0 fL   MCH 29.6 26.0 - 34.0 pg   MCHC 33.3 30.0 - 36.0 g/dL   RDW 12.5 11.5 - 15.5 %   Platelets 353 150 - 400 K/uL   nRBC 0.0 0.0 - 0.2 %  Urinalysis, Complete w Microscopic  Result Value Ref Range   Color, Urine YELLOW (A) YELLOW   APPearance CLOUDY (A) CLEAR   Specific Gravity, Urine 1.020 1.005 - 1.030   pH 5.0 5.0 - 8.0   Glucose, UA NEGATIVE NEGATIVE mg/dL   Hgb urine dipstick MODERATE (A) NEGATIVE   Bilirubin Urine NEGATIVE NEGATIVE   Ketones, ur NEGATIVE NEGATIVE mg/dL  Protein, ur 30 (A) NEGATIVE mg/dL   Nitrite NEGATIVE NEGATIVE   Leukocytes,Ua MODERATE (A) NEGATIVE   RBC / HPF 0-5 0 - 5 RBC/hpf   WBC, UA 6-10 0 - 5 WBC/hpf   Bacteria, UA RARE (A) NONE SEEN   Squamous Epithelial / LPF 21-50 0 - 5   Mucus PRESENT    Hyaline Casts, UA PRESENT   HIV Antibody (routine testing w rflx)  Result Value Ref Range   HIV Screen 4th Generation wRfx Non Reactive Non Reactive  Pregnancy, urine  Result Value Ref Range   Preg Test, Ur NEGATIVE NEGATIVE  Basic metabolic panel  Result Value Ref Range   Sodium 136 135 - 145 mmol/L   Potassium 3.9 3.5 - 5.1 mmol/L   Chloride 102 98 - 111 mmol/L   CO2 26 22 - 32 mmol/L   Glucose, Bld 125 (H) 70 - 99 mg/dL   BUN 12 6 - 20 mg/dL   Creatinine, Ser 1.05 (H) 0.44 - 1.00 mg/dL   Calcium 8.5 (L) 8.9 - 10.3 mg/dL   GFR, Estimated >60 >60 mL/min   Anion gap 8 5 - 15  CBC  Result Value Ref Range   WBC 13.2 (H) 4.0 - 10.5 K/uL   RBC 3.77 (L) 3.87 - 5.11 MIL/uL   Hemoglobin 11.4 (L) 12.0 - 15.0 g/dL   HCT 33.6 (L) 36.0 - 46.0 %   MCV 89.1 80.0 - 100.0 fL   MCH 30.2 26.0 - 34.0 pg   MCHC 33.9 30.0 - 36.0 g/dL   RDW 12.7 11.5 - 15.5 %   Platelets 336 150 - 400 K/uL   nRBC 0.0 0.0 - 0.2 %  CBC  Result Value Ref Range   WBC 7.9 4.0 - 10.5 K/uL   RBC 3.97 3.87 - 5.11 MIL/uL   Hemoglobin 11.9 (L) 12.0 - 15.0 g/dL   HCT 35.2 (L) 36.0 - 46.0 %   MCV 88.7 80.0 - 100.0 fL   MCH 30.0 26.0 - 34.0 pg   MCHC 33.8 30.0 - 36.0 g/dL   RDW 12.3 11.5 - 15.5 %   Platelets 315 150 - 400 K/uL   nRBC 0.0 0.0 - 0.2 %  Basic metabolic panel  Result Value Ref Range   Sodium 135 135 - 145 mmol/L   Potassium 4.0 3.5 - 5.1 mmol/L   Chloride 103 98 - 111 mmol/L   CO2 22 22 - 32 mmol/L   Glucose, Bld 113 (H) 70 - 99 mg/dL   BUN 10 6 - 20 mg/dL   Creatinine, Ser 0.96 0.44 - 1.00 mg/dL   Calcium 8.5 (L) 8.9 - 10.3 mg/dL   GFR, Estimated >60 >60 mL/min   Anion gap 10 5 - 15      Assessment & Plan:   Problem List Items Addressed  This Visit      Cardiovascular and Mediastinum   Essential hypertension    Running well on current regimen. She will call if she starts to get dizzy, otherwise we will leave her on current regimen. Recheck in March as scheduled.       Relevant Medications   atenolol (TENORMIN) 25 MG tablet   olmesartan (BENICAR) 40 MG tablet   Other Relevant Orders   Comprehensive metabolic panel     Digestive   Acute diverticulitis - Primary    Resolved. Checking labs today to confirm resolution of leukocytosis. Has 3 pills left on her augmentin.      Relevant Orders   CBC  With Differential/Platelet   Comprehensive metabolic panel     Genitourinary   Acute lower UTI    Resolved. Checking UA today to confirm resolution of UTI. Has 3 pills left on her augmentin.      Relevant Orders   Urinalysis, Routine w reflex microscopic     Other   Leukocytosis    Rechecking CBC today. Await results. Treat as needed.       Other Visit Diagnoses    Aortic atherosclerosis ([place])       Found incidently on CT abdomen. Will keep BP and cholesterol under good control. Continue to monitor   Relevant Medications   atenolol (TENORMIN) 25 MG tablet   olmesartan (BENICAR) 40 MG tablet       Follow up plan: Return March, physical with Santiago Glad.  >25 minutes spent with patient today

## 2020-09-18 NOTE — Assessment & Plan Note (Signed)
Resolved. Checking UA today to confirm resolution of UTI. Has 3 pills left on her augmentin.

## 2020-09-19 LAB — COMPREHENSIVE METABOLIC PANEL
ALT: 43 IU/L — ABNORMAL HIGH (ref 0–32)
AST: 18 IU/L (ref 0–40)
Albumin/Globulin Ratio: 1.4 (ref 1.2–2.2)
Albumin: 3.9 g/dL (ref 3.8–4.9)
Alkaline Phosphatase: 87 IU/L (ref 44–121)
BUN/Creatinine Ratio: 14 (ref 9–23)
BUN: 11 mg/dL (ref 6–24)
Bilirubin Total: 0.3 mg/dL (ref 0.0–1.2)
CO2: 23 mmol/L (ref 20–29)
Calcium: 9.1 mg/dL (ref 8.7–10.2)
Chloride: 100 mmol/L (ref 96–106)
Creatinine, Ser: 0.79 mg/dL (ref 0.57–1.00)
GFR calc Af Amer: 100 mL/min/{1.73_m2} (ref >59)
GFR calc non Af Amer: 87 mL/min/{1.73_m2} (ref >59)
Globulin, Total: 2.7 g/dL (ref 1.5–4.5)
Glucose: 107 mg/dL — ABNORMAL HIGH (ref 65–99)
Potassium: 4.5 mmol/L (ref 3.5–5.2)
Sodium: 136 mmol/L (ref 134–144)
Total Protein: 6.6 g/dL (ref 6.0–8.5)

## 2020-10-30 ENCOUNTER — Ambulatory Visit (INDEPENDENT_AMBULATORY_CARE_PROVIDER_SITE_OTHER): Payer: 59 | Admitting: Gastroenterology

## 2020-10-30 ENCOUNTER — Other Ambulatory Visit: Payer: Self-pay

## 2020-10-30 ENCOUNTER — Encounter: Payer: Self-pay | Admitting: Gastroenterology

## 2020-10-30 VITALS — BP 135/86 | HR 69 | Ht 62.0 in | Wt 247.0 lb

## 2020-10-30 DIAGNOSIS — Z8719 Personal history of other diseases of the digestive system: Secondary | ICD-10-CM

## 2020-10-30 MED ORDER — NA SULFATE-K SULFATE-MG SULF 17.5-3.13-1.6 GM/177ML PO SOLN: 1.0000 | Freq: Once | ORAL | 0 refills | Status: AC

## 2020-10-30 NOTE — Progress Notes (Signed)
Victoria Bellows MD, MRCP(U.K) [address]  [place]  [place], [place] [address]  Main: [phone]  Fax: [phone]   Gastroenterology Consultation  Referring Provider:     Volney Bowers,* Primary Care Physician:  Victoria Billings, NP Primary Gastroenterologist:  Dr. Jonathon Bowers  Reason for Consultation:     Diverticulitis        HPI:   Victoria Bowers is a 50 y.o. y/o female referred for consultation & management  by  Victoria Billings, NP.     She was recently admitted in December 2021 and discharged on 09/01/2020 when she presented with abdominal pain.  Was found to have acute diverticulitis affecting the proximal descending colon.  No perforation was noted.  Treated empirically and conservatively with antibiotics and IV fluids.  Discharged home.  During hospitalization her creatinine was 0.79.  And initially had a leukocytosis which improved closer to discharge hemoglobin was 11.4 g with an MCV of 89.1.  Since discharge she has been doing well.  Denies any abdominal pain.  Stools are still a bit soft but no rectal bleeding.  She has had one prior episode of diverticulitis about 16 years back.  No prior colonoscopy.  No family history of colon cancer or polyps. Past Medical History:  Diagnosis Date  . Allergy   . Bertolotti's syndrome   . Complication of anesthesia   . Fatty liver   . Fibroid   . GERD (gastroesophageal reflux disease)    OCC  . Hypertension   . Ovarian cyst   . PONV (postoperative nausea and vomiting)    NAUSEATED    Past Surgical History:  Procedure Laterality Date  . CERVICAL POLYPECTOMY    . CHOLECYSTECTOMY    . HYSTEROSCOPY WITH D & C N/A 03/28/2019   Procedure: DILATATION AND CURETTAGE /HYSTEROSCOPY;  Surgeon: Harlin Heys, MD;  Location: [facility] ORS;  Service: Gynecology;  Laterality: N/A;  . INTRAUTERINE DEVICE (IUD) INSERTION N/A 03/28/2019   Procedure: INTRAUTERINE DEVICE (IUD) INSERTION;  Surgeon: Harlin Heys, MD;   Location: [facility] ORS;  Service: Gynecology;  Laterality: N/A;  . NASAL SINUS SURGERY  X2  . SPINE SURGERY  ruptured disc   LUMBAR  . TONSILLECTOMY AND ADENOIDECTOMY  1985    Prior to Admission medications   Medication Sig Start Date End Date Taking? Authorizing Provider  amLODipine (NORVASC) 5 MG tablet Take 1 tablet (5 mg total) by mouth daily. 06/28/20   Victoria Bear, NP  atenolol (TENORMIN) 25 MG tablet Take 25 mg by mouth daily.    [provider]  fluticasone (FLONASE) 50 MCG/ACT nasal spray Place 2 sprays into both nostrils every morning.    [provider]  levonorgestrel (MIRENA) 20 MCG/24HR IUD 1 each by Intrauterine route once.    [provider]  olmesartan (BENICAR) 40 MG tablet Take 40 mg by mouth daily.    [provider]    Family History  Problem Relation Age of Onset  . Hypertension Father   . Hypertension Brother   . Hypertension Paternal Grandmother   . Hypertension Brother   . Heart disease Mother      Social History   Tobacco Use  . Smoking status: Never Smoker  . Smokeless tobacco: Never Used  Vaping Use  . Vaping Use: Never used  Substance Use Topics  . Alcohol use: No    Alcohol/week: 0.0 standard drinks  . Drug use: No    Allergies as of 10/30/2020 - Review Complete  09/18/2020  Allergen Reaction Noted  . Elemental sulfur Hives 07/10/2015  . Sulfa antibiotics Hives 03/09/2015    Review of Systems:    All systems reviewed and negative except where noted in HPI.   Physical Exam:  There were no vitals taken for this visit. No LMP recorded. (Menstrual status: IUD). Psych:  Alert and cooperative. Normal mood and affect. General:   Alert,  Well-developed, well-nourished, pleasant and cooperative in NAD Head:  Normocephalic and atraumatic. Eyes:  Sclera clear, no icterus.   Conjunctiva pink. Ears:  Normal auditory acuity. Lungs:  Respirations even and unlabored.  Clear throughout to auscultation.   No  wheezes, crackles, or rhonchi. No acute distress. Heart:  Regular rate and rhythm; no murmurs, clicks, rubs, or gallops. Abdomen:  Normal bowel sounds.  No bruits.  Soft, non-tender and non-distended without masses, hepatosplenomegaly or hernias noted.  No guarding or rebound tenderness.   . Neurologic:  Alert and oriented x3;  grossly normal neurologically. Psych:  Alert and cooperative. Normal mood and affect.  Imaging Studies: No results found.  Assessment and Plan:   Victoria Bowers is a 50 y.o. y/o female has been referred for recent episode of acute diverticulitis affecting the proximal descending colon noncomplicated that was treated conservatively with antibiotics.  She was discharged on 09/11/2020  Plan 1.  Colonoscopy to evaluate colon to rule out any precipitating causes for acute diverticulitis the third or fourth week of February  I have discussed alternative options, risks & benefits,  which include, but are not limited to, bleeding, infection, perforation,respiratory complication & drug reaction.  The patient agrees with this plan & written consent will be obtained.     Follow up as needed  Dr Victoria Bellows MD,MRCP(U.K)

## 2020-11-07 ENCOUNTER — Ambulatory Visit (INDEPENDENT_AMBULATORY_CARE_PROVIDER_SITE_OTHER): Payer: 59 | Admitting: Obstetrics and Gynecology

## 2020-11-07 ENCOUNTER — Other Ambulatory Visit: Payer: Self-pay

## 2020-11-07 ENCOUNTER — Encounter: Payer: Self-pay | Admitting: Obstetrics and Gynecology

## 2020-11-07 ENCOUNTER — Encounter: Payer: 59 | Admitting: Obstetrics and Gynecology

## 2020-11-14 ENCOUNTER — Ambulatory Visit (INDEPENDENT_AMBULATORY_CARE_PROVIDER_SITE_OTHER): Payer: 59 | Admitting: Obstetrics and Gynecology

## 2020-11-14 ENCOUNTER — Encounter: Payer: Self-pay | Admitting: Obstetrics and Gynecology

## 2020-11-14 ENCOUNTER — Other Ambulatory Visit: Payer: Self-pay

## 2020-11-14 VITALS — BP 117/74 | HR 71 | Ht 62.0 in | Wt 247.8 lb

## 2020-11-14 DIAGNOSIS — Z01419 Encounter for gynecological examination (general) (routine) without abnormal findings: Secondary | ICD-10-CM | POA: Diagnosis not present

## 2020-11-14 DIAGNOSIS — Z1231 Encounter for screening mammogram for malignant neoplasm of breast: Secondary | ICD-10-CM | POA: Diagnosis not present

## 2020-11-14 LAB — POCT URINALYSIS DIPSTICK OB
Bilirubin, UA: NEGATIVE
Blood, UA: NEGATIVE
Glucose, UA: NEGATIVE
Ketones, UA: NEGATIVE
Leukocytes, UA: NEGATIVE
Nitrite, UA: NEGATIVE
POC,PROTEIN,UA: NEGATIVE
Spec Grav, UA: 1.01 (ref 1.010–1.025)
Urobilinogen, UA: 0.2 E.U./dL
pH, UA: 6.5 (ref 5.0–8.0)

## 2020-11-14 NOTE — Progress Notes (Signed)
HPI:      [name] is a 50 y.o. No obstetric history on file. who LMP was No LMP recorded (lmp unknown). (Menstrual status: IUD).  Subjective:   She presents today for her annual examination.  She has no complaints.  She says that she is not having menstrual periods with her IUD.  This is a very welcome change for her.  She has no problems with intercourse. Of significant note she recently had acute onset of abdominal pain and was diagnosed with diverticulitis.  She was treated in the hospital for several days with antibiotics and recovered.  She is scheduled for a colonoscopy in 2 weeks. She has also had a flareup of "back issues" but has been seeing a chiropractor who is helping.    Hx: The following portions of the patient's history were reviewed and updated as appropriate:             She  has a past medical history of Allergy, Bertolotti's syndrome, Complication of anesthesia, Fatty liver, Fibroid, GERD (gastroesophageal reflux disease), Hypertension, Ovarian cyst, and PONV (postoperative nausea and vomiting). She does not have any pertinent problems on file. She  has a past surgical history that includes Cholecystectomy; Spine surgery (ruptured disc); Nasal sinus surgery (X2); Tonsillectomy and adenoidectomy (1985); Hysteroscopy with D & C (N/A, 03/28/2019); Intrauterine device (iud) insertion (N/A, 03/28/2019); and Cervical polypectomy. Her family history includes Heart disease in her mother; Hypertension in her brother, brother, father, and paternal grandmother. She  reports that she has never smoked. She has never used smokeless tobacco. She reports that she does not drink alcohol and does not use drugs. She has a current medication list which includes the following prescription(s): amlodipine, atenolol, fluticasone, levonorgestrel, and olmesartan. She is allergic to elemental sulfur and sulfa antibiotics.       Review of Systems:  Review of Systems  Constitutional: Denied  constitutional symptoms, night sweats, recent illness, fatigue, fever, insomnia and weight loss.  Eyes: Denied eye symptoms, eye pain, photophobia, vision change and visual disturbance.  Ears/Nose/Throat/Neck: Denied ear, nose, throat or neck symptoms, hearing loss, nasal discharge, sinus congestion and sore throat.  Cardiovascular: Denied cardiovascular symptoms, arrhythmia, chest pain/pressure, edema, exercise intolerance, orthopnea and palpitations.  Respiratory: Denied pulmonary symptoms, asthma, pleuritic pain, productive sputum, cough, dyspnea and wheezing.  Gastrointestinal: Denied, gastro-esophageal reflux, melena, nausea and vomiting.  Genitourinary: Denied genitourinary symptoms including symptomatic vaginal discharge, pelvic relaxation issues, and urinary complaints.  Musculoskeletal: Denied musculoskeletal symptoms, stiffness, swelling, muscle weakness and myalgia.  Dermatologic: Denied dermatology symptoms, rash and scar.  Neurologic: Denied neurology symptoms, dizziness, headache, neck pain and syncope.  Psychiatric: Denied psychiatric symptoms, anxiety and depression.  Endocrine: Denied endocrine symptoms including hot flashes and night sweats.   Meds:   Current Outpatient Medications on File Prior to Visit  Medication Sig Dispense Refill  . amLODipine (NORVASC) 5 MG tablet Take 1 tablet (5 mg total) by mouth daily. 90 tablet 1  . atenolol (TENORMIN) 25 MG tablet Take 25 mg by mouth daily.    . fluticasone (FLONASE) 50 MCG/ACT nasal spray Place 2 sprays into both nostrils every morning.    Marland Kitchen levonorgestrel (MIRENA) 20 MCG/24HR IUD 1 each by Intrauterine route once.    Marland Kitchen olmesartan (BENICAR) 40 MG tablet Take 40 mg by mouth daily.     No current facility-administered medications on file prior to visit.       The pregnancy intention screening data noted above was reviewed. Potential methods of  contraception were discussed. The patient elected to proceed with IUD or IUS.      Objective:     Vitals:   11/14/20 1438  BP: 117/74  Pulse: 71    Filed Weights   11/14/20 1438  Weight: 247 lb 12.8 oz (112.4 kg)              Physical examination General NAD, Conversant  HEENT Atraumatic; Op clear with mmm.  Normo-cephalic. Pupils reactive. Anicteric sclerae  Thyroid/Neck Smooth without nodularity or enlargement. Normal ROM.  Neck Supple.  Skin No rashes, lesions or ulceration. Normal palpated skin turgor. No nodularity.  Breasts: No masses or discharge.  Symmetric.  No axillary adenopathy.  Lungs: Clear to auscultation.No rales or wheezes. Normal Respiratory effort, no retractions.  Heart: NSR.  No murmurs or rubs appreciated. No periferal edema  Abdomen: Soft.  Non-tender.  No masses.  No HSM. No hernia  Extremities: Moves all appropriately.  Normal ROM for age. No lymphadenopathy.  Neuro: Oriented to PPT.  Normal mood. Normal affect.     Pelvic:   Vulva: Normal appearance.  No lesions.  Vagina: No lesions or abnormalities noted.  Support: Normal pelvic support.  Urethra No masses tenderness or scarring.  Meatus Normal size without lesions or prolapse.  Cervix: Normal appearance.  No lesions.  Anus: Normal exam.  No lesions.  Perineum: Normal exam.  No lesions.        Bimanual   Uterus: Normal size.  Non-tender.  Mobile.  AV.  Adnexae: No masses.  Non-tender to palpation.  Cul-de-sac: Negative for abnormality.      Assessment:    No obstetric history on file. Patient Active Problem List   Diagnosis Date Noted  . Leukocytosis   . Obesity, Class III, BMI 40-49.9 (morbid obesity) ([place])   . Acute diverticulitis 09/09/2020  . Acute lower UTI 09/09/2020  . Disorder of skin of trunk 03/18/2018  . Displacement of lumbar intervertebral disc without myelopathy 03/18/2018  . Inflammation of sacroiliac joint ([place]) 03/18/2018  . Low back pain 03/18/2018  . Vitamin D deficiency 05/15/2017  . DUB (dysfunctional uterine bleeding) 03/21/2016  . Left  ovarian cyst 03/19/2016  . Abnormal bleeding in menstrual cycle 03/10/2016  . Left elbow tendonitis 11/28/2015  . Fatty liver 07/16/2015  . Migraine 07/16/2015  . Essential hypertension 03/09/2015  . Obesity 03/09/2015     1. Well woman exam with routine gynecological exam   2. Screening mammogram for breast cancer     Patient doing very well   Plan:            1.  Basic Screening Recommendations The basic screening recommendations for asymptomatic women were discussed with the patient during her visit.  The age-appropriate recommendations were discussed with her and the rational for the tests reviewed.  When I am informed by the patient that another primary care physician has previously obtained the age-appropriate tests and they are up-to-date, only outstanding tests are ordered and referrals given as necessary.  Abnormal results of tests will be discussed with her when all of her results are completed.  Routine preventative health maintenance measures emphasized: Exercise/Diet/Weight control, Tobacco Warnings, Alcohol/Substance use risks and Stress Management Mammogram ordered -PCP blood work  Orders Orders Placed This Encounter  Procedures  . MM 3D SCREEN BREAST BILATERAL  . POC Urinalysis Dipstick OB    No orders of the defined types were placed in this encounter.         F/U  No follow-ups  on file.  Finis Bud, M.D. 11/14/2020 3:00 PM

## 2020-11-22 ENCOUNTER — Other Ambulatory Visit
Admission: RE | Admit: 2020-11-22 | Discharge: 2020-11-22 | Disposition: A | Discharge status: Home / Self Care | Payer: 59 | Source: Ambulatory Visit | Attending: Gastroenterology | Admitting: Gastroenterology

## 2020-11-22 ENCOUNTER — Other Ambulatory Visit: Payer: Self-pay

## 2020-11-22 DIAGNOSIS — Z01812 Encounter for preprocedural laboratory examination: Secondary | ICD-10-CM | POA: Insufficient documentation

## 2020-11-22 DIAGNOSIS — Z20822 Contact with and (suspected) exposure to covid-19: Secondary | ICD-10-CM | POA: Insufficient documentation

## 2020-11-22 LAB — SARS CORONAVIRUS 2 (TAT 6-24 HRS): SARS Coronavirus 2: NEGATIVE

## 2020-11-23 ENCOUNTER — Encounter: Payer: Self-pay | Admitting: Gastroenterology

## 2020-11-26 ENCOUNTER — Ambulatory Visit
Admission: RE | Admit: 2020-11-26 | Discharge: 2020-11-26 | Disposition: A | Discharge status: Home / Self Care | Payer: 59 | Attending: Gastroenterology | Admitting: Gastroenterology

## 2020-11-26 ENCOUNTER — Encounter: Payer: Self-pay | Admitting: Gastroenterology

## 2020-11-26 ENCOUNTER — Ambulatory Visit: Payer: 59 | Admitting: Anesthesiology

## 2020-11-26 ENCOUNTER — Other Ambulatory Visit: Payer: Self-pay

## 2020-11-26 ENCOUNTER — Telehealth: Payer: Self-pay | Admitting: Gastroenterology

## 2020-11-26 ENCOUNTER — Encounter
Admission: RE | Disposition: A | Discharge status: Home / Self Care | Payer: Self-pay | Source: Home / Self Care | Attending: Gastroenterology

## 2020-11-26 DIAGNOSIS — Z8249 Family history of ischemic heart disease and other diseases of the circulatory system: Secondary | ICD-10-CM | POA: Diagnosis not present

## 2020-11-26 DIAGNOSIS — K219 Gastro-esophageal reflux disease without esophagitis: Secondary | ICD-10-CM | POA: Diagnosis not present

## 2020-11-26 DIAGNOSIS — Z8719 Personal history of other diseases of the digestive system: Secondary | ICD-10-CM | POA: Diagnosis not present

## 2020-11-26 DIAGNOSIS — Z79899 Other long term (current) drug therapy: Secondary | ICD-10-CM | POA: Insufficient documentation

## 2020-11-26 DIAGNOSIS — Z888 Allergy status to other drugs, medicaments and biological substances status: Secondary | ICD-10-CM | POA: Insufficient documentation

## 2020-11-26 DIAGNOSIS — Z793 Long term (current) use of hormonal contraceptives: Secondary | ICD-10-CM | POA: Diagnosis not present

## 2020-11-26 DIAGNOSIS — Z5309 Procedure and treatment not carried out because of other contraindication: Secondary | ICD-10-CM | POA: Diagnosis not present

## 2020-11-26 DIAGNOSIS — K5732 Diverticulitis of large intestine without perforation or abscess without bleeding: Secondary | ICD-10-CM | POA: Insufficient documentation

## 2020-11-26 HISTORY — PX: COLONOSCOPY WITH PROPOFOL: SHX5780

## 2020-11-26 LAB — POCT PREGNANCY, URINE: Preg Test, Ur: NEGATIVE

## 2020-11-26 SURGERY — COLONOSCOPY WITH PROPOFOL
Anesthesia: Regional

## 2020-11-26 MED ORDER — PROPOFOL 10 MG/ML IV BOLUS
INTRAVENOUS | Status: AC
  Filled 2020-11-26: qty 20

## 2020-11-26 MED ORDER — LIDOCAINE HCL (PF) 2 % IJ SOLN
INTRAMUSCULAR | Status: AC
  Filled 2020-11-26: qty 15

## 2020-11-26 MED ORDER — PROPOFOL 500 MG/50ML IV EMUL
INTRAVENOUS | Status: DC | PRN
  Administered 2020-11-26: 50 ug/kg/min via INTRAVENOUS

## 2020-11-26 MED ORDER — PROPOFOL 10 MG/ML IV BOLUS
INTRAVENOUS | Status: DC | PRN
  Administered 2020-11-26: 60 mg via INTRAVENOUS

## 2020-11-26 MED ORDER — LIDOCAINE HCL (CARDIAC) PF 100 MG/5ML IV SOSY
PREFILLED_SYRINGE | INTRAVENOUS | Status: DC | PRN
  Administered 2020-11-26: 100 mg via INTRAVENOUS

## 2020-11-26 MED ORDER — SODIUM CHLORIDE 0.9 % IV SOLN: INTRAVENOUS | Status: DC

## 2020-11-26 MED ORDER — PROPOFOL 10 MG/ML IV BOLUS
INTRAVENOUS | Status: AC
  Filled 2020-11-26: qty 100

## 2020-11-26 NOTE — Anesthesia Preprocedure Evaluation (Signed)
Anesthesia Evaluation  Patient identified by MRN, date of birth, ID band Patient awake    Reviewed: Allergy & Precautions, H&P , NPO status , Patient's Chart, lab work & pertinent test results, reviewed documented beta blocker date and time   History of Anesthesia Complications (+) PONV and history of anesthetic complications  Airway Mallampati: II  TM Distance: >3 FB Neck ROM: full    Dental  (+) Teeth Intact   Pulmonary neg pulmonary ROS, neg sleep apnea, neg COPD, Patient abstained from smoking.Not current smoker,    Pulmonary exam normal        Cardiovascular Exercise Tolerance: Good METShypertension, On Medications (-) CAD and (-) Past MI Normal cardiovascular exam(-) dysrhythmias  Rhythm:Regular Rate:Normal     Neuro/Psych  Headaches,  Neuromuscular disease negative psych ROS   GI/Hepatic Neg liver ROS, GERD  Medicated,  Endo/Other  neg diabetesMorbid obesity  Renal/GU negative Renal ROS  negative genitourinary   Musculoskeletal   Abdominal   Peds  Hematology negative hematology ROS (+)   Anesthesia Other Findings Past Medical History: No date: Allergy No date: Bertolotti's syndrome No date: Complication of anesthesia No date: Fatty liver No date: Fibroid No date: GERD (gastroesophageal reflux disease)     Comment:  OCC No date: Hypertension No date: Ovarian cyst No date: PONV (postoperative nausea and vomiting)     Comment:  NAUSEATED  Reproductive/Obstetrics negative OB ROS                             Anesthesia Physical  Anesthesia Plan  ASA: III  Anesthesia Plan: Regional and General   Post-op Pain Management:    Induction: Intravenous  PONV Risk Score and Plan: 4 or greater and Ondansetron, Propofol infusion and TIVA  Airway Management Planned: Nasal Cannula  Additional Equipment: None  Intra-op Plan:   Post-operative Plan:   Informed Consent: I have  reviewed the patients History and Physical, chart, labs and discussed the procedure including the risks, benefits and alternatives for the proposed anesthesia with the patient or authorized representative who has indicated his/her understanding and acceptance.     Dental advisory given  Plan Discussed with: CRNA  Anesthesia Plan Comments: (Discussed risks of anesthesia with patient, including possibility of difficulty with spontaneous ventilation under anesthesia necessitating airway intervention, PONV, and rare risks such as cardiac or respiratory or neurological events. Patient understands. Patient informed about increased incidence of above perioperative risk due to high BMI. Patient understands. )        Anesthesia Quick Evaluation

## 2020-11-26 NOTE — Progress Notes (Signed)
Poor prep aborted case

## 2020-11-26 NOTE — H&P (Signed)
Jonathon Bellows, MD [address] [place], [place], [place], [address] [address], [place], [place], [place], [address] Phone: [phone]  Fax: [phone]  Primary Care Physician:  Jon Billings, NP   Pre-Procedure History & Physical: HPI:  Victoria Bowers is a 50 y.o. female is here for an colonoscopy.   Past Medical History:  Diagnosis Date  . Allergy   . Bertolotti's syndrome   . Complication of anesthesia   . Fatty liver   . Fibroid   . GERD (gastroesophageal reflux disease)    OCC  . Hypertension   . Ovarian cyst   . PONV (postoperative nausea and vomiting)    NAUSEATED    Past Surgical History:  Procedure Laterality Date  . CERVICAL POLYPECTOMY    . CHOLECYSTECTOMY    . HYSTEROSCOPY WITH D & C N/A 03/28/2019   Procedure: DILATATION AND CURETTAGE /HYSTEROSCOPY;  Surgeon: Harlin Heys, MD;  Location: [facility] ORS;  Service: Gynecology;  Laterality: N/A;  . INTRAUTERINE DEVICE (IUD) INSERTION N/A 03/28/2019   Procedure: INTRAUTERINE DEVICE (IUD) INSERTION;  Surgeon: Harlin Heys, MD;  Location: [facility] ORS;  Service: Gynecology;  Laterality: N/A;  . NASAL SINUS SURGERY  X2  . SPINE SURGERY  ruptured disc   LUMBAR  . TONSILLECTOMY    . TONSILLECTOMY AND ADENOIDECTOMY  1985    Prior to Admission medications   Medication Sig Start Date End Date Taking? Authorizing Provider  amLODipine (NORVASC) 5 MG tablet Take 1 tablet (5 mg total) by mouth daily. 06/28/20  Yes Noemi Chapel A, NP  atenolol (TENORMIN) 25 MG tablet Take 25 mg by mouth daily.   Yes [provider]  fluticasone (FLONASE) 50 MCG/ACT nasal spray Place 2 sprays into both nostrils every morning.   Yes [provider]  olmesartan (BENICAR) 40 MG tablet Take 40 mg by mouth daily.   Yes [provider]  levonorgestrel (MIRENA) 20 MCG/24HR IUD 1 each by Intrauterine route once.    [provider]    Allergies as of 10/30/2020 - Review Complete  10/30/2020  Allergen Reaction Noted  . Elemental sulfur Hives 07/10/2015  . Sulfa antibiotics Hives 03/09/2015    Family History  Problem Relation Age of Onset  . Hypertension Father   . Hypertension Brother   . Hypertension Paternal Grandmother   . Hypertension Brother   . Heart disease Mother     Social History   Socioeconomic History  . Marital status: Married    Spouse name: Not on file  . Number of children: Not on file  . Years of education: Not on file  . Highest education level: Not on file  Occupational History  . Not on file  Tobacco Use  . Smoking status: Never Smoker  . Smokeless tobacco: Never Used  Vaping Use  . Vaping Use: Never used  Substance and Sexual Activity  . Alcohol use: No    Alcohol/week: 0.0 standard drinks  . Drug use: No  . Sexual activity: Yes  Other Topics Concern  . Not on file  Social History Narrative  . Not on file   Social Determinants of Health   Financial Resource Strain: Not on file  Food Insecurity: Not on file  Transportation Needs: Not on file  Physical Activity: Not on file  Stress: Not on file  Social Connections: Not on file  Intimate Partner Violence: Not on file    Review of Systems: See HPI, otherwise negative ROS  Physical Exam:  BP (!) 124/57   Pulse 64   Temp 98.6 F (37 C) (Temporal)   Resp 18   Ht 5\' 2"  (1.575 m)   Wt 112.4 kg   SpO2 100%   BMI 45.32 kg/m  General:   Alert,  pleasant and cooperative in NAD Head:  Normocephalic and atraumatic. Neck:  Supple; no masses or thyromegaly. Lungs:  Clear throughout to auscultation, normal respiratory effort.    Heart:  +S1, +S2, Regular rate and rhythm, No edema. Abdomen:  Soft, nontender and nondistended. Normal bowel sounds, without guarding, and without rebound.   Neurologic:  Alert and  oriented x4;  grossly normal neurologically.  Impression/Plan: Victoria Bowers is here for an colonoscopy to be performed for diverticulitis Risks, benefits,  limitations, and alternatives regarding  colonoscopy have been reviewed with the patient.  Questions have been answered.  All parties agreeable.   Jonathon Bellows, MD  11/26/2020, 9:38 AM

## 2020-11-26 NOTE — Transfer of Care (Signed)
Immediate Anesthesia Transfer of Care Note  Patient: Victoria Bowers  Procedure(s) Performed: COLONOSCOPY WITH PROPOFOL (N/A )  Patient Location: PACU and Endoscopy Unit  Anesthesia Type:General  Level of Consciousness: awake, drowsy and patient cooperative  Airway & Oxygen Therapy: Patient Spontanous Breathing  Post-op Assessment: Report given to RN and Post -op Vital signs reviewed and stable  Post vital signs: Reviewed and stable  Last Vitals:  Vitals Value Taken Time  BP 109/52 11/26/20 0951  Temp 36.7 C 11/26/20 0951  Pulse 69 11/26/20 0952  Resp 22 11/26/20 0952  SpO2 100 % 11/26/20 0952  Vitals shown include unvalidated device data.  Last Pain:  Vitals:   11/26/20 0951  TempSrc: Temporal  PainSc: 0-No pain         Complications: No complications documented.

## 2020-11-26 NOTE — Anesthesia Postprocedure Evaluation (Signed)
Anesthesia Post Note  Patient: Victoria Bowers  Procedure(s) Performed: COLONOSCOPY WITH PROPOFOL (N/A )  Patient location during evaluation: Endoscopy Anesthesia Type: Regional Level of consciousness: awake and alert Pain management: pain level controlled Vital Signs Assessment: post-procedure vital signs reviewed and stable Respiratory status: spontaneous breathing, nonlabored ventilation, respiratory function stable and patient connected to nasal cannula oxygen Cardiovascular status: blood pressure returned to baseline and stable Postop Assessment: no apparent nausea or vomiting Anesthetic complications: no   No complications documented.   Last Vitals:  Vitals:   11/26/20 1001 11/26/20 1011  BP: 122/64 131/64  Pulse: 60 (!) 57  Resp: 16 18  Temp:    SpO2: 100% 100%    Last Pain:  Vitals:   11/26/20 1011  TempSrc:   PainSc: 0-No pain                 Arita Miss

## 2020-11-26 NOTE — Telephone Encounter (Signed)
Patients husband called, Konstantina wasn't properly cleaned out. She needs a different bowel prep and to r/s colonoscopy.

## 2020-11-26 NOTE — Op Note (Signed)
[facility] Gastroenterology Patient Name: Victoria Bowers Procedure Date: 11/26/2020 9:07 AM MRN: [id number] Account #: [id number] Date of Birth: 03/06/1971 Admit Type: Outpatient Age: 50 Room: [facility] ENDO ROOM 4 Gender: Female Note Status: Finalized Procedure:             Colonoscopy Indications:           Follow-up of diverticulitis Providers:             Jonathon Bellows MD, MD Referring MD:          Lavera Guise, MD (Referring MD) Medicines:             Monitored Anesthesia Care Complications:         No immediate complications. Procedure:             Pre-Anesthesia Assessment:                        - Prior to the procedure, a History and Physical was                         performed, and patient medications, allergies and                         sensitivities were reviewed. The patient's tolerance                         of previous anesthesia was reviewed.                        - The risks and benefits of the procedure and the                         sedation options and risks were discussed with the                         patient. All questions were answered and informed                         consent was obtained.                        After obtaining informed consent, the colonoscope was                         passed under direct vision. Throughout the procedure,                         the patient's blood pressure, pulse, and oxygen                         saturations were monitored continuously. The                         Colonoscope was introduced through the anus and                         advanced to the the cecum, identified by the                         appendiceal orifice. The Colonoscope  was introduced                         through the anus with the intention of advancing to                         the cecum. The scope was advanced to the sigmoid colon                         before the procedure was aborted. Medications were                          given. The colonoscopy was performed with ease. The                         patient tolerated the procedure well. The quality of                         the bowel preparation was inadequate. Findings:      The perianal and digital rectal examinations were normal.      Stool was found in the rectum and in the sigmoid colon. Impression:            - Preparation of the colon was inadequate.                        - Stool in the rectum and in the sigmoid colon.                        - No specimens collected. Recommendation:        - Discharge patient to home (with escort).                        - Resume previous diet.                        - Continue present medications.                        - Repeat colonoscopy in 2 weeks because the bowel                         preparation was suboptimal. Procedure Code(s):     --- Professional ---                        [phone], 53, Colonoscopy, flexible; diagnostic,                         including collection of specimen(s) by brushing or                         washing, when performed (separate procedure) Diagnosis Code(s):     --- Professional ---                        K93.81, Diverticulitis of large intestine without                         perforation or abscess without bleeding CPT copyright 2019 American Medical  Association. All rights reserved. The codes documented in this report are preliminary and upon coder review may  be revised to meet current compliance requirements. Jonathon Bellows, MD Jonathon Bellows MD, MD 11/26/2020 9:47:47 AM This report has been signed electronically. Number of Addenda: 0 Note Initiated On: 11/26/2020 9:07 AM Total Procedure Duration: 0 hours 0 minutes 45 seconds  Estimated Blood Loss:  Estimated blood loss: none.      [facility]

## 2020-11-27 NOTE — Telephone Encounter (Signed)
Patient called to r/s procedure and new bowel prep as she wasn't properly cleaned out.

## 2020-11-28 ENCOUNTER — Telehealth: Payer: Self-pay

## 2020-11-28 ENCOUNTER — Other Ambulatory Visit: Payer: Self-pay

## 2020-11-28 DIAGNOSIS — Z8719 Personal history of other diseases of the digestive system: Secondary | ICD-10-CM

## 2020-11-28 MED ORDER — PEG 3350-KCL-NABCB-NACL-NASULF 236 G PO SOLR: ORAL | 0 refills | Status: DC

## 2020-11-28 NOTE — Telephone Encounter (Signed)
Spoke with pt and was able to schedule repeat procedure.

## 2020-11-28 NOTE — Telephone Encounter (Signed)
Called patient to schedule a repeat colonoscopy per Dr. Vicente Males. LVM to call office back.

## 2020-11-29 ENCOUNTER — Encounter: Payer: Self-pay | Admitting: Gastroenterology

## 2020-12-13 ENCOUNTER — Other Ambulatory Visit: Payer: Self-pay

## 2020-12-13 ENCOUNTER — Other Ambulatory Visit
Admission: RE | Admit: 2020-12-13 | Discharge: 2020-12-13 | Disposition: A | Discharge status: Home / Self Care | Payer: 59 | Source: Ambulatory Visit | Attending: Gastroenterology | Admitting: Gastroenterology

## 2020-12-13 DIAGNOSIS — Z20822 Contact with and (suspected) exposure to covid-19: Secondary | ICD-10-CM | POA: Diagnosis not present

## 2020-12-13 DIAGNOSIS — Z01812 Encounter for preprocedural laboratory examination: Secondary | ICD-10-CM | POA: Diagnosis not present

## 2020-12-14 LAB — SARS CORONAVIRUS 2 (TAT 6-24 HRS): SARS Coronavirus 2: NEGATIVE

## 2020-12-17 ENCOUNTER — Ambulatory Visit: Payer: 59 | Admitting: Certified Registered Nurse Anesthetist

## 2020-12-17 ENCOUNTER — Encounter
Admission: RE | Disposition: A | Discharge status: Home / Self Care | Payer: Self-pay | Source: Home / Self Care | Attending: Gastroenterology

## 2020-12-17 ENCOUNTER — Ambulatory Visit
Admission: RE | Admit: 2020-12-17 | Discharge: 2020-12-17 | Disposition: A | Discharge status: Home / Self Care | Payer: 59 | Attending: Gastroenterology | Admitting: Gastroenterology

## 2020-12-17 ENCOUNTER — Encounter: Payer: Self-pay | Admitting: Gastroenterology

## 2020-12-17 DIAGNOSIS — Z79899 Other long term (current) drug therapy: Secondary | ICD-10-CM | POA: Insufficient documentation

## 2020-12-17 DIAGNOSIS — K5730 Diverticulosis of large intestine without perforation or abscess without bleeding: Secondary | ICD-10-CM | POA: Diagnosis not present

## 2020-12-17 DIAGNOSIS — Z09 Encounter for follow-up examination after completed treatment for conditions other than malignant neoplasm: Secondary | ICD-10-CM | POA: Insufficient documentation

## 2020-12-17 DIAGNOSIS — Z8719 Personal history of other diseases of the digestive system: Secondary | ICD-10-CM | POA: Diagnosis not present

## 2020-12-17 DIAGNOSIS — Z882 Allergy status to sulfonamides status: Secondary | ICD-10-CM | POA: Diagnosis not present

## 2020-12-17 HISTORY — PX: COLONOSCOPY WITH PROPOFOL: SHX5780

## 2020-12-17 SURGERY — COLONOSCOPY WITH PROPOFOL
Anesthesia: General

## 2020-12-17 MED ORDER — PROPOFOL 10 MG/ML IV BOLUS
INTRAVENOUS | Status: AC
  Filled 2020-12-17: qty 20

## 2020-12-17 MED ORDER — PROPOFOL 10 MG/ML IV BOLUS
INTRAVENOUS | Status: DC | PRN
Start: 2020-12-17 — End: 2020-12-17
  Administered 2020-12-17: 80 mg via INTRAVENOUS
  Administered 2020-12-17: 60 mg via INTRAVENOUS

## 2020-12-17 MED ORDER — PROPOFOL 500 MG/50ML IV EMUL
INTRAVENOUS | Status: AC
  Filled 2020-12-17: qty 50

## 2020-12-17 MED ORDER — LIDOCAINE HCL (CARDIAC) PF 100 MG/5ML IV SOSY
PREFILLED_SYRINGE | INTRAVENOUS | Status: DC | PRN
  Administered 2020-12-17: 50 mg via INTRAVENOUS

## 2020-12-17 MED ORDER — PROPOFOL 500 MG/50ML IV EMUL
INTRAVENOUS | Status: DC | PRN
  Administered 2020-12-17: 175 ug/kg/min via INTRAVENOUS

## 2020-12-17 MED ORDER — SODIUM CHLORIDE 0.9 % IV SOLN
INTRAVENOUS | Status: DC
  Administered 2020-12-17: 20 mL/h via INTRAVENOUS

## 2020-12-17 NOTE — Anesthesia Preprocedure Evaluation (Signed)
Anesthesia Evaluation  Patient identified by MRN, date of birth, ID band Patient awake    Reviewed: Allergy & Precautions, H&P , NPO status , Patient's Chart, lab work & pertinent test results, reviewed documented beta blocker date and time   History of Anesthesia Complications (+) PONV and history of anesthetic complications  Airway Mallampati: II  TM Distance: >3 FB Neck ROM: full    Dental  (+) Teeth Intact, Dental Advidsory Given   Pulmonary neg pulmonary ROS, neg shortness of breath, neg sleep apnea, neg COPD, neg recent URI, Patient abstained from smoking.Not current smoker,    Pulmonary exam normal        Cardiovascular Exercise Tolerance: Good METShypertension, On Medications (-) angina(-) CAD and (-) Past MI Normal cardiovascular exam(-) dysrhythmias  Rhythm:Regular Rate:Normal     Neuro/Psych  Headaches,  Neuromuscular disease negative psych ROS   GI/Hepatic Neg liver ROS, GERD  Medicated,  Endo/Other  neg diabetesMorbid obesity  Renal/GU negative Renal ROS  negative genitourinary   Musculoskeletal   Abdominal   Peds  Hematology negative hematology ROS (+)   Anesthesia Other Findings Past Medical History: No date: Allergy No date: Bertolotti's syndrome No date: Complication of anesthesia No date: Fatty liver No date: Fibroid No date: GERD (gastroesophageal reflux disease)     Comment:  OCC No date: Hypertension No date: Ovarian cyst No date: PONV (postoperative nausea and vomiting)     Comment:  NAUSEATED  Reproductive/Obstetrics negative OB ROS                             Anesthesia Physical  Anesthesia Plan  ASA: III  Anesthesia Plan: Regional and General   Post-op Pain Management:    Induction: Intravenous  PONV Risk Score and Plan: 4 or greater and Propofol infusion and TIVA  Airway Management Planned: Nasal Cannula and Natural Airway  Additional  Equipment: None  Intra-op Plan:   Post-operative Plan:   Informed Consent: I have reviewed the patients History and Physical, chart, labs and discussed the procedure including the risks, benefits and alternatives for the proposed anesthesia with the patient or authorized representative who has indicated his/her understanding and acceptance.     Dental advisory given  Plan Discussed with: CRNA  Anesthesia Plan Comments: (Discussed risks of anesthesia with patient, including possibility of difficulty with spontaneous ventilation under anesthesia necessitating airway intervention, PONV, and rare risks such as cardiac or respiratory or neurological events. Patient understands. Patient informed about increased incidence of above perioperative risk due to high BMI. Patient understands. )        Anesthesia Quick Evaluation

## 2020-12-17 NOTE — H&P (Signed)
Jonathon Bellows, MD [address] [place], [place], [place], [address] [address], [place], [place], [place], [address] Phone: [phone]  Fax: [phone]  Primary Care Physician:  Jon Billings, NP   Pre-Procedure History & Physical: HPI:  Victoria Bowers is a 50 y.o. female is here for an colonoscopy.   Past Medical History:  Diagnosis Date  . Allergy   . Bertolotti's syndrome   . Complication of anesthesia   . Fatty liver   . Fibroid   . GERD (gastroesophageal reflux disease)    OCC  . Hypertension   . Ovarian cyst   . PONV (postoperative nausea and vomiting)    NAUSEATED    Past Surgical History:  Procedure Laterality Date  . CERVICAL POLYPECTOMY    . CHOLECYSTECTOMY    . COLONOSCOPY WITH PROPOFOL N/A 11/26/2020   Procedure: COLONOSCOPY WITH PROPOFOL;  Surgeon: Jonathon Bellows, MD;  Location: [facility] ENDOSCOPY;  Service: Gastroenterology;  Laterality: N/A;  . HYSTEROSCOPY WITH D & C N/A 03/28/2019   Procedure: DILATATION AND CURETTAGE /HYSTEROSCOPY;  Surgeon: Harlin Heys, MD;  Location: [facility] ORS;  Service: Gynecology;  Laterality: N/A;  . INTRAUTERINE DEVICE (IUD) INSERTION N/A 03/28/2019   Procedure: INTRAUTERINE DEVICE (IUD) INSERTION;  Surgeon: Harlin Heys, MD;  Location: [facility] ORS;  Service: Gynecology;  Laterality: N/A;  . NASAL SINUS SURGERY  X2  . SPINE SURGERY  ruptured disc   LUMBAR  . TONSILLECTOMY    . TONSILLECTOMY AND ADENOIDECTOMY  1985    Prior to Admission medications   Medication Sig Start Date End Date Taking? Authorizing Provider  amLODipine (NORVASC) 5 MG tablet Take 1 tablet (5 mg total) by mouth daily. 06/28/20  Yes Noemi Chapel A, NP  atenolol (TENORMIN) 25 MG tablet Take 25 mg by mouth daily.   Yes [provider]  fluticasone (FLONASE) 50 MCG/ACT nasal spray Place 2 sprays into both nostrils every morning.   Yes [provider]  levonorgestrel (MIRENA) 20 MCG/24HR IUD 1 each by Intrauterine route  once.   Yes [provider]  olmesartan (BENICAR) 40 MG tablet Take 40 mg by mouth daily.   Yes [provider]  polyethylene glycol (GOLYTELY) 236 g solution Drink 8 oz every 20-30 minutes until entire prep is finished 11/28/20  Yes Jonathon Bellows, MD    Allergies as of 11/28/2020 - Review Complete 11/26/2020  Allergen Reaction Noted  . Elemental sulfur Hives 07/10/2015  . Sulfa antibiotics Hives 03/09/2015    Family History  Problem Relation Age of Onset  . Hypertension Father   . Hypertension Brother   . Hypertension Paternal Grandmother   . Hypertension Brother   . Heart disease Mother     Social History   Socioeconomic History  . Marital status: Married    Spouse name: Not on file  . Number of children: Not on file  . Years of education: Not on file  . Highest education level: Not on file  Occupational History  . Not on file  Tobacco Use  . Smoking status: Never Smoker  . Smokeless tobacco: Never Used  Vaping Use  . Vaping Use: Never used  Substance and Sexual Activity  . Alcohol use: No    Alcohol/week: 0.0 standard drinks  . Drug use: No  . Sexual activity: Yes  Other Topics Concern  . Not on file  Social History Narrative  . Not on file   Social Determinants of Health   Financial Resource Strain: Not  on file  Food Insecurity: Not on file  Transportation Needs: Not on file  Physical Activity: Not on file  Stress: Not on file  Social Connections: Not on file  Intimate Partner Violence: Not on file    Review of Systems: See HPI, otherwise negative ROS  Physical Exam: BP 133/74   Pulse (!) 57   Temp (!) 96.9 F (36.1 C) (Temporal)   Resp 20   Ht 5\' 2"  (1.575 m)   Wt 108.9 kg   SpO2 100%   BMI 43.90 kg/m  General:   Alert,  pleasant and cooperative in NAD Head:  Normocephalic and atraumatic. Neck:  Supple; no masses or thyromegaly. Lungs:  Clear throughout to auscultation, normal respiratory effort.    Heart:  +S1, +S2, Regular  rate and rhythm, No edema. Abdomen:  Soft, nontender and nondistended. Normal bowel sounds, without guarding, and without rebound.   Neurologic:  Alert and  oriented x4;  grossly normal neurologically.  Impression/Plan: Victoria Bowers is here for an colonoscopy to be performed for diverticulitis.  Risks, benefits, limitations, and alternatives regarding  colonoscopy have been reviewed with the patient.  Questions have been answered.  All parties agreeable.   Jonathon Bellows, MD  12/17/2020, 9:48 AM

## 2020-12-17 NOTE — Anesthesia Procedure Notes (Signed)
Date/Time: 12/17/2020 9:55 AM Performed by: Johnna Acosta, CRNA Pre-anesthesia Checklist: Patient identified, Emergency Drugs available, Suction available, Patient being monitored and Timeout performed Patient Re-evaluated:Patient Re-evaluated prior to induction Oxygen Delivery Method: Nasal cannula Preoxygenation: Pre-oxygenation with 100% oxygen Induction Type: IV induction

## 2020-12-17 NOTE — Op Note (Signed)
[facility] Gastroenterology Patient Name: Victoria Bowers Procedure Date: 12/17/2020 9:45 AM MRN: [id number] Account #: [id number] Date of Birth: 19-Apr-1971 Admit Type: Outpatient Age: 50 Room: [facility] ENDO ROOM 4 Gender: Female Note Status: Finalized Procedure:             Colonoscopy Indications:           Follow-up of diverticulitis Providers:             Jonathon Bellows MD, MD Referring MD:          Jon Billings NP Medicines:             Monitored Anesthesia Care Complications:         No immediate complications. Procedure:             Pre-Anesthesia Assessment:                        - Prior to the procedure, a History and Physical was                         performed, and patient medications, allergies and                         sensitivities were reviewed. The patient's tolerance                         of previous anesthesia was reviewed.                        - The risks and benefits of the procedure and the                         sedation options and risks were discussed with the                         patient. All questions were answered and informed                         consent was obtained.                        - ASA Grade Assessment: II - A patient with mild                         systemic disease.                        After obtaining informed consent, the colonoscope was                         passed under direct vision. Throughout the procedure,                         the patient's blood pressure, pulse, and oxygen                         saturations were monitored continuously. The                         Colonoscope was introduced through the anus and  advanced to the the cecum, identified by the                         appendiceal orifice. The colonoscopy was performed                         with ease. The patient tolerated the procedure well.                         The quality of the bowel preparation was  excellent. Findings:      The perianal and digital rectal examinations were normal.      Multiple small-mouthed diverticula were found in the entire colon.      The exam was otherwise without abnormality on direct and retroflexion       views. Impression:            - Diverticulosis in the entire examined colon.                        - The examination was otherwise normal on direct and                         retroflexion views.                        - No specimens collected. Recommendation:        - Discharge patient to home (with escort).                        - Resume previous diet.                        - Continue present medications.                        - Repeat colonoscopy in 10 years for screening                         purposes. Procedure Code(s):     --- Professional ---                        [phone], Colonoscopy, flexible; diagnostic, including                         collection of specimen(s) by brushing or washing, when                         performed (separate procedure) Diagnosis Code(s):     --- Professional ---                        H85.27, Diverticulitis of large intestine without                         perforation or abscess without bleeding                        K57.30, Diverticulosis of large intestine without                         perforation or  abscess without bleeding CPT copyright 2019 American Medical Association. All rights reserved. The codes documented in this report are preliminary and upon coder review may  be revised to meet current compliance requirements. Jonathon Bellows, MD Jonathon Bellows MD, MD 12/17/2020 10:22:59 AM This report has been signed electronically. Number of Addenda: 0 Note Initiated On: 12/17/2020 9:45 AM Scope Withdrawal Time: 0 hours 11 minutes 19 seconds  Total Procedure Duration: 0 hours 16 minutes 42 seconds  Estimated Blood Loss:  Estimated blood loss: none.      [facility]

## 2020-12-17 NOTE — Anesthesia Postprocedure Evaluation (Signed)
Anesthesia Post Note  Patient: Victoria Bowers  Procedure(s) Performed: COLONOSCOPY WITH PROPOFOL (N/A )  Patient location during evaluation: Endoscopy Anesthesia Type: General Level of consciousness: awake and alert Pain management: pain level controlled Vital Signs Assessment: post-procedure vital signs reviewed and stable Respiratory status: spontaneous breathing, nonlabored ventilation, respiratory function stable and patient connected to nasal cannula oxygen Cardiovascular status: blood pressure returned to baseline and stable Postop Assessment: no apparent nausea or vomiting Anesthetic complications: no   No complications documented.   Last Vitals:  Vitals:   12/17/20 1055 12/17/20 1103  BP: 111/67 109/72  Pulse: 63 (!) 55  Resp: 18 (!) 21  Temp:    SpO2: 100% 100%    Last Pain:  Vitals:   12/17/20 1103  TempSrc:   PainSc: 0-No pain                 Martha Clan

## 2020-12-17 NOTE — Transfer of Care (Signed)
Immediate Anesthesia Transfer of Care Note  Patient: Victoria Bowers  Procedure(s) Performed: COLONOSCOPY WITH PROPOFOL (N/A )  Patient Location: PACU  Anesthesia Type:General  Level of Consciousness: drowsy  Airway & Oxygen Therapy: Patient Spontanous Breathing  Post-op Assessment: Report given to RN and Post -op Vital signs reviewed and stable  Post vital signs: Reviewed and stable  Last Vitals:  Vitals Value Taken Time  BP 93/76 12/17/20 1027  Temp    Pulse 75 12/17/20 1027  Resp 27 12/17/20 1027  SpO2 99 % 12/17/20 1027    Last Pain:  Vitals:   12/17/20 0901  TempSrc: Temporal  PainSc: 0-No pain         Complications: No complications documented.

## 2020-12-18 ENCOUNTER — Encounter: Payer: Self-pay | Admitting: Gastroenterology

## 2020-12-26 ENCOUNTER — Ambulatory Visit: Payer: 59 | Admitting: Family Medicine

## 2020-12-26 ENCOUNTER — Other Ambulatory Visit: Payer: Self-pay

## 2020-12-26 MED ORDER — ATENOLOL 25 MG PO TABS
25.0000 mg | ORAL_TABLET | Freq: Every day | ORAL | 1 refills | Status: DC
Start: 2020-12-26 — End: 2021-02-11

## 2021-01-16 ENCOUNTER — Other Ambulatory Visit: Payer: Self-pay

## 2021-01-16 DIAGNOSIS — I10 Essential (primary) hypertension: Secondary | ICD-10-CM

## 2021-01-16 MED ORDER — OLMESARTAN MEDOXOMIL 40 MG PO TABS
40.0000 mg | ORAL_TABLET | Freq: Every day | ORAL | 0 refills | Status: DC
Start: 2021-01-16 — End: 2021-02-11

## 2021-01-16 MED ORDER — AMLODIPINE BESYLATE 5 MG PO TABS: 5.0000 mg | ORAL_TABLET | Freq: Every day | ORAL | 0 refills | Status: DC

## 2021-01-16 NOTE — Telephone Encounter (Signed)
Called and left a detailed message letting patient know that she needs to schedule a follow up.

## 2021-01-23 ENCOUNTER — Encounter: Payer: Self-pay | Admitting: Nurse Practitioner

## 2021-01-23 ENCOUNTER — Other Ambulatory Visit: Payer: Self-pay

## 2021-01-23 ENCOUNTER — Telehealth (INDEPENDENT_AMBULATORY_CARE_PROVIDER_SITE_OTHER): Payer: 59 | Admitting: Nurse Practitioner

## 2021-01-23 DIAGNOSIS — Z9109 Other allergy status, other than to drugs and biological substances: Secondary | ICD-10-CM

## 2021-01-23 MED ORDER — MONTELUKAST SODIUM 10 MG PO TABS: 10.0000 mg | ORAL_TABLET | Freq: Every day | ORAL | 0 refills | Status: DC

## 2021-01-23 NOTE — Progress Notes (Signed)
There were no vitals taken for this visit.   Subjective:    Patient ID: Victoria Bowers, female    DOB: [birth year], 50 y.o.   MRN: [id number]  HPI: Victoria Bowers is a 50 y.o. female  Chief Complaint  Patient presents with  . Nasal Congestion    Sore throat cough and fatigue   UPPER RESPIRATORY TRACT INFECTION Worst symptom: runny nose x 7 days, sore throat, cough, and fatigue Fever: no Cough: yes Shortness of breath: no Wheezing: no Chest pain: yes, with cough Chest tightness: no Chest congestion: no Nasal congestion: yes Runny nose: yes Post nasal drip: yes Sneezing: yes Sore throat: yes Swollen glands: yes Sinus pressure: no Headache: no Face pain: yes Toothache: no Ear pain: no bilateral Ear pressure: no bilateral Eyes red/itching:no Eye drainage/crusting: no  Vomiting: no Rash: no Fatigue: yes Sick contacts: no Strep contacts: no  Context: stable Recurrent sinusitis: no Relief with OTC cold/cough medications: yes  Treatments attempted: anti-histamine    Relevant past medical, surgical, family and social history reviewed and updated as indicated. Interim medical history since our last visit reviewed. Allergies and medications reviewed and updated.  Review of Systems  Constitutional: Positive for fatigue. Negative for fever.  HENT: Positive for congestion, sinus pressure and sneezing. Negative for dental problem, ear pain, postnasal drip, rhinorrhea, sinus pain and sore throat.   Respiratory: Positive for cough. Negative for shortness of breath and wheezing.   Cardiovascular: Negative for chest pain.  Gastrointestinal: Negative for vomiting.  Skin: Negative for rash.  Neurological: Negative for headaches.    Per HPI unless specifically indicated above     Objective:    There were no vitals taken for this visit.  Wt Readings from Last 3 Encounters:  12/17/20 240 lb (108.9 kg)  11/26/20 247 lb 12.8 oz (112.4 kg)  11/14/20 247 lb 12.8 oz  (112.4 kg)    Physical Exam Vitals and nursing note reviewed.  HENT:     Head: Normocephalic.     Right Ear: Hearing normal.     Left Ear: Hearing normal.     Nose: Nose normal.  Eyes:     Pupils: Pupils are equal, round, and reactive to light.  Pulmonary:     Effort: Pulmonary effort is normal. No respiratory distress.  Neurological:     Mental Status: She is alert.  Psychiatric:        Mood and Affect: Mood normal.        Behavior: Behavior normal.        Thought Content: Thought content normal.        Judgment: Judgment normal.     Results for orders placed or performed during the hospital encounter of 12/13/20  SARS CORONAVIRUS 2 (TAT 6-24 HRS) Nasopharyngeal Nasopharyngeal Swab   Specimen: Nasopharyngeal Swab  Result Value Ref Range   SARS Coronavirus 2 NEGATIVE NEGATIVE      Assessment & Plan:   Problem List Items Addressed This Visit   None   Visit Diagnoses    Environmental allergies    -  Primary   Continue with OTC allergy medications and flonase. Begin singulair. Recommend nasal rinses. Return to clinic if symptoms worsen.       Follow up plan: Return if symptoms worsen or fail to improve.       This visit was completed via MyChart due to the restrictions of the COVID-19 pandemic. All issues as above were discussed and addressed. Physical exam was done as above  through visual confirmation on MyChart. If it was felt that the patient should be evaluated in the office, they were directed there. The patient verbally consented to this visit. 1. Location of the patient: Home 2. Location of the provider: Office 3. Those involved with this call:  ? Provider: Jon Billings, NP ? CMA: Tiffany Reel, CMA ? Front Desk/Registration: Jill Side 4. Time spent on call: 15 minutes with patient face to face via video conference. More than 50% of this time was spent in counseling and coordination of care. 20 minutes total spent in review of patient's record and  preparation of their chart.

## 2021-02-11 ENCOUNTER — Encounter: Payer: Self-pay | Admitting: Nurse Practitioner

## 2021-02-11 ENCOUNTER — Other Ambulatory Visit: Payer: Self-pay

## 2021-02-11 ENCOUNTER — Ambulatory Visit (INDEPENDENT_AMBULATORY_CARE_PROVIDER_SITE_OTHER): Payer: 59 | Admitting: Nurse Practitioner

## 2021-02-11 VITALS — BP 121/79 | HR 70 | Temp 98.3°F | Wt 240.0 lb

## 2021-02-11 DIAGNOSIS — E7800 Pure hypercholesterolemia, unspecified: Secondary | ICD-10-CM

## 2021-02-11 DIAGNOSIS — I10 Essential (primary) hypertension: Secondary | ICD-10-CM | POA: Diagnosis not present

## 2021-02-11 DIAGNOSIS — Z1231 Encounter for screening mammogram for malignant neoplasm of breast: Secondary | ICD-10-CM

## 2021-02-11 DIAGNOSIS — Z0000 Encounter for general adult medical examination without abnormal findings: Secondary | ICD-10-CM | POA: Diagnosis not present

## 2021-02-11 DIAGNOSIS — E559 Vitamin D deficiency, unspecified: Secondary | ICD-10-CM | POA: Diagnosis not present

## 2021-02-11 LAB — URINALYSIS, ROUTINE W REFLEX MICROSCOPIC
Bilirubin, UA: NEGATIVE
Glucose, UA: NEGATIVE
Ketones, UA: NEGATIVE
Leukocytes,UA: NEGATIVE
Nitrite, UA: NEGATIVE
Protein,UA: NEGATIVE
Specific Gravity, UA: 1.025 (ref 1.005–1.030)
Urobilinogen, Ur: 0.2 mg/dL (ref 0.2–1.0)
pH, UA: 6 (ref 5.0–7.5)

## 2021-02-11 LAB — MICROSCOPIC EXAMINATION: WBC, UA: NONE SEEN /hpf (ref 0–5)

## 2021-02-11 MED ORDER — AMLODIPINE BESYLATE 5 MG PO TABS: 5.0000 mg | ORAL_TABLET | Freq: Every day | ORAL | 1 refills | Status: DC

## 2021-02-11 MED ORDER — ATENOLOL 25 MG PO TABS: 25.0000 mg | ORAL_TABLET | Freq: Every day | ORAL | 1 refills | Status: DC

## 2021-02-11 MED ORDER — OLMESARTAN MEDOXOMIL 40 MG PO TABS: 40.0000 mg | ORAL_TABLET | Freq: Every day | ORAL | 1 refills | Status: DC

## 2021-02-11 NOTE — Patient Instructions (Signed)
Health Maintenance, Female Adopting a healthy lifestyle and getting preventive care are important in promoting health and wellness. Ask your health care provider about:  The right schedule for you to have regular tests and exams.  Things you can do on your own to prevent diseases and keep yourself healthy. What should I know about diet, weight, and exercise? Eat a healthy diet  Eat a diet that includes plenty of vegetables, fruits, low-fat dairy products, and lean protein.  Do not eat a lot of foods that are high in solid fats, added sugars, or sodium.   Maintain a healthy weight Body mass index (BMI) is used to identify weight problems. It estimates body fat based on height and weight. Your health care provider can help determine your BMI and help you achieve or maintain a healthy weight. Get regular exercise Get regular exercise. This is one of the most important things you can do for your health. Most adults should:  Exercise for at least 150 minutes each week. The exercise should increase your heart rate and make you sweat (moderate-intensity exercise).  Do strengthening exercises at least twice a week. This is in addition to the moderate-intensity exercise.  Spend less time sitting. Even light physical activity can be beneficial. Watch cholesterol and blood lipids Have your blood tested for lipids and cholesterol at 52 years of age, then have this test every 5 years. Have your cholesterol levels checked more often if:  Your lipid or cholesterol levels are high.  You are older than 52 years of age.  You are at high risk for heart disease. What should I know about cancer screening? Depending on your health history and family history, you may need to have cancer screening at various ages. This may include screening for:  Breast cancer.  Cervical cancer.  Colorectal cancer.  Skin cancer.  Lung cancer. What should I know about heart disease, diabetes, and high blood  pressure? Blood pressure and heart disease  High blood pressure causes heart disease and increases the risk of stroke. This is more likely to develop in people who have high blood pressure readings, are of African descent, or are overweight.  Have your blood pressure checked: ? Every 3-5 years if you are 18-39 years of age. ? Every year if you are 40 years old or older. Diabetes Have regular diabetes screenings. This checks your fasting blood sugar level. Have the screening done:  Once every three years after age 40 if you are at a normal weight and have a low risk for diabetes.  More often and at a younger age if you are overweight or have a high risk for diabetes. What should I know about preventing infection? Hepatitis B If you have a higher risk for hepatitis B, you should be screened for this virus. Talk with your health care provider to find out if you are at risk for hepatitis B infection. Hepatitis C Testing is recommended for:  Everyone born from 1945 through 1965.  Anyone with known risk factors for hepatitis C. Sexually transmitted infections (STIs)  Get screened for STIs, including gonorrhea and chlamydia, if: ? You are sexually active and are younger than 52 years of age. ? You are older than 52 years of age and your health care provider tells you that you are at risk for this type of infection. ? Your sexual activity has changed since you were last screened, and you are at increased risk for chlamydia or gonorrhea. Ask your health care provider   if you are at risk.  Ask your health care provider about whether you are at high risk for HIV. Your health care provider may recommend a prescription medicine to help prevent HIV infection. If you choose to take medicine to prevent HIV, you should first get tested for HIV. You should then be tested every 3 months for as long as you are taking the medicine. Pregnancy  If you are about to stop having your period (premenopausal) and  you may become pregnant, seek counseling before you get pregnant.  Take 400 to 800 micrograms (mcg) of folic acid every day if you become pregnant.  Ask for birth control (contraception) if you want to prevent pregnancy. Osteoporosis and menopause Osteoporosis is a disease in which the bones lose minerals and strength with aging. This can result in bone fractures. If you are 65 years old or older, or if you are at risk for osteoporosis and fractures, ask your health care provider if you should:  Be screened for bone loss.  Take a calcium or vitamin D supplement to lower your risk of fractures.  Be given hormone replacement therapy (HRT) to treat symptoms of menopause. Follow these instructions at home: Lifestyle  Do not use any products that contain nicotine or tobacco, such as cigarettes, e-cigarettes, and chewing tobacco. If you need help quitting, ask your health care provider.  Do not use street drugs.  Do not share needles.  Ask your health care provider for help if you need support or information about quitting drugs. Alcohol use  Do not drink alcohol if: ? Your health care provider tells you not to drink. ? You are pregnant, may be pregnant, or are planning to become pregnant.  If you drink alcohol: ? Limit how much you use to 0-1 drink a day. ? Limit intake if you are breastfeeding.  Be aware of how much alcohol is in your drink. In the [place] one drink equals one 12 oz bottle of beer (355 mL), one 5 oz glass of wine (148 mL), or one 1 oz glass of hard liquor (44 mL). General instructions  Schedule regular health, dental, and eye exams.  Stay current with your vaccines.  Tell your health care provider if: ? You often feel depressed. ? You have ever been abused or do not feel safe at home. Summary  Adopting a healthy lifestyle and getting preventive care are important in promoting health and wellness.  Follow your health care provider's instructions about healthy  diet, exercising, and getting tested or screened for diseases.  Follow your health care provider's instructions on monitoring your cholesterol and blood pressure. This information is not intended to replace advice given to you by your health care provider. Make sure you discuss any questions you have with your health care provider. Document Revised: 09/08/2018 Document Reviewed: 09/08/2018 Elsevier Patient Education  2021 Elsevier Inc.  

## 2021-02-11 NOTE — Assessment & Plan Note (Signed)
Chronic, stable on amlodipine 5 mg, Benicar 40 mg daily, and atenolol 25 mg daily.  BP at goal today in clinic.  Encouraged patient to monitor BP at home and bring log to next appointment.  Goal <130/80.  Labs ordered today. Continue current regimen for now, refills sent into pharmacy.

## 2021-02-11 NOTE — Assessment & Plan Note (Signed)
Weight loss encouraged. Increase exercise as tolerated.

## 2021-02-11 NOTE — Assessment & Plan Note (Signed)
Labs ordered today.  Will make recommendations based on lab results. ?

## 2021-02-11 NOTE — Progress Notes (Signed)
BP 121/79   Pulse 70   Temp 98.3 F (36.8 C)   Wt 240 lb (108.9 kg) Comment: Patient reported  SpO2 98%   BMI 43.90 kg/m    Subjective:    Patient ID: Victoria Bowers, female    DOB: 1971-09-12, 50 y.o.   MRN: [id number]  HPI: Victoria Bowers is a 50 y.o. female presenting on 02/11/2021 for comprehensive medical examination. Current medical complaints include:none  She currently lives with: Husband Menopausal Symptoms: Not since having IUD placed.  HYPERTENSION / HYPERLIPIDEMIA Satisfied with current treatment? no Duration of hypertension: years BP monitoring frequency: not checking BP range:  BP medication side effects: no Past BP meds: amlodipine, atenolol and olmesartan (benicar) Duration of hyperlipidemia: years Cholesterol medication side effects: no Cholesterol supplements:none Past cholesterol medications: none Medication compliance: excellent compliance Aspirin: no Recent stressors: no Recurrent headaches: no Visual changes: no Palpitations: no Dyspnea: no Chest pain: no Lower extremity edema: no Dizzy/lightheaded: no  Depression Screen done today and results listed below:  Depression screen [facility] 2/9 11/07/2020 11/24/2019 11/22/2018 03/18/2018 11/17/2017  Decreased Interest 0 0 0 0 0  Down, Depressed, Hopeless 0 0 0 0 0  PHQ - 2 Score 0 0 0 0 0  Altered sleeping - 0 - - -  Tired, decreased energy - 0 - - -  Change in appetite - 2 - - -  Feeling bad or failure about yourself  - 0 - - -  Trouble concentrating - 0 - - -  Moving slowly or fidgety/restless - 0 - - -  Suicidal thoughts - 0 - - -  PHQ-9 Score - 2 - - -    The patient does not have a history of falls. I did complete a risk assessment for falls. A plan of care for falls was documented.   Past Medical History:  Past Medical History:  Diagnosis Date  . Allergy   . Bertolotti's syndrome   . Complication of anesthesia   . Fatty liver   . Fibroid   . GERD (gastroesophageal reflux disease)     OCC  . Hypertension   . Ovarian cyst   . PONV (postoperative nausea and vomiting)    NAUSEATED    Surgical History:  Past Surgical History:  Procedure Laterality Date  . CERVICAL POLYPECTOMY    . CHOLECYSTECTOMY    . COLONOSCOPY WITH PROPOFOL N/A 11/26/2020   Procedure: COLONOSCOPY WITH PROPOFOL;  Surgeon: Jonathon Bellows, MD;  Location: [facility] ENDOSCOPY;  Service: Gastroenterology;  Laterality: N/A;  . COLONOSCOPY WITH PROPOFOL N/A 12/17/2020   Procedure: COLONOSCOPY WITH PROPOFOL;  Surgeon: Jonathon Bellows, MD;  Location: [facility] ENDOSCOPY;  Service: Gastroenterology;  Laterality: N/A;  . HYSTEROSCOPY WITH D & C N/A 03/28/2019   Procedure: DILATATION AND CURETTAGE /HYSTEROSCOPY;  Surgeon: Harlin Heys, MD;  Location: [facility] ORS;  Service: Gynecology;  Laterality: N/A;  . INTRAUTERINE DEVICE (IUD) INSERTION N/A 03/28/2019   Procedure: INTRAUTERINE DEVICE (IUD) INSERTION;  Surgeon: Harlin Heys, MD;  Location: [facility] ORS;  Service: Gynecology;  Laterality: N/A;  . NASAL SINUS SURGERY  X2  . SPINE SURGERY  ruptured disc   LUMBAR  . TONSILLECTOMY    . TONSILLECTOMY AND ADENOIDECTOMY  1985    Medications:  Current Outpatient Medications on File Prior to Visit  Medication Sig  . fluticasone (FLONASE) 50 MCG/ACT nasal spray Place 2 sprays into both nostrils every morning.  Marland Kitchen levonorgestrel (MIRENA) 20 MCG/24HR IUD 1 each by Intrauterine route once.  Marland Kitchen  montelukast (SINGULAIR) 10 MG tablet Take 1 tablet (10 mg total) by mouth at bedtime.   No current facility-administered medications on file prior to visit.    Allergies:  Allergies  Allergen Reactions  . Elemental Sulfur Hives  . Sulfa Antibiotics Hives    Social History:  Social History   Socioeconomic History  . Marital status: Married    Spouse name: Not on file  . Number of children: Not on file  . Years of education: Not on file  . Highest education level: Not on file  Occupational History  . Not on file  Tobacco Use   . Smoking status: Never Smoker  . Smokeless tobacco: Never Used  Vaping Use  . Vaping Use: Never used  Substance and Sexual Activity  . Alcohol use: No    Alcohol/week: 0.0 standard drinks  . Drug use: No  . Sexual activity: Yes  Other Topics Concern  . Not on file  Social History Narrative  . Not on file   Social Determinants of Health   Financial Resource Strain: Not on file  Food Insecurity: Not on file  Transportation Needs: Not on file  Physical Activity: Not on file  Stress: Not on file  Social Connections: Not on file  Intimate Partner Violence: Not on file   Social History   Tobacco Use  Smoking Status Never Smoker  Smokeless Tobacco Never Used   Social History   Substance and Sexual Activity  Alcohol Use No  . Alcohol/week: 0.0 standard drinks    Family History:  Family History  Problem Relation Age of Onset  . Hypertension Father   . Hypertension Brother   . Hypertension Paternal Grandmother   . Hypertension Brother   . Heart disease Mother     Past medical history, surgical history, medications, allergies, family history and social history reviewed with patient today and changes made to appropriate areas of the chart.   Review of Systems  Eyes: Negative for blurred vision and double vision.  Respiratory: Negative for shortness of breath.   Cardiovascular: Negative for chest pain, palpitations and leg swelling.  Neurological: Negative for dizziness and headaches.   All other ROS negative except what is listed above and in the HPI.      Objective:    BP 121/79   Pulse 70   Temp 98.3 F (36.8 C)   Wt 240 lb (108.9 kg) Comment: Patient reported  SpO2 98%   BMI 43.90 kg/m   Wt Readings from Last 3 Encounters:  02/11/21 240 lb (108.9 kg)  12/17/20 240 lb (108.9 kg)  11/26/20 247 lb 12.8 oz (112.4 kg)    Physical Exam Vitals and nursing note reviewed.  Constitutional:      General: She is awake. She is not in acute distress.     Appearance: She is well-developed. She is obese. She is not ill-appearing.  HENT:     Head: Normocephalic and atraumatic.     Right Ear: Hearing, tympanic membrane, ear canal and external ear normal. No drainage.     Left Ear: Hearing, tympanic membrane, ear canal and external ear normal. No drainage.     Nose: Nose normal.     Right Sinus: No maxillary sinus tenderness or frontal sinus tenderness.     Left Sinus: No maxillary sinus tenderness or frontal sinus tenderness.     Mouth/Throat:     Mouth: Mucous membranes are moist.     Pharynx: Oropharynx is clear. Uvula midline. No pharyngeal swelling, oropharyngeal  exudate or posterior oropharyngeal erythema.  Eyes:     General: Lids are normal.        Right eye: No discharge.        Left eye: No discharge.     Extraocular Movements: Extraocular movements intact.     Conjunctiva/sclera: Conjunctivae normal.     Pupils: Pupils are equal, round, and reactive to light.     Visual Fields: Right eye visual fields normal and left eye visual fields normal.  Neck:     Thyroid: No thyromegaly.     Vascular: No carotid bruit.     Trachea: Trachea normal.  Cardiovascular:     Rate and Rhythm: Normal rate and regular rhythm.     Heart sounds: Normal heart sounds. No murmur heard. No gallop.   Pulmonary:     Effort: Pulmonary effort is normal. No accessory muscle usage or respiratory distress.     Breath sounds: Normal breath sounds.  Chest:  Breasts:     Right: Normal. No axillary adenopathy or supraclavicular adenopathy.     Left: Normal. No axillary adenopathy or supraclavicular adenopathy.    Abdominal:     General: Bowel sounds are normal.     Palpations: Abdomen is soft. There is no hepatomegaly or splenomegaly.     Tenderness: There is no abdominal tenderness.  Musculoskeletal:        General: Normal range of motion.     Cervical back: Normal range of motion and neck supple.     Right lower leg: No edema.     Left lower leg: No  edema.  Lymphadenopathy:     Head:     Right side of head: No submental, submandibular, tonsillar, preauricular or posterior auricular adenopathy.     Left side of head: No submental, submandibular, tonsillar, preauricular or posterior auricular adenopathy.     Cervical: No cervical adenopathy.     Upper Body:     Right upper body: No supraclavicular, axillary or pectoral adenopathy.     Left upper body: No supraclavicular, axillary or pectoral adenopathy.  Skin:    General: Skin is warm and dry.     Capillary Refill: Capillary refill takes less than 2 seconds.     Findings: No rash.  Neurological:     Mental Status: She is alert and oriented to person, place, and time.     Cranial Nerves: Cranial nerves are intact.     Gait: Gait is intact.     Deep Tendon Reflexes: Reflexes are normal and symmetric.     Reflex Scores:      Brachioradialis reflexes are 2+ on the right side and 2+ on the left side.      Patellar reflexes are 2+ on the right side and 2+ on the left side. Psychiatric:        Attention and Perception: Attention normal.        Mood and Affect: Mood normal.        Speech: Speech normal.        Behavior: Behavior normal. Behavior is cooperative.        Thought Content: Thought content normal.        Judgment: Judgment normal.     Results for orders placed or performed during the hospital encounter of 12/13/20  SARS CORONAVIRUS 2 (TAT 6-24 HRS) Nasopharyngeal Nasopharyngeal Swab   Specimen: Nasopharyngeal Swab  Result Value Ref Range   SARS Coronavirus 2 NEGATIVE NEGATIVE      Assessment & Plan:  Problem List Items Addressed This Visit      Cardiovascular and Mediastinum   Essential hypertension    Chronic, stable on amlodipine 5 mg, Benicar 40 mg daily, and atenolol 25 mg daily.  BP at goal today in clinic.  Encouraged patient to monitor BP at home and bring log to next appointment.  Goal <130/80.  Labs ordered today. Continue current regimen for now, refills  sent into pharmacy.      Relevant Medications   amLODipine (NORVASC) 5 MG tablet   atenolol (TENORMIN) 25 MG tablet   olmesartan (BENICAR) 40 MG tablet   Other Relevant Orders   CBC with Differential/Platelet   Comprehensive metabolic panel   Urinalysis, Routine w reflex microscopic     Other   Vitamin D deficiency    Labs ordered today.  Will make recommendations based on lab results.       Relevant Orders   Vitamin D (25 hydroxy)   Obesity, Class III, BMI 40-49.9 (morbid obesity) ([place])    Weight loss encouraged. Increase exercise as tolerated.      Relevant Orders   TSH   Hypercholesterolemia    Labs ordered today.  Will make recommendations based on lab results.       Relevant Medications   amLODipine (NORVASC) 5 MG tablet   atenolol (TENORMIN) 25 MG tablet   olmesartan (BENICAR) 40 MG tablet   Other Relevant Orders   Lipid panel    Other Visit Diagnoses    Annual physical exam    -  Primary   Health maintenance reviewed during visit.  Labs ordered today.  Mammogram ordered today.  Vaccines up to date.   Relevant Orders   TSH   Encounter for screening mammogram for malignant neoplasm of breast       Relevant Orders   MM Digital Screening       Follow up plan: Return in about 6 months (around 08/14/2021) for HTN, HLD, DM2 FU.   LABORATORY TESTING:  - Pap smear: up to date  IMMUNIZATIONS:   - Tdap: Tetanus vaccination status reviewed: last tetanus booster within 10 years. - Influenza: Up to date - Pneumovax: Not applicable - Prevnar: Not applicable - HPV: Not applicable - Zostavax vaccine: Not applicable  SCREENING: -Mammogram: Ordered today  - Colonoscopy: Up to date  - Bone Density: Not applicable  -Hearing Test: Not applicable  -Spirometry: Not applicable   PATIENT COUNSELING:   Advised to take 1 mg of folate supplement per day if capable of pregnancy.   Sexuality: Discussed sexually transmitted diseases, partner selection, use of condoms,  avoidance of unintended pregnancy  and contraceptive alternatives.   Advised to avoid cigarette smoking.  I discussed with the patient that most people either abstain from alcohol or drink within safe limits (<=14/week and <=4 drinks/occasion for males, <=7/weeks and <= 3 drinks/occasion for females) and that the risk for alcohol disorders and other health effects rises proportionally with the number of drinks per week and how often a drinker exceeds daily limits.  Discussed cessation/primary prevention of drug use and availability of treatment for abuse.   Diet: Encouraged to adjust caloric intake to maintain  or achieve ideal body weight, to reduce intake of dietary saturated fat and total fat, to limit sodium intake by avoiding high sodium foods and not adding table salt, and to maintain adequate dietary potassium and calcium preferably from fresh fruits, vegetables, and low-fat dairy products.    stressed the importance of regular exercise  Injury  prevention: Discussed safety belts, safety helmets, smoke detector, smoking near bedding or upholstery.   Dental health: Discussed importance of regular tooth brushing, flossing, and dental visits.    NEXT PREVENTATIVE PHYSICAL DUE IN 1 YEAR. Return in about 6 months (around 08/14/2021) for HTN, HLD, DM2 FU.

## 2021-02-12 LAB — COMPREHENSIVE METABOLIC PANEL
ALT: 16 IU/L (ref 0–32)
AST: 17 IU/L (ref 0–40)
Albumin/Globulin Ratio: 1.5 (ref 1.2–2.2)
Albumin: 4.3 g/dL (ref 3.8–4.9)
Alkaline Phosphatase: 112 IU/L (ref 44–121)
BUN/Creatinine Ratio: 15 (ref 9–23)
BUN: 13 mg/dL (ref 6–24)
Bilirubin Total: 0.3 mg/dL (ref 0.0–1.2)
CO2: 18 mmol/L — ABNORMAL LOW (ref 20–29)
Calcium: 9.2 mg/dL (ref 8.7–10.2)
Chloride: 100 mmol/L (ref 96–106)
Creatinine, Ser: 0.84 mg/dL (ref 0.57–1.00)
Globulin, Total: 2.8 g/dL (ref 1.5–4.5)
Glucose: 116 mg/dL — ABNORMAL HIGH (ref 65–99)
Potassium: 4.8 mmol/L (ref 3.5–5.2)
Sodium: 137 mmol/L (ref 134–144)
Total Protein: 7.1 g/dL (ref 6.0–8.5)
eGFR: 84 mL/min/{1.73_m2} (ref >59)

## 2021-02-12 LAB — CBC WITH DIFFERENTIAL/PLATELET
Basophils Absolute: 0.1 10*3/uL (ref 0.0–0.2)
Basos: 1 %
EOS (ABSOLUTE): 0.4 10*3/uL (ref 0.0–0.4)
Eos: 4 %
Hematocrit: 41.4 % (ref 34.0–46.6)
Hemoglobin: 13.8 g/dL (ref 11.1–15.9)
Immature Grans (Abs): 0.1 10*3/uL (ref 0.0–0.1)
Immature Granulocytes: 1 %
Lymphocytes Absolute: 2.3 10*3/uL (ref 0.7–3.1)
Lymphs: 20 %
MCH: 29.2 pg (ref 26.6–33.0)
MCHC: 33.3 g/dL (ref 31.5–35.7)
MCV: 88 fL (ref 79–97)
Monocytes Absolute: 0.7 10*3/uL (ref 0.1–0.9)
Monocytes: 6 %
Neutrophils Absolute: 8 10*3/uL — ABNORMAL HIGH (ref 1.4–7.0)
Neutrophils: 68 %
Platelets: 408 10*3/uL (ref 150–450)
RBC: 4.72 x10E6/uL (ref 3.77–5.28)
RDW: 12.7 % (ref 11.7–15.4)
WBC: 11.4 10*3/uL — ABNORMAL HIGH (ref 3.4–10.8)

## 2021-02-12 LAB — LIPID PANEL
Chol/HDL Ratio: 4.4 ratio (ref 0.0–4.4)
Cholesterol, Total: 184 mg/dL (ref 100–199)
HDL: 42 mg/dL (ref >39)
LDL Chol Calc (NIH): 110 mg/dL — ABNORMAL HIGH (ref 0–99)
Triglycerides: 185 mg/dL — ABNORMAL HIGH (ref 0–149)
VLDL Cholesterol Cal: 32 mg/dL (ref 5–40)

## 2021-02-12 LAB — TSH: TSH: 3.67 u[IU]/mL (ref 0.450–4.500)

## 2021-02-12 LAB — VITAMIN D 25 HYDROXY (VIT D DEFICIENCY, FRACTURES): Vit D, 25-Hydroxy: 14.9 ng/mL — ABNORMAL LOW (ref 30.0–100.0)

## 2021-02-12 MED ORDER — NITROFURANTOIN MONOHYD MACRO 100 MG PO CAPS: 100.0000 mg | ORAL_CAPSULE | Freq: Two times a day (BID) | ORAL | 0 refills | Status: DC

## 2021-02-12 NOTE — Progress Notes (Signed)
Hi Victoria Bowers, it was a pleasure meeting you yesterday.  Your urine had some bacteria in it and your white blood cell count was elevated showing an infection.  I went ahead and sent an antibiotic to the pharmacy for you to treat a UTI. Otherwise, Your liver, kidneys, and electrolytes look good. Your cholesterol is elevated from last year.  Recommend continuing a low fat diet and exercise as tolerated.  Please let me know if you have any questions.

## 2021-02-12 NOTE — Addendum Note (Signed)
Addended by: Jon Billings on: 02/12/2021 09:38 AM   Modules accepted: Orders

## 2021-02-12 NOTE — Progress Notes (Signed)
Hi Victoria Bowers, it was a pleasure meeting you yesterday.  Your urine results looks good.  Please let me know if you have any questions.

## 2021-02-15 ENCOUNTER — Other Ambulatory Visit: Payer: Self-pay | Admitting: Nurse Practitioner

## 2021-03-20 ENCOUNTER — Other Ambulatory Visit: Payer: Self-pay

## 2021-03-20 ENCOUNTER — Encounter: Payer: Self-pay | Admitting: Nurse Practitioner

## 2021-03-20 ENCOUNTER — Ambulatory Visit (INDEPENDENT_AMBULATORY_CARE_PROVIDER_SITE_OTHER): Payer: 59 | Admitting: Nurse Practitioner

## 2021-03-20 VITALS — BP 139/80 | HR 63 | Temp 99.1°F | Wt 240.0 lb

## 2021-03-20 DIAGNOSIS — J209 Acute bronchitis, unspecified: Secondary | ICD-10-CM

## 2021-03-20 MED ORDER — HYDROCOD POLST-CPM POLST ER 10-8 MG/5ML PO SUER: 5.0000 mL | Freq: Two times a day (BID) | ORAL | 0 refills | Status: DC | PRN

## 2021-03-20 MED ORDER — AMOXICILLIN 500 MG PO CAPS: 1000.0000 mg | ORAL_CAPSULE | Freq: Two times a day (BID) | ORAL | 0 refills | Status: AC

## 2021-03-20 MED ORDER — BENZONATATE 200 MG PO CAPS: 200.0000 mg | ORAL_CAPSULE | Freq: Two times a day (BID) | ORAL | 0 refills | Status: DC | PRN

## 2021-03-20 MED ORDER — METHYLPREDNISOLONE 4 MG PO TBPK: ORAL_TABLET | ORAL | 0 refills | Status: DC

## 2021-03-20 NOTE — Progress Notes (Signed)
BP 139/80   Pulse 63   Temp 99.1 F (37.3 C)   Wt 240 lb (108.9 kg)   SpO2 97%   BMI 43.90 kg/m    Subjective:    Patient ID: Victoria Bowers, female    DOB: February 27, 1971, 50 y.o.   MRN: [id number]  HPI: Victoria Bowers is a 50 y.o. female  Chief Complaint  Patient presents with   Cough    X 2 weeks, lost of congestion   UPPER RESPIRATORY TRACT INFECTION Worst symptom: cough x 2 weeks Fever: no Cough: yes Shortness of breath: no Wheezing: yes Chest pain: no Chest tightness: no Chest congestion: yes Nasal congestion: yes Runny nose: no Post nasal drip: yes Sneezing: yes Sore throat: yes Swollen glands: yes Sinus pressure: no Headache: yes Face pain: no Toothache: no Ear pain: no bilateral Ear pressure: yes bilateral Eyes red/itching:no Eye drainage/crusting: no  Vomiting: no Rash: no Fatigue: yes Sick contacts: no Strep contacts: no  Context: worse Recurrent sinusitis: no Relief with OTC cold/cough medications:  a little bit   Treatments attempted:  a lot of different things    Relevant past medical, surgical, family and social history reviewed and updated as indicated. Interim medical history since our last visit reviewed. Allergies and medications reviewed and updated.  Review of Systems  Constitutional:  Positive for fatigue. Negative for fever.  HENT:  Positive for congestion, postnasal drip, sneezing and sore throat. Negative for dental problem, ear pain, rhinorrhea, sinus pressure and sinus pain.   Respiratory:  Positive for cough. Negative for shortness of breath and wheezing.   Cardiovascular:  Negative for chest pain.  Gastrointestinal:  Negative for vomiting.  Skin:  Negative for rash.  Neurological:  Positive for headaches.   Per HPI unless specifically indicated above     Objective:    BP 139/80   Pulse 63   Temp 99.1 F (37.3 C)   Wt 240 lb (108.9 kg)   SpO2 97%   BMI 43.90 kg/m   Wt Readings from Last 3 Encounters:   03/20/21 240 lb (108.9 kg)  02/11/21 240 lb (108.9 kg)  12/17/20 240 lb (108.9 kg)    Physical Exam Vitals and nursing note reviewed.  Constitutional:      General: She is not in acute distress.    Appearance: Normal appearance. She is normal weight. She is not ill-appearing, toxic-appearing or diaphoretic.  HENT:     Head: Normocephalic.     Right Ear: Tympanic membrane, ear canal and external ear normal.     Left Ear: Tympanic membrane, ear canal and external ear normal.     Nose: Congestion present.     Mouth/Throat:     Mouth: Mucous membranes are moist.     Pharynx: Oropharynx is clear. Posterior oropharyngeal erythema present. No oropharyngeal exudate.  Eyes:     General:        Right eye: No discharge.        Left eye: No discharge.     Extraocular Movements: Extraocular movements intact.     Conjunctiva/sclera: Conjunctivae normal.     Pupils: Pupils are equal, round, and reactive to light.  Cardiovascular:     Rate and Rhythm: Normal rate and regular rhythm.     Heart sounds: No murmur heard. Pulmonary:     Effort: Pulmonary effort is normal. No respiratory distress.     Breath sounds: Normal breath sounds. No wheezing or rales.  Musculoskeletal:     Cervical back:  Normal range of motion and neck supple.  Skin:    General: Skin is warm and dry.     Capillary Refill: Capillary refill takes less than 2 seconds.  Neurological:     General: No focal deficit present.     Mental Status: She is alert and oriented to person, place, and time. Mental status is at baseline.  Psychiatric:        Mood and Affect: Mood normal.        Behavior: Behavior normal.        Thought Content: Thought content normal.        Judgment: Judgment normal.    Results for orders placed or performed in visit on 02/11/21  Microscopic Examination   Urine  Result Value Ref Range   WBC, UA None seen 0 - 5 /hpf   RBC 0-2 0 - 2 /hpf   Epithelial Cells (non renal) 0-10 0 - 10 /hpf   Mucus, UA  Present (A) Not Estab.   Bacteria, UA Few (A) None seen/Few  CBC with Differential/Platelet  Result Value Ref Range   WBC 11.4 (H) 3.4 - 10.8 x10E3/uL   RBC 4.72 3.77 - 5.28 x10E6/uL   Hemoglobin 13.8 11.1 - 15.9 g/dL   Hematocrit 41.4 34.0 - 46.6 %   MCV 88 79 - 97 fL   MCH 29.2 26.6 - 33.0 pg   MCHC 33.3 31.5 - 35.7 g/dL   RDW 12.7 11.7 - 15.4 %   Platelets 408 150 - 450 x10E3/uL   Neutrophils 68 Not Estab. %   Lymphs 20 Not Estab. %   Monocytes 6 Not Estab. %   Eos 4 Not Estab. %   Basos 1 Not Estab. %   Neutrophils Absolute 8.0 (H) 1.4 - 7.0 x10E3/uL   Lymphocytes Absolute 2.3 0.7 - 3.1 x10E3/uL   Monocytes Absolute 0.7 0.1 - 0.9 x10E3/uL   EOS (ABSOLUTE) 0.4 0.0 - 0.4 x10E3/uL   Basophils Absolute 0.1 0.0 - 0.2 x10E3/uL   Immature Granulocytes 1 Not Estab. %   Immature Grans (Abs) 0.1 0.0 - 0.1 x10E3/uL  Comprehensive metabolic panel  Result Value Ref Range   Glucose 116 (H) 65 - 99 mg/dL   BUN 13 6 - 24 mg/dL   Creatinine, Ser 0.84 0.57 - 1.00 mg/dL   eGFR 84 >59 mL/min/1.73   BUN/Creatinine Ratio 15 9 - 23   Sodium 137 134 - 144 mmol/L   Potassium 4.8 3.5 - 5.2 mmol/L   Chloride 100 96 - 106 mmol/L   CO2 18 (L) 20 - 29 mmol/L   Calcium 9.2 8.7 - 10.2 mg/dL   Total Protein 7.1 6.0 - 8.5 g/dL   Albumin 4.3 3.8 - 4.9 g/dL   Globulin, Total 2.8 1.5 - 4.5 g/dL   Albumin/Globulin Ratio 1.5 1.2 - 2.2   Bilirubin Total 0.3 0.0 - 1.2 mg/dL   Alkaline Phosphatase 112 44 - 121 IU/L   AST 17 0 - 40 IU/L   ALT 16 0 - 32 IU/L  Lipid panel  Result Value Ref Range   Cholesterol, Total 184 100 - 199 mg/dL   Triglycerides 185 (H) 0 - 149 mg/dL   HDL 42 >39 mg/dL   VLDL Cholesterol Cal 32 5 - 40 mg/dL   LDL Chol Calc (NIH) 110 (H) 0 - 99 mg/dL   Chol/HDL Ratio 4.4 0.0 - 4.4 ratio  TSH  Result Value Ref Range   TSH 3.670 0.450 - 4.500 uIU/mL  Urinalysis, Routine w  reflex microscopic  Result Value Ref Range   Specific Gravity, UA 1.025 1.005 - 1.030   pH, UA 6.0 5.0 - 7.5    Color, UA Yellow Yellow   Appearance Ur Clear Clear   Leukocytes,UA Negative Negative   Protein,UA Negative Negative/Trace   Glucose, UA Negative Negative   Ketones, UA Negative Negative   RBC, UA Trace (A) Negative   Bilirubin, UA Negative Negative   Urobilinogen, Ur 0.2 0.2 - 1.0 mg/dL   Nitrite, UA Negative Negative   Microscopic Examination See below:   Vitamin D (25 hydroxy)  Result Value Ref Range   Vit D, 25-Hydroxy 14.9 (L) 30.0 - 100.0 ng/mL      Assessment & Plan:   Problem List Items Addressed This Visit   None Visit Diagnoses     Acute bronchitis, unspecified organism    -  Primary   Patient treated with Amoxicillin and steroids due to symptoms worsening after two weeks. Discussed proper use and caution with Tussonex. Reviewed ER precautions        Follow up plan: Return if symptoms worsen or fail to improve.

## 2021-04-24 ENCOUNTER — Other Ambulatory Visit: Payer: Self-pay | Admitting: Nurse Practitioner

## 2021-04-24 NOTE — Telephone Encounter (Signed)
Please advise 

## 2021-04-24 NOTE — Telephone Encounter (Signed)
   Notes to clinic:  The original prescription was discontinued on 03/20/2021 by Reel, Tiffany L, CMA for the following reason: Error. Renewing this prescription may not be appropriate.   Requested Prescriptions  Pending Prescriptions Disp Refills   nitrofurantoin, macrocrystal-monohydrate, (MACROBID) 100 MG capsule [Pharmacy Med Name: NITROFURANTOIN MONO-MCR 100 MG] 10 capsule 0    Sig: TAKE 1 CAPSULE BY MOUTH TWICE A DAY      Off-Protocol Failed - 04/24/2021  2:12 PM      Failed - Medication not assigned to a protocol, review manually.      Passed - Valid encounter within last 12 months    Recent Outpatient Visits           1 month ago Acute bronchitis, unspecified organism   [facility] Jon Billings, NP   2 months ago Annual physical exam   [facility] Jon Billings, NP   3 months ago Environmental allergies   [place], NP   7 months ago Acute diverticulitis   [place], [place], DO   10 months ago Essential hypertension   [facility] Eulogio Bear, NP       Future Appointments             In 3 months Jon Billings, NP [facility] [place]   In 6 months Amalia Hailey, Nyoka Lint, MD [facility]               amoxicillin (AMOXIL) 500 MG capsule [Pharmacy Med Name: AMOXICILLIN 500 MG CAPSULE] 14 capsule 0    Sig: Take 2 capsules (1,000 mg total) by mouth 2 (two) times daily for 10 days.      Off-Protocol Failed - 04/24/2021  2:12 PM      Failed - Medication not assigned to a protocol, review manually.      Passed - Valid encounter within last 12 months    Recent Outpatient Visits           1 month ago Acute bronchitis, unspecified organism   [facility] Jon Billings, NP   2 months ago Annual physical exam   [facility] Jon Billings, NP   3 months ago Environmental allergies   [facility]  Jon Billings, NP   7 months ago Acute diverticulitis   [facility] [place] [place], DO   10 months ago Essential hypertension   [place], Jessica A, NP       Future Appointments             In 3 months Jon Billings, NP [facility] [place]   In 6 months Amalia Hailey, Nyoka Lint, MD [facility]

## 2021-04-25 ENCOUNTER — Encounter: Payer: Self-pay | Admitting: Nurse Practitioner

## 2021-04-25 ENCOUNTER — Other Ambulatory Visit: Payer: Self-pay

## 2021-04-25 ENCOUNTER — Ambulatory Visit (INDEPENDENT_AMBULATORY_CARE_PROVIDER_SITE_OTHER): Payer: 59 | Admitting: Nurse Practitioner

## 2021-04-25 VITALS — BP 128/77 | HR 61 | Temp 99.0°F | Wt 233.0 lb

## 2021-04-25 DIAGNOSIS — J0110 Acute frontal sinusitis, unspecified: Secondary | ICD-10-CM

## 2021-04-25 MED ORDER — DOXYCYCLINE HYCLATE 100 MG PO TABS: 100.0000 mg | ORAL_TABLET | Freq: Two times a day (BID) | ORAL | 0 refills | Status: DC

## 2021-04-25 MED ORDER — HYDROCOD POLST-CPM POLST ER 10-8 MG/5ML PO SUER: 5.0000 mL | Freq: Two times a day (BID) | ORAL | 0 refills | Status: DC | PRN

## 2021-04-25 NOTE — Progress Notes (Signed)
BP 128/77   Pulse 61   Temp 99 F (37.2 C)   Wt 233 lb (105.7 kg)   SpO2 97%   BMI 42.62 kg/m    Subjective:    Patient ID: Victoria Bowers, female    DOB: Aug 15, 1971, 50 y.o.   MRN: [id number]  HPI: Victoria Bowers is a 50 y.o. female  Chief Complaint  Patient presents with   Cough    X 1 week   UPPER RESPIRATORY TRACT INFECTION Worst symptom: cough x 1  Fever: no Cough: yes Shortness of breath: no Wheezing: no Chest pain: no Chest tightness: no Chest congestion: no Nasal congestion: yes Runny nose: yes Post nasal drip: yes Sneezing: no Sore throat: no Swollen glands: yes Sinus pressure: yes Headache: yes Face pain: yes Toothache: no Ear pain: no bilateral Ear pressure: yes bilateral Eyes red/itching:no Eye drainage/crusting: no  Vomiting: yes Rash: no Fatigue: yes Sick contacts: no Strep contacts: no  Context: stable Recurrent sinusitis: yes Relief with OTC cold/cough medications: no  Treatments attempted:  singulair     Relevant past medical, surgical, family and social history reviewed and updated as indicated. Interim medical history since our last visit reviewed. Allergies and medications reviewed and updated.  Review of Systems  Constitutional:  Positive for fatigue. Negative for fever.  HENT:  Positive for congestion, postnasal drip, rhinorrhea, sinus pressure and sore throat. Negative for dental problem, ear pain, sinus pain and sneezing.   Respiratory:  Positive for cough. Negative for shortness of breath and wheezing.   Cardiovascular:  Negative for chest pain.  Gastrointestinal:  Positive for vomiting.  Skin:  Negative for rash.  Neurological:  Positive for headaches.   Per HPI unless specifically indicated above     Objective:    BP 128/77   Pulse 61   Temp 99 F (37.2 C)   Wt 233 lb (105.7 kg)   SpO2 97%   BMI 42.62 kg/m   Wt Readings from Last 3 Encounters:  04/25/21 233 lb (105.7 kg)  03/20/21 240 lb (108.9 kg)   02/11/21 240 lb (108.9 kg)    Physical Exam Vitals and nursing note reviewed.  Constitutional:      General: She is not in acute distress.    Appearance: Normal appearance. She is normal weight. She is not ill-appearing, toxic-appearing or diaphoretic.  HENT:     Head: Normocephalic.     Right Ear: Ear canal and external ear normal. A middle ear effusion is present. Tympanic membrane is not erythematous.     Left Ear: Ear canal and external ear normal. A middle ear effusion is present. Tympanic membrane is not erythematous.     Nose: Congestion and rhinorrhea present.     Right Sinus: Frontal sinus tenderness present.     Left Sinus: Frontal sinus tenderness present.     Mouth/Throat:     Mouth: Mucous membranes are moist.     Pharynx: Oropharynx is clear. Posterior oropharyngeal erythema present. No oropharyngeal exudate.  Eyes:     General:        Right eye: No discharge.        Left eye: No discharge.     Extraocular Movements: Extraocular movements intact.     Conjunctiva/sclera: Conjunctivae normal.     Pupils: Pupils are equal, round, and reactive to light.  Cardiovascular:     Rate and Rhythm: Normal rate and regular rhythm.     Heart sounds: No murmur heard. Pulmonary:  Effort: Pulmonary effort is normal. No respiratory distress.     Breath sounds: Normal breath sounds. No wheezing or rales.  Musculoskeletal:     Cervical back: Normal range of motion and neck supple.  Skin:    General: Skin is warm and dry.     Capillary Refill: Capillary refill takes less than 2 seconds.  Neurological:     General: No focal deficit present.     Mental Status: She is alert and oriented to person, place, and time. Mental status is at baseline.  Psychiatric:        Mood and Affect: Mood normal.        Behavior: Behavior normal.        Thought Content: Thought content normal.        Judgment: Judgment normal.    Results for orders placed or performed in visit on 02/11/21   Microscopic Examination   Urine  Result Value Ref Range   WBC, UA None seen 0 - 5 /hpf   RBC 0-2 0 - 2 /hpf   Epithelial Cells (non renal) 0-10 0 - 10 /hpf   Mucus, UA Present (A) Not Estab.   Bacteria, UA Few (A) None seen/Few  CBC with Differential/Platelet  Result Value Ref Range   WBC 11.4 (H) 3.4 - 10.8 x10E3/uL   RBC 4.72 3.77 - 5.28 x10E6/uL   Hemoglobin 13.8 11.1 - 15.9 g/dL   Hematocrit 41.4 34.0 - 46.6 %   MCV 88 79 - 97 fL   MCH 29.2 26.6 - 33.0 pg   MCHC 33.3 31.5 - 35.7 g/dL   RDW 12.7 11.7 - 15.4 %   Platelets 408 150 - 450 x10E3/uL   Neutrophils 68 Not Estab. %   Lymphs 20 Not Estab. %   Monocytes 6 Not Estab. %   Eos 4 Not Estab. %   Basos 1 Not Estab. %   Neutrophils Absolute 8.0 (H) 1.4 - 7.0 x10E3/uL   Lymphocytes Absolute 2.3 0.7 - 3.1 x10E3/uL   Monocytes Absolute 0.7 0.1 - 0.9 x10E3/uL   EOS (ABSOLUTE) 0.4 0.0 - 0.4 x10E3/uL   Basophils Absolute 0.1 0.0 - 0.2 x10E3/uL   Immature Granulocytes 1 Not Estab. %   Immature Grans (Abs) 0.1 0.0 - 0.1 x10E3/uL  Comprehensive metabolic panel  Result Value Ref Range   Glucose 116 (H) 65 - 99 mg/dL   BUN 13 6 - 24 mg/dL   Creatinine, Ser 0.84 0.57 - 1.00 mg/dL   eGFR 84 >59 mL/min/1.73   BUN/Creatinine Ratio 15 9 - 23   Sodium 137 134 - 144 mmol/L   Potassium 4.8 3.5 - 5.2 mmol/L   Chloride 100 96 - 106 mmol/L   CO2 18 (L) 20 - 29 mmol/L   Calcium 9.2 8.7 - 10.2 mg/dL   Total Protein 7.1 6.0 - 8.5 g/dL   Albumin 4.3 3.8 - 4.9 g/dL   Globulin, Total 2.8 1.5 - 4.5 g/dL   Albumin/Globulin Ratio 1.5 1.2 - 2.2   Bilirubin Total 0.3 0.0 - 1.2 mg/dL   Alkaline Phosphatase 112 44 - 121 IU/L   AST 17 0 - 40 IU/L   ALT 16 0 - 32 IU/L  Lipid panel  Result Value Ref Range   Cholesterol, Total 184 100 - 199 mg/dL   Triglycerides 185 (H) 0 - 149 mg/dL   HDL 42 >39 mg/dL   VLDL Cholesterol Cal 32 5 - 40 mg/dL   LDL Chol Calc (NIH) 110 (H) 0 - 99  mg/dL   Chol/HDL Ratio 4.4 0.0 - 4.4 ratio  TSH  Result Value Ref  Range   TSH 3.670 0.450 - 4.500 uIU/mL  Urinalysis, Routine w reflex microscopic  Result Value Ref Range   Specific Gravity, UA 1.025 1.005 - 1.030   pH, UA 6.0 5.0 - 7.5   Color, UA Yellow Yellow   Appearance Ur Clear Clear   Leukocytes,UA Negative Negative   Protein,UA Negative Negative/Trace   Glucose, UA Negative Negative   Ketones, UA Negative Negative   RBC, UA Trace (A) Negative   Bilirubin, UA Negative Negative   Urobilinogen, Ur 0.2 0.2 - 1.0 mg/dL   Nitrite, UA Negative Negative   Microscopic Examination See below:   Vitamin D (25 hydroxy)  Result Value Ref Range   Vit D, 25-Hydroxy 14.9 (L) 30.0 - 100.0 ng/mL      Assessment & Plan:   Problem List Items Addressed This Visit   None Visit Diagnoses     Acute non-recurrent frontal sinusitis    -  Primary   doxycycline sent to the pharmacy. Refills tussinex. Discussed with patient that if it reoccurs will need to send to ENT for further evaluation due to reoccurenc   Relevant Medications   doxycycline (VIBRA-TABS) 100 MG tablet   chlorpheniramine-HYDROcodone (TUSSIONEX PENNKINETIC ER) 10-8 MG/5ML SUER        Follow up plan: Return if symptoms worsen or fail to improve.

## 2021-08-15 NOTE — Progress Notes (Signed)
BP 131/62   Pulse 67   Temp (!) 97.5 F (36.4 C) (Oral)   Ht 5' 2.5" (1.588 m)   Wt 241 lb 6.4 oz (109.5 kg)   SpO2 96%   BMI 43.45 kg/m    Subjective:    Patient ID: Victoria Bowers, female    DOB: Feb 02, 1971, 50 y.o.   MRN: [id number]  HPI: Victoria Bowers is a 50 y.o. female  Chief Complaint  Patient presents with   Hyperlipidemia   Hypertension   Diabetes   URI    Pt states she has been having congestion, sinus pressure and a headache for almost 2 weeks. States she has had 2 rounds of antibiotics previously that helped but never seemed to completely clear symptoms    HYPERTENSION / HYPERLIPIDEMIA Satisfied with current treatment? no Duration of hypertension: years BP monitoring frequency: not checking BP range:  BP medication side effects: no Past BP meds: amlodipine, atenolol, and olmesartan (benicar) Duration of hyperlipidemia: years Cholesterol medication side effects: no Cholesterol supplements: none Past cholesterol medications: none Medication compliance: excellent compliance Aspirin: no Recent stressors: no Recurrent headaches: no Visual changes: no Palpitations: no Dyspnea: no Chest pain: no Lower extremity edema: no Dizzy/lightheaded: no  UPPER RESPIRATORY TRACT INFECTION Worst symptom: symptoms have been going on for about 2 weeks Fever: no Cough: yes Shortness of breath: no Wheezing: no Chest pain: no Chest tightness: no Chest congestion: no Nasal congestion: yes Runny nose: yes Post nasal drip:  yes Sneezing:  some Sore throat: yes Swollen glands: yes Sinus pressure: yes Headache: yes Face pain: yes Toothache: no Ear pain: yes left Ear pressure: yes left Eyes red/itching:no Eye drainage/crusting: no  Vomiting: no Rash: no Fatigue: yes Sick contacts: no Strep contacts: no  Context: worse Recurrent sinusitis: no Relief with OTC cold/cough medications: yes  Treatments attempted: cold/sinus, mucinex, and cough syrup     Relevant past medical, surgical, family and social history reviewed and updated as indicated. Interim medical history since our last visit reviewed. Allergies and medications reviewed and updated.  Review of Systems  Constitutional:  Positive for fatigue. Negative for fever.  HENT:  Positive for congestion, ear pain, postnasal drip, rhinorrhea, sinus pressure, sinus pain, sneezing and sore throat. Negative for dental problem.   Eyes:  Negative for visual disturbance.  Respiratory:  Negative for cough, chest tightness, shortness of breath and wheezing.   Cardiovascular:  Negative for chest pain, palpitations and leg swelling.  Gastrointestinal:  Negative for vomiting.  Skin:  Negative for rash.  Neurological:  Positive for headaches. Negative for dizziness.   Per HPI unless specifically indicated above     Objective:    BP 131/62   Pulse 67   Temp (!) 97.5 F (36.4 C) (Oral)   Ht 5' 2.5" (1.588 m)   Wt 241 lb 6.4 oz (109.5 kg)   SpO2 96%   BMI 43.45 kg/m   Wt Readings from Last 3 Encounters:  08/19/21 241 lb 6.4 oz (109.5 kg)  04/25/21 233 lb (105.7 kg)  03/20/21 240 lb (108.9 kg)    Physical Exam Vitals and nursing note reviewed.  Constitutional:      General: She is not in acute distress.    Appearance: Normal appearance. She is normal weight. She is not ill-appearing, toxic-appearing or diaphoretic.  HENT:     Head: Normocephalic.     Right Ear: Tympanic membrane and external ear normal.     Left Ear: External ear normal.  Nose: Congestion and rhinorrhea present.     Right Sinus: Maxillary sinus tenderness and frontal sinus tenderness present.     Left Sinus: Maxillary sinus tenderness and frontal sinus tenderness present.     Mouth/Throat:     Mouth: Mucous membranes are moist.     Pharynx: Oropharynx is clear. Posterior oropharyngeal erythema present. No oropharyngeal exudate.  Eyes:     General:        Right eye: No discharge.        Left eye: No  discharge.     Extraocular Movements: Extraocular movements intact.     Conjunctiva/sclera: Conjunctivae normal.     Pupils: Pupils are equal, round, and reactive to light.  Cardiovascular:     Rate and Rhythm: Normal rate and regular rhythm.     Heart sounds: No murmur heard. Pulmonary:     Effort: Pulmonary effort is normal. No respiratory distress.     Breath sounds: Normal breath sounds. No wheezing or rales.  Musculoskeletal:     Cervical back: Normal range of motion and neck supple.  Skin:    General: Skin is warm and dry.     Capillary Refill: Capillary refill takes less than 2 seconds.  Neurological:     General: No focal deficit present.     Mental Status: She is alert and oriented to person, place, and time. Mental status is at baseline.  Psychiatric:        Mood and Affect: Mood normal.        Behavior: Behavior normal.        Thought Content: Thought content normal.        Judgment: Judgment normal.    Results for orders placed or performed in visit on 02/11/21  Microscopic Examination   Urine  Result Value Ref Range   WBC, UA None seen 0 - 5 /hpf   RBC 0-2 0 - 2 /hpf   Epithelial Cells (non renal) 0-10 0 - 10 /hpf   Mucus, UA Present (A) Not Estab.   Bacteria, UA Few (A) None seen/Few  CBC with Differential/Platelet  Result Value Ref Range   WBC 11.4 (H) 3.4 - 10.8 x10E3/uL   RBC 4.72 3.77 - 5.28 x10E6/uL   Hemoglobin 13.8 11.1 - 15.9 g/dL   Hematocrit 41.4 34.0 - 46.6 %   MCV 88 79 - 97 fL   MCH 29.2 26.6 - 33.0 pg   MCHC 33.3 31.5 - 35.7 g/dL   RDW 12.7 11.7 - 15.4 %   Platelets 408 150 - 450 x10E3/uL   Neutrophils 68 Not Estab. %   Lymphs 20 Not Estab. %   Monocytes 6 Not Estab. %   Eos 4 Not Estab. %   Basos 1 Not Estab. %   Neutrophils Absolute 8.0 (H) 1.4 - 7.0 x10E3/uL   Lymphocytes Absolute 2.3 0.7 - 3.1 x10E3/uL   Monocytes Absolute 0.7 0.1 - 0.9 x10E3/uL   EOS (ABSOLUTE) 0.4 0.0 - 0.4 x10E3/uL   Basophils Absolute 0.1 0.0 - 0.2 x10E3/uL    Immature Granulocytes 1 Not Estab. %   Immature Grans (Abs) 0.1 0.0 - 0.1 x10E3/uL  Comprehensive metabolic panel  Result Value Ref Range   Glucose 116 (H) 65 - 99 mg/dL   BUN 13 6 - 24 mg/dL   Creatinine, Ser 0.84 0.57 - 1.00 mg/dL   eGFR 84 >59 mL/min/1.73   BUN/Creatinine Ratio 15 9 - 23   Sodium 137 134 - 144 mmol/L   Potassium 4.8 3.5 -  5.2 mmol/L   Chloride 100 96 - 106 mmol/L   CO2 18 (L) 20 - 29 mmol/L   Calcium 9.2 8.7 - 10.2 mg/dL   Total Protein 7.1 6.0 - 8.5 g/dL   Albumin 4.3 3.8 - 4.9 g/dL   Globulin, Total 2.8 1.5 - 4.5 g/dL   Albumin/Globulin Ratio 1.5 1.2 - 2.2   Bilirubin Total 0.3 0.0 - 1.2 mg/dL   Alkaline Phosphatase 112 44 - 121 IU/L   AST 17 0 - 40 IU/L   ALT 16 0 - 32 IU/L  Lipid panel  Result Value Ref Range   Cholesterol, Total 184 100 - 199 mg/dL   Triglycerides 185 (H) 0 - 149 mg/dL   HDL 42 >39 mg/dL   VLDL Cholesterol Cal 32 5 - 40 mg/dL   LDL Chol Calc (NIH) 110 (H) 0 - 99 mg/dL   Chol/HDL Ratio 4.4 0.0 - 4.4 ratio  TSH  Result Value Ref Range   TSH 3.670 0.450 - 4.500 uIU/mL  Urinalysis, Routine w reflex microscopic  Result Value Ref Range   Specific Gravity, UA 1.025 1.005 - 1.030   pH, UA 6.0 5.0 - 7.5   Color, UA Yellow Yellow   Appearance Ur Clear Clear   Leukocytes,UA Negative Negative   Protein,UA Negative Negative/Trace   Glucose, UA Negative Negative   Ketones, UA Negative Negative   RBC, UA Trace (A) Negative   Bilirubin, UA Negative Negative   Urobilinogen, Ur 0.2 0.2 - 1.0 mg/dL   Nitrite, UA Negative Negative   Microscopic Examination See below:   Vitamin D (25 hydroxy)  Result Value Ref Range   Vit D, 25-Hydroxy 14.9 (L) 30.0 - 100.0 ng/mL      Assessment & Plan:   Problem List Items Addressed This Visit       Cardiovascular and Mediastinum   Essential hypertension    Chronic.  Controlled.  Continue with current medication regimen on Amlodipine 58m, Atenolol 269m and Olmesartan 408m Labs ordered today.  Return  to clinic in 6 months for reevaluation.  Call sooner if concerns arise.        Relevant Medications   amLODipine (NORVASC) 5 MG tablet   atenolol (TENORMIN) 25 MG tablet   olmesartan (BENICAR) 40 MG tablet   Other Relevant Orders   Comp Met (CMET)     Other   Vitamin D deficiency   Relevant Orders   Vitamin D (25 hydroxy)   Obesity, Class III, BMI 40-49.9 (morbid obesity) ([facility]    Recommend a healthy lifestyle through diet and exercise.  Recommend smaller meals that prioritize protein.        Hypercholesterolemia    Chronic.  Controlled.  Continue with current medication regimen.  Labs ordered today.  Return to clinic in 6 months for reevaluation.  Call sooner if concerns arise.        Relevant Medications   amLODipine (NORVASC) 5 MG tablet   atenolol (TENORMIN) 25 MG tablet   olmesartan (BENICAR) 40 MG tablet   Other Relevant Orders   Lipid Profile   Other Visit Diagnoses     Acute non-recurrent maxillary sinusitis       Complete course of antibiotics and steroids. Follow up if symptoms worsen or fail to improve.         Follow up plan: Return in about 6 months (around 02/16/2022) for HTN, HLD, DM2 FU.

## 2021-08-19 ENCOUNTER — Encounter: Payer: Self-pay | Admitting: Nurse Practitioner

## 2021-08-19 ENCOUNTER — Ambulatory Visit (INDEPENDENT_AMBULATORY_CARE_PROVIDER_SITE_OTHER): Payer: 59 | Admitting: Nurse Practitioner

## 2021-08-19 ENCOUNTER — Other Ambulatory Visit: Payer: Self-pay

## 2021-08-19 VITALS — BP 131/62 | HR 67 | Temp 97.5°F | Ht 62.5 in | Wt 241.4 lb

## 2021-08-19 DIAGNOSIS — J0100 Acute maxillary sinusitis, unspecified: Secondary | ICD-10-CM

## 2021-08-19 DIAGNOSIS — E559 Vitamin D deficiency, unspecified: Secondary | ICD-10-CM

## 2021-08-19 DIAGNOSIS — I10 Essential (primary) hypertension: Secondary | ICD-10-CM

## 2021-08-19 DIAGNOSIS — E66813 Obesity, class 3: Secondary | ICD-10-CM

## 2021-08-19 DIAGNOSIS — E7800 Pure hypercholesterolemia, unspecified: Secondary | ICD-10-CM | POA: Diagnosis not present

## 2021-08-19 MED ORDER — ATENOLOL 25 MG PO TABS: 25.0000 mg | ORAL_TABLET | Freq: Every day | ORAL | 1 refills | Status: DC

## 2021-08-19 MED ORDER — OLMESARTAN MEDOXOMIL 40 MG PO TABS: 40.0000 mg | ORAL_TABLET | Freq: Every day | ORAL | 1 refills | Status: DC

## 2021-08-19 MED ORDER — AMLODIPINE BESYLATE 5 MG PO TABS: 5.0000 mg | ORAL_TABLET | Freq: Every day | ORAL | 1 refills | Status: DC

## 2021-08-19 NOTE — Assessment & Plan Note (Signed)
Chronic.  Controlled.  Continue with current medication regimen.  Labs ordered today.  Return to clinic in 6 months for reevaluation.  Call sooner if concerns arise.  ? ?

## 2021-08-19 NOTE — Assessment & Plan Note (Signed)
Chronic.  Controlled.  Continue with current medication regimen on Amlodipine '5mg'$ , Atenolol '25mg'$ , and Olmesartan '40mg'$ .  Labs ordered today.  Return to clinic in 6 months for reevaluation.  Call sooner if concerns arise.

## 2021-08-19 NOTE — Assessment & Plan Note (Signed)
Recommend a healthy lifestyle through diet and exercise.  Recommend smaller meals that prioritize protein.

## 2021-08-20 LAB — COMPREHENSIVE METABOLIC PANEL
ALT: 21 IU/L (ref 0–32)
AST: 18 IU/L (ref 0–40)
Albumin/Globulin Ratio: 1.7 (ref 1.2–2.2)
Albumin: 4.3 g/dL (ref 3.8–4.9)
Alkaline Phosphatase: 92 IU/L (ref 44–121)
BUN/Creatinine Ratio: 12 (ref 9–23)
BUN: 10 mg/dL (ref 6–24)
Bilirubin Total: 0.4 mg/dL (ref 0.0–1.2)
CO2: 21 mmol/L (ref 20–29)
Calcium: 9 mg/dL (ref 8.7–10.2)
Chloride: 98 mmol/L (ref 96–106)
Creatinine, Ser: 0.85 mg/dL (ref 0.57–1.00)
Globulin, Total: 2.5 g/dL (ref 1.5–4.5)
Glucose: 105 mg/dL — ABNORMAL HIGH (ref 70–99)
Potassium: 4.6 mmol/L (ref 3.5–5.2)
Sodium: 135 mmol/L (ref 134–144)
Total Protein: 6.8 g/dL (ref 6.0–8.5)
eGFR: 82 mL/min/{1.73_m2} (ref >59)

## 2021-08-20 LAB — LIPID PANEL
Chol/HDL Ratio: 4.3 ratio (ref 0.0–4.4)
Cholesterol, Total: 176 mg/dL (ref 100–199)
HDL: 41 mg/dL (ref >39)
LDL Chol Calc (NIH): 109 mg/dL — ABNORMAL HIGH (ref 0–99)
Triglycerides: 145 mg/dL (ref 0–149)
VLDL Cholesterol Cal: 26 mg/dL (ref 5–40)

## 2021-08-20 LAB — VITAMIN D 25 HYDROXY (VIT D DEFICIENCY, FRACTURES): Vit D, 25-Hydroxy: 24.8 ng/mL — ABNORMAL LOW (ref 30.0–100.0)

## 2021-08-20 MED ORDER — AMOXICILLIN-POT CLAVULANATE 875-125 MG PO TABS: 1.0000 | ORAL_TABLET | Freq: Two times a day (BID) | ORAL | 0 refills | Status: AC

## 2021-08-20 MED ORDER — METHYLPREDNISOLONE 4 MG PO TBPK: ORAL_TABLET | ORAL | 0 refills | Status: DC

## 2021-08-20 NOTE — Addendum Note (Signed)
Addended by: Jon Billings on: 08/20/2021 07:55 AM   Modules accepted: Orders

## 2021-08-21 NOTE — Progress Notes (Signed)
Hi Victoria Bowers.  Your blood work looks good.  Vitamin D is still a little but low but improved from prior.  Continue with current regimen. We will recheck it at your next visit. Your triglycerides have improved from prior.  Please let me know if you have any questions.

## 2021-11-18 ENCOUNTER — Other Ambulatory Visit: Payer: Self-pay

## 2021-11-18 ENCOUNTER — Ambulatory Visit (INDEPENDENT_AMBULATORY_CARE_PROVIDER_SITE_OTHER): Payer: 59 | Admitting: Obstetrics and Gynecology

## 2021-11-18 ENCOUNTER — Encounter: Payer: Self-pay | Admitting: Obstetrics and Gynecology

## 2021-11-18 VITALS — BP 121/60 | HR 56 | Ht 62.5 in | Wt 245.9 lb

## 2021-11-18 DIAGNOSIS — Z01419 Encounter for gynecological examination (general) (routine) without abnormal findings: Secondary | ICD-10-CM | POA: Diagnosis not present

## 2021-11-18 DIAGNOSIS — Z1231 Encounter for screening mammogram for malignant neoplasm of breast: Secondary | ICD-10-CM

## 2021-11-18 NOTE — Progress Notes (Signed)
HPI:      Victoria Bowers is a 51 y.o. G0P0000 who LMP was No LMP recorded. (Menstrual status: IUD).  Subjective:   She presents today for her annual examination.  She says she is doing very well with the IUD.  She has only had 2 small episodes of spotting in the last year.  She is very happy with this as her menses used to be very heavy. She reports no signs or symptoms of menopause at this time including hot flashes night sweats vaginal dryness etc. She gets her blood work performed at her primary care doctor twice yearly. She is due for mammogram. She states that she has had a colonoscopy and it shows that her diverticulitis issue is currently resolved.    Hx: The following portions of the patient's history were reviewed and updated as appropriate:             She  has a past medical history of Allergy, Bertolotti's syndrome, Complication of anesthesia, Fatty liver, Fibroid, GERD (gastroesophageal reflux disease), Hypertension, Ovarian cyst, and PONV (postoperative nausea and vomiting). She does not have any pertinent problems on file. She  has a past surgical history that includes Cholecystectomy; Spine surgery (ruptured disc); Nasal sinus surgery (X2); Tonsillectomy and adenoidectomy (1985); Hysteroscopy with D & C (N/A, 03/28/2019); Intrauterine device (iud) insertion (N/A, 03/28/2019); Cervical polypectomy; Tonsillectomy; Colonoscopy with propofol (N/A, 11/26/2020); and Colonoscopy with propofol (N/A, 12/17/2020). Her family history includes Heart disease in her mother; Hypertension in her brother, brother, father, and paternal grandmother. She  reports that she has never smoked. She has never used smokeless tobacco. She reports that she does not drink alcohol and does not use drugs. She has a current medication list which includes the following prescription(s): amlodipine, atenolol, fluticasone, levonorgestrel, olmesartan, and methylprednisolone. She is allergic to elemental sulfur and  sulfa antibiotics.       Review of Systems:  Review of Systems  Constitutional: Denied constitutional symptoms, night sweats, recent illness, fatigue, fever, insomnia and weight loss.  Eyes: Denied eye symptoms, eye pain, photophobia, vision change and visual disturbance.  Ears/Nose/Throat/Neck: Denied ear, nose, throat or neck symptoms, hearing loss, nasal discharge, sinus congestion and sore throat.  Cardiovascular: Denied cardiovascular symptoms, arrhythmia, chest pain/pressure, edema, exercise intolerance, orthopnea and palpitations.  Respiratory: Denied pulmonary symptoms, asthma, pleuritic pain, productive sputum, cough, dyspnea and wheezing.  Gastrointestinal: Denied, gastro-esophageal reflux, melena, nausea and vomiting.  Genitourinary: Denied genitourinary symptoms including symptomatic vaginal discharge, pelvic relaxation issues, and urinary complaints.  Musculoskeletal: Denied musculoskeletal symptoms, stiffness, swelling, muscle weakness and myalgia.  Dermatologic: Denied dermatology symptoms, rash and scar.  Neurologic: Denied neurology symptoms, dizziness, headache, neck pain and syncope.  Psychiatric: Denied psychiatric symptoms, anxiety and depression.  Endocrine: Denied endocrine symptoms including hot flashes and night sweats.   Meds:   Current Outpatient Medications on File Prior to Visit  Medication Sig Dispense Refill   amLODipine (NORVASC) 5 MG tablet Take 1 tablet (5 mg total) by mouth daily. 90 tablet 1   atenolol (TENORMIN) 25 MG tablet Take 1 tablet (25 mg total) by mouth daily. 90 tablet 1   fluticasone (FLONASE) 50 MCG/ACT nasal spray Place 2 sprays into both nostrils every morning.     levonorgestrel (MIRENA) 20 MCG/24HR IUD 1 each by Intrauterine route once.     olmesartan (BENICAR) 40 MG tablet Take 1 tablet (40 mg total) by mouth daily. 90 tablet 1   methylPREDNISolone (MEDROL DOSEPAK) 4 MG TBPK tablet Take as directed.  21 tablet 0   No current  facility-administered medications on file prior to visit.     Objective:     Vitals:   11/18/21 1055  BP: 121/60  Pulse: (!) 56    Filed Weights   11/18/21 1055  Weight: 245 lb 14.4 oz (111.5 kg)              Physical examination General NAD, Conversant  HEENT Atraumatic; Op clear with mmm.  Normo-cephalic. Pupils reactive. Anicteric sclerae  Thyroid/Neck Smooth without nodularity or enlargement. Normal ROM.  Neck Supple.  Skin No rashes, lesions or ulceration. Normal palpated skin turgor. No nodularity.  Breasts: No masses or discharge.  Symmetric.  No axillary adenopathy.  Lungs: Clear to auscultation.No rales or wheezes. Normal Respiratory effort, no retractions.  Heart: NSR.  No murmurs or rubs appreciated. No periferal edema  Abdomen: Soft.  Non-tender.  No masses.  No HSM. No hernia  Extremities: Moves all appropriately.  Normal ROM for age. No lymphadenopathy.  Neuro: Oriented to PPT.  Normal mood. Normal affect.     Pelvic:   Vulva: Normal appearance.  No lesions.  Vagina: No lesions or abnormalities noted.  Support: Normal pelvic support.  Urethra No masses tenderness or scarring.  Meatus Normal size without lesions or prolapse.  Cervix: Normal appearance.  No lesions.  IUD strings noted outside cervical os  Anus: Normal exam.  No lesions.  Perineum: Normal exam.  No lesions.        Bimanual   Uterus: Normal size.  Non-tender.  Mobile.  AV.  Adnexae: No masses.  Non-tender to palpation.  Cul-de-sac: Negative for abnormality.     Assessment:    G0P0000 Patient Active Problem List   Diagnosis Date Noted   Hypercholesterolemia 02/11/2021   Leukocytosis    Obesity, Class III, BMI 40-49.9 (morbid obesity) ([place])    Acute diverticulitis 09/09/2020   Acute lower UTI 09/09/2020   Disorder of skin of trunk 03/18/2018   Displacement of lumbar intervertebral disc without myelopathy 03/18/2018   Inflammation of sacroiliac joint ([place]) 03/18/2018   Low back pain  03/18/2018   Vitamin D deficiency 05/15/2017   DUB (dysfunctional uterine bleeding) 03/21/2016   Left ovarian cyst 03/19/2016   Abnormal bleeding in menstrual cycle 03/10/2016   Left elbow tendonitis 11/28/2015   Fatty liver 07/16/2015   Migraine 07/16/2015   Essential hypertension 03/09/2015   Obesity 03/09/2015     1. Well woman exam with routine gynecological exam   2. Screening mammogram for breast cancer   3. Obesity, Class III, BMI 40-49.9 (morbid obesity) ([place])        Plan:            1.  Basic Screening Recommendations The basic screening recommendations for asymptomatic women were discussed with the patient during her visit.  The age-appropriate recommendations were discussed with her and the rational for the tests reviewed.  When I am informed by the patient that another primary care physician has previously obtained the age-appropriate tests and they are up-to-date, only outstanding tests are ordered and referrals given as necessary.  Abnormal results of tests will be discussed with her when all of her results are completed.  Routine preventative health maintenance measures emphasized: Exercise/Diet/Weight control, Tobacco Warnings, Alcohol/Substance use risks and Stress Management Mammogram ordered 2.  Discussed changes in length of IUD use associated with Mirena. 3.  Patient will contact us if she begins to have any menopausal symptoms.  Orders Orders Placed This Encounter  Procedures   MM DIGITAL SCREENING BILATERAL    No orders of the defined types were placed in this encounter.         F/U  Return in about 1 year (around 11/18/2022) for Annual Physical.  Finis Bud, M.D. 11/18/2021 11:26 AM

## 2021-11-18 NOTE — Progress Notes (Signed)
Patient presents for annual exam. Patient is current with her pap smear and due for mammogram, ordered and advise patient. Patient states no concerns or questions.

## 2021-11-19 ENCOUNTER — Encounter: Payer: 59 | Admitting: Obstetrics and Gynecology

## 2022-02-13 ENCOUNTER — Other Ambulatory Visit: Payer: Self-pay | Admitting: Nurse Practitioner

## 2022-02-13 DIAGNOSIS — I10 Essential (primary) hypertension: Secondary | ICD-10-CM

## 2022-02-13 NOTE — Telephone Encounter (Signed)
Requested Prescriptions  Pending Prescriptions Disp Refills  . olmesartan (BENICAR) 40 MG tablet [Pharmacy Med Name: OLMESARTAN MEDOXOMIL 40 MG TAB] 90 tablet 1    Sig: TAKE 1 TABLET BY MOUTH EVERY DAY     Cardiovascular:  Angiotensin Receptor Blockers Passed - 02/13/2022  2:10 AM      Passed - Cr in normal range and within 180 days    Creatinine  Date Value Ref Range Status  03/09/2013 0.92 0.60 - 1.30 mg/dL Final   Creatinine, Ser  Date Value Ref Range Status  08/19/2021 0.85 0.57 - 1.00 mg/dL Final         Passed - K in normal range and within 180 days    Potassium  Date Value Ref Range Status  08/19/2021 4.6 3.5 - 5.2 mmol/L Final  03/09/2013 3.7 3.5 - 5.1 mmol/L Final         Passed - Patient is not pregnant      Passed - Last BP in normal range    BP Readings from Last 1 Encounters:  11/18/21 121/60         Passed - Valid encounter within last 6 months    Recent Outpatient Visits          5 months ago Obesity, Class III, BMI 40-49.9 (morbid obesity) ([place])   [facility] Jon Billings, NP   9 months ago Acute non-recurrent frontal sinusitis   [facility] Jon Billings, NP   11 months ago Acute bronchitis, unspecified organism   [facility] Jon Billings, NP   1 year ago Annual physical exam   [facility] Jon Billings, NP   1 year ago Environmental allergies   [facility] Jon Billings, NP      Future Appointments            In 4 days Jon Billings, NP [facility] [place]   In 9 months Amalia Hailey, Nyoka Lint, MD [facility]           . amLODipine (NORVASC) 5 MG tablet [Pharmacy Med Name: AMLODIPINE BESYLATE 5 MG TAB] 90 tablet 1    Sig: TAKE 1 TABLET (5 MG TOTAL) BY MOUTH DAILY.     Cardiovascular: Calcium Channel Blockers 2 Passed - 02/13/2022  2:10 AM      Passed - Last BP in normal range    BP Readings from Last 1 Encounters:  11/18/21 121/60          Passed - Last Heart Rate in normal range    Pulse Readings from Last 1 Encounters:  11/18/21 (!) 80         Passed - Valid encounter within last 6 months    Recent Outpatient Visits          5 months ago Obesity, Class III, BMI 40-49.9 (morbid obesity) ([place])   [facility] Jon Billings, NP   9 months ago Acute non-recurrent frontal sinusitis   [facility] Jon Billings, NP   11 months ago Acute bronchitis, unspecified organism   [facility] Jon Billings, NP   1 year ago Annual physical exam   [facility] Jon Billings, NP   1 year ago Environmental allergies   [facility] Jon Billings, NP      Future Appointments            In 4 days Jon Billings, NP [facility] [place]   In 9 months Amalia Hailey, Nyoka Lint, MD  [facility]           . atenolol (TENORMIN) 25 MG tablet [Pharmacy Med Name: ATENOLOL 25 MG TABLET] 90 tablet 1    Sig: TAKE 1 TABLET (25 MG TOTAL) BY MOUTH DAILY.     Cardiovascular: Beta Blockers 2 Passed - 02/13/2022  2:10 AM      Passed - Cr in normal range and within 360 days    Creatinine  Date Value Ref Range Status  03/09/2013 0.92 0.60 - 1.30 mg/dL Final   Creatinine, Ser  Date Value Ref Range Status  08/19/2021 0.85 0.57 - 1.00 mg/dL Final         Passed - Last BP in normal range    BP Readings from Last 1 Encounters:  11/18/21 121/60         Passed - Last Heart Rate in normal range    Pulse Readings from Last 1 Encounters:  11/18/21 (!) 32         Passed - Valid encounter within last 6 months    Recent Outpatient Visits          5 months ago Obesity, Class III, BMI 40-49.9 (morbid obesity) ([place])   [facility] Jon Billings, NP   9 months ago Acute non-recurrent frontal sinusitis   [facility] Jon Billings, NP   11 months ago Acute bronchitis, unspecified organism   [facility]  Jon Billings, NP   1 year ago Annual physical exam   [facility] Jon Billings, NP   1 year ago Environmental allergies   [facility] Jon Billings, NP      Future Appointments            In 4 days Jon Billings, NP [facility], [place]   In 9 months Amalia Hailey, Nyoka Lint, MD [facility]

## 2022-02-14 NOTE — Progress Notes (Signed)
BP 120/82   Pulse 62   Temp 98.4 F (36.9 C) (Oral)   Wt 243 lb 6.4 oz (110.4 kg)   SpO2 96%   BMI 43.81 kg/m    Subjective:    Patient ID: Victoria Bowers, female    DOB: 09-27-1971, 51 y.o.   MRN: [id number]  HPI: Victoria Bowers is a 51 y.o. female presenting on 02/17/2022 for comprehensive medical examination. Current medical complaints include:none  She currently lives with: Husband Menopausal Symptoms:  Not since having IUD placed.  HYPERTENSION / HYPERLIPIDEMIA Satisfied with current treatment? no Duration of hypertension: years BP monitoring frequency: not checking BP range:  BP medication side effects: no Past BP meds: amlodipine, atenolol and olmesartan (benicar) Duration of hyperlipidemia: years Cholesterol medication side effects: no Cholesterol supplements:none Past cholesterol medications: none Medication compliance: excellent compliance Aspirin: no Recent stressors: no Recurrent headaches: no Visual changes: no Palpitations: no Dyspnea: no Chest pain: no Lower extremity edema: no Dizzy/lightheaded: no  WEIGHT GAIN Duration:  Previous attempts at weight loss: yes Complications of obesity: HTN and HLD    Depression Screen done today and results listed below:     02/17/2022    8:04 AM 08/19/2021    9:48 AM 11/07/2020    1:29 PM 11/24/2019    9:01 AM 11/22/2018    3:43 PM  Depression screen PHQ 2/9  Decreased Interest 0 0 0 0 0  Down, Depressed, Hopeless 0 0 0 0 0  PHQ - 2 Score 0 0 0 0 0  Altered sleeping 1 2  0   Tired, decreased energy 0 2  0   Change in appetite '1 2  2   ' Feeling bad or failure about yourself  0 0  0   Trouble concentrating 0 0  0   Moving slowly or fidgety/restless 0 0  0   Suicidal thoughts 0 0  0   PHQ-9 Score '2 6  2   ' Difficult doing work/chores Not difficult at all Somewhat difficult       The patient does not have a history of falls. I did complete a risk assessment for falls. A plan of care for falls was  documented.   Past Medical History:  Past Medical History:  Diagnosis Date   Allergy    Bertolotti's syndrome    Complication of anesthesia    Fatty liver    Fibroid    GERD (gastroesophageal reflux disease)    OCC   Hypertension    Ovarian cyst    PONV (postoperative nausea and vomiting)    NAUSEATED    Surgical History:  Past Surgical History:  Procedure Laterality Date   CERVICAL POLYPECTOMY     CHOLECYSTECTOMY     COLONOSCOPY WITH PROPOFOL N/A 11/26/2020   Procedure: COLONOSCOPY WITH PROPOFOL;  Surgeon: Jonathon Bellows, MD;  Location: [facility] ENDOSCOPY;  Service: Gastroenterology;  Laterality: N/A;   COLONOSCOPY WITH PROPOFOL N/A 12/17/2020   Procedure: COLONOSCOPY WITH PROPOFOL;  Surgeon: Jonathon Bellows, MD;  Location: [facility] ENDOSCOPY;  Service: Gastroenterology;  Laterality: N/A;   HYSTEROSCOPY WITH D & C N/A 03/28/2019   Procedure: DILATATION AND CURETTAGE /HYSTEROSCOPY;  Surgeon: Harlin Heys, MD;  Location: [facility] ORS;  Service: Gynecology;  Laterality: N/A;   INTRAUTERINE DEVICE (IUD) INSERTION N/A 03/28/2019   Procedure: INTRAUTERINE DEVICE (IUD) INSERTION;  Surgeon: Harlin Heys, MD;  Location: [facility] ORS;  Service: Gynecology;  Laterality: N/A;   NASAL SINUS SURGERY  X2   SPINE SURGERY  ruptured  disc   LUMBAR   TONSILLECTOMY     TONSILLECTOMY AND ADENOIDECTOMY  1985    Medications:  Current Outpatient Medications on File Prior to Visit  Medication Sig   fluticasone (FLONASE) 50 MCG/ACT nasal spray Place 2 sprays into both nostrils every morning.   levonorgestrel (MIRENA) 20 MCG/24HR IUD 1 each by Intrauterine route once.   No current facility-administered medications on file prior to visit.    Allergies:  Allergies  Allergen Reactions   Elemental Sulfur Hives   Sulfa Antibiotics Hives    Social History:  Social History   Socioeconomic History   Marital status: Married    Spouse name: Not on file   Number of children: Not on file   Years of  education: Not on file   Highest education level: Not on file  Occupational History   Not on file  Tobacco Use   Smoking status: Never   Smokeless tobacco: Never  Vaping Use   Vaping Use: Never used  Substance and Sexual Activity   Alcohol use: No    Alcohol/week: 0.0 standard drinks   Drug use: No   Sexual activity: Yes    Birth control/protection: I.U.D.  Other Topics Concern   Not on file  Social History Narrative   Not on file   Social Determinants of Health   Financial Resource Strain: Not on file  Food Insecurity: Not on file  Transportation Needs: Not on file  Physical Activity: Not on file  Stress: Not on file  Social Connections: Not on file  Intimate Partner Violence: Not on file   Social History   Tobacco Use  Smoking Status Never  Smokeless Tobacco Never   Social History   Substance and Sexual Activity  Alcohol Use No   Alcohol/week: 0.0 standard drinks    Family History:  Family History  Problem Relation Age of Onset   Hypertension Father    Hypertension Brother    Hypertension Paternal Grandmother    Hypertension Brother    Heart disease Mother     Past medical history, surgical history, medications, allergies, family history and social history reviewed with patient today and changes made to appropriate areas of the chart.   Review of Systems  Constitutional:        Weight gain  Eyes:  Negative for blurred vision and double vision.  Respiratory:  Negative for shortness of breath.   Cardiovascular:  Negative for chest pain, palpitations and leg swelling.  Neurological:  Negative for dizziness and headaches.  All other ROS negative except what is listed above and in the HPI.      Objective:    BP 120/82   Pulse 62   Temp 98.4 F (36.9 C) (Oral)   Wt 243 lb 6.4 oz (110.4 kg)   SpO2 96%   BMI 43.81 kg/m   Wt Readings from Last 3 Encounters:  02/17/22 243 lb 6.4 oz (110.4 kg)  11/18/21 245 lb 14.4 oz (111.5 kg)  08/19/21 241 lb  6.4 oz (109.5 kg)    Physical Exam Vitals and nursing note reviewed.  Constitutional:      General: She is awake. She is not in acute distress.    Appearance: She is well-developed. She is obese. She is not ill-appearing.  HENT:     Head: Normocephalic and atraumatic.     Right Ear: Hearing, tympanic membrane, ear canal and external ear normal. No drainage.     Left Ear: Hearing, tympanic membrane, ear canal and external  ear normal. No drainage.     Nose: Nose normal.     Right Sinus: No maxillary sinus tenderness or frontal sinus tenderness.     Left Sinus: No maxillary sinus tenderness or frontal sinus tenderness.     Mouth/Throat:     Mouth: Mucous membranes are moist.     Pharynx: Oropharynx is clear. Uvula midline. No pharyngeal swelling, oropharyngeal exudate or posterior oropharyngeal erythema.  Eyes:     General: Lids are normal.        Right eye: No discharge.        Left eye: No discharge.     Extraocular Movements: Extraocular movements intact.     Conjunctiva/sclera: Conjunctivae normal.     Pupils: Pupils are equal, round, and reactive to light.     Visual Fields: Right eye visual fields normal and left eye visual fields normal.  Neck:     Thyroid: No thyromegaly.     Vascular: No carotid bruit.     Trachea: Trachea normal.  Cardiovascular:     Rate and Rhythm: Normal rate and regular rhythm.     Heart sounds: Normal heart sounds. No murmur heard.   No gallop.  Pulmonary:     Effort: Pulmonary effort is normal. No accessory muscle usage or respiratory distress.     Breath sounds: Normal breath sounds.  Chest:  Breasts:    Right: Normal.     Left: Normal.  Abdominal:     General: Bowel sounds are normal.     Palpations: Abdomen is soft. There is no hepatomegaly or splenomegaly.     Tenderness: There is no abdominal tenderness.  Musculoskeletal:        General: Normal range of motion.     Cervical back: Normal range of motion and neck supple.     Right lower  leg: No edema.     Left lower leg: No edema.  Lymphadenopathy:     Head:     Right side of head: No submental, submandibular, tonsillar, preauricular or posterior auricular adenopathy.     Left side of head: No submental, submandibular, tonsillar, preauricular or posterior auricular adenopathy.     Cervical: No cervical adenopathy.     Upper Body:     Right upper body: No supraclavicular, axillary or pectoral adenopathy.     Left upper body: No supraclavicular, axillary or pectoral adenopathy.  Skin:    General: Skin is warm and dry.     Capillary Refill: Capillary refill takes less than 2 seconds.     Findings: No rash.  Neurological:     Mental Status: She is alert and oriented to person, place, and time.     Gait: Gait is intact.     Deep Tendon Reflexes: Reflexes are normal and symmetric.     Reflex Scores:      Brachioradialis reflexes are 2+ on the right side and 2+ on the left side.      Patellar reflexes are 2+ on the right side and 2+ on the left side. Psychiatric:        Attention and Perception: Attention normal.        Mood and Affect: Mood normal.        Speech: Speech normal.        Behavior: Behavior normal. Behavior is cooperative.        Thought Content: Thought content normal.        Judgment: Judgment normal.    Results for orders placed or performed  in visit on 08/19/21  Comp Met (CMET)  Result Value Ref Range   Glucose 105 (H) 70 - 99 mg/dL   BUN 10 6 - 24 mg/dL   Creatinine, Ser 0.85 0.57 - 1.00 mg/dL   eGFR 82 >59 mL/min/1.73   BUN/Creatinine Ratio 12 9 - 23   Sodium 135 134 - 144 mmol/L   Potassium 4.6 3.5 - 5.2 mmol/L   Chloride 98 96 - 106 mmol/L   CO2 21 20 - 29 mmol/L   Calcium 9.0 8.7 - 10.2 mg/dL   Total Protein 6.8 6.0 - 8.5 g/dL   Albumin 4.3 3.8 - 4.9 g/dL   Globulin, Total 2.5 1.5 - 4.5 g/dL   Albumin/Globulin Ratio 1.7 1.2 - 2.2   Bilirubin Total 0.4 0.0 - 1.2 mg/dL   Alkaline Phosphatase 92 44 - 121 IU/L   AST 18 0 - 40 IU/L   ALT  21 0 - 32 IU/L  Lipid Profile  Result Value Ref Range   Cholesterol, Total 176 100 - 199 mg/dL   Triglycerides 145 0 - 149 mg/dL   HDL 41 >39 mg/dL   VLDL Cholesterol Cal 26 5 - 40 mg/dL   LDL Chol Calc (NIH) 109 (H) 0 - 99 mg/dL   Chol/HDL Ratio 4.3 0.0 - 4.4 ratio  Vitamin D (25 hydroxy)  Result Value Ref Range   Vit D, 25-Hydroxy 24.8 (L) 30.0 - 100.0 ng/mL      Assessment & Plan:   Problem List Items Addressed This Visit       Cardiovascular and Mediastinum   Essential hypertension    Chronic.  Controlled.  Continue with current medication regimen on Amlodipine 70m, Atenolol 260m and Olmesartan 4051m Labs ordered today.  Return to clinic in 6 months for reevaluation.  Call sooner if concerns arise.         Relevant Medications   amLODipine (NORVASC) 5 MG tablet   atenolol (TENORMIN) 25 MG tablet   olmesartan (BENICAR) 40 MG tablet     Other   Vitamin D deficiency    Labs ordered today. Will make recommendations based on lab results.       Relevant Orders   Vitamin D (25 hydroxy)   Obesity, Class III, BMI 40-49.9 (morbid obesity) (HCC)    Chronic. Failed attempts at weight loss with diet and exercise.  Will start Wegovy.  Discussed how to titrate medication.  Discussed side effects and benefits of medication during visit.  Labs ordered today. Follow up in 6 weeks for reevaluation.        Relevant Medications   Semaglutide-Weight Management 0.25 MG/0.5ML SOAJ   Semaglutide-Weight Management 0.5 MG/0.5ML SOAJ (Start on 03/18/2022)   Semaglutide-Weight Management 1 MG/0.5ML SOAJ (Start on 04/16/2022)   Semaglutide-Weight Management 1.7 MG/0.75ML SOAJ (Start on 05/15/2022)   Semaglutide-Weight Management 2.4 MG/0.75ML SOAJ (Start on 06/13/2022)   Hypercholesterolemia    Chronic. Labs ordered today. Will make recommendations based on lab results.        Relevant Medications   amLODipine (NORVASC) 5 MG tablet   atenolol (TENORMIN) 25 MG tablet   olmesartan  (BENICAR) 40 MG tablet   Other Relevant Orders   Lipid panel   Other Visit Diagnoses     Annual physical exam    -  Primary   Health maintenace reviewed during visit today. Labs ordered. Up to date on PAP.  Mammo order already given.  Declined shingles vaccines today.    Relevant Orders   CBC with  Differential/Platelet   Comprehensive metabolic panel   Lipid panel   TSH   Urinalysis, Routine w reflex microscopic   Encounter for hepatitis C screening test for low risk patient       Relevant Orders   Hepatitis C Antibody        Follow up plan: Return in about 6 years (around 02/18/2028) for Weight Managment.   LABORATORY TESTING:  - Pap smear: up to date  IMMUNIZATIONS:   - Tdap: Tetanus vaccination status reviewed: last tetanus booster within 10 years. - Influenza: Up to date - Pneumovax: Not applicable - Prevnar: Not applicable - HPV: Not applicable - Zostavax vaccine: Not applicable  SCREENING: -Mammogram: Ordered today  - Colonoscopy: Up to date  - Bone Density: Not applicable  -Hearing Test: Not applicable  -Spirometry: Not applicable   PATIENT COUNSELING:   Advised to take 1 mg of folate supplement per day if capable of pregnancy.   Sexuality: Discussed sexually transmitted diseases, partner selection, use of condoms, avoidance of unintended pregnancy  and contraceptive alternatives.   Advised to avoid cigarette smoking.  I discussed with the patient that most people either abstain from alcohol or drink within safe limits (<=14/week and <=4 drinks/occasion for males, <=7/weeks and <= 3 drinks/occasion for females) and that the risk for alcohol disorders and other health effects rises proportionally with the number of drinks per week and how often a drinker exceeds daily limits.  Discussed cessation/primary prevention of drug use and availability of treatment for abuse.   Diet: Encouraged to adjust caloric intake to maintain  or achieve ideal body weight, to  reduce intake of dietary saturated fat and total fat, to limit sodium intake by avoiding high sodium foods and not adding table salt, and to maintain adequate dietary potassium and calcium preferably from fresh fruits, vegetables, and low-fat dairy products.    stressed the importance of regular exercise  Injury prevention: Discussed safety belts, safety helmets, smoke detector, smoking near bedding or upholstery.   Dental health: Discussed importance of regular tooth brushing, flossing, and dental visits.    NEXT PREVENTATIVE PHYSICAL DUE IN 1 YEAR. Return in about 6 years (around 02/18/2028) for Weight Managment.

## 2022-02-17 ENCOUNTER — Encounter: Payer: Self-pay | Admitting: Nurse Practitioner

## 2022-02-17 ENCOUNTER — Telehealth: Payer: Self-pay

## 2022-02-17 ENCOUNTER — Ambulatory Visit (INDEPENDENT_AMBULATORY_CARE_PROVIDER_SITE_OTHER): Payer: 59 | Admitting: Nurse Practitioner

## 2022-02-17 VITALS — BP 120/82 | HR 62 | Temp 98.4°F | Wt 243.4 lb

## 2022-02-17 DIAGNOSIS — I10 Essential (primary) hypertension: Secondary | ICD-10-CM | POA: Diagnosis not present

## 2022-02-17 DIAGNOSIS — E7800 Pure hypercholesterolemia, unspecified: Secondary | ICD-10-CM

## 2022-02-17 DIAGNOSIS — Z1159 Encounter for screening for other viral diseases: Secondary | ICD-10-CM

## 2022-02-17 DIAGNOSIS — Z0000 Encounter for general adult medical examination without abnormal findings: Secondary | ICD-10-CM

## 2022-02-17 DIAGNOSIS — R7309 Other abnormal glucose: Secondary | ICD-10-CM

## 2022-02-17 DIAGNOSIS — E559 Vitamin D deficiency, unspecified: Secondary | ICD-10-CM

## 2022-02-17 LAB — MICROSCOPIC EXAMINATION
Bacteria, UA: NONE SEEN
WBC, UA: NONE SEEN /hpf (ref 0–5)

## 2022-02-17 LAB — URINALYSIS, ROUTINE W REFLEX MICROSCOPIC
Bilirubin, UA: NEGATIVE
Glucose, UA: NEGATIVE
Ketones, UA: NEGATIVE
Leukocytes,UA: NEGATIVE
Nitrite, UA: NEGATIVE
Protein,UA: NEGATIVE
Specific Gravity, UA: 1.025 (ref 1.005–1.030)
Urobilinogen, Ur: 0.2 mg/dL (ref 0.2–1.0)
pH, UA: 6 (ref 5.0–7.5)

## 2022-02-17 MED ORDER — ATENOLOL 25 MG PO TABS: 25.0000 mg | ORAL_TABLET | Freq: Every day | ORAL | 1 refills | Status: DC

## 2022-02-17 MED ORDER — SEMAGLUTIDE-WEIGHT MANAGEMENT 0.25 MG/0.5ML ~~LOC~~ SOAJ: 0.2500 mg | SUBCUTANEOUS | 0 refills | Status: AC

## 2022-02-17 MED ORDER — SEMAGLUTIDE-WEIGHT MANAGEMENT 2.4 MG/0.75ML ~~LOC~~ SOAJ: 2.4000 mg | SUBCUTANEOUS | 0 refills | Status: AC

## 2022-02-17 MED ORDER — SEMAGLUTIDE-WEIGHT MANAGEMENT 1 MG/0.5ML ~~LOC~~ SOAJ: 1.0000 mg | SUBCUTANEOUS | 0 refills | Status: AC

## 2022-02-17 MED ORDER — SEMAGLUTIDE-WEIGHT MANAGEMENT 0.5 MG/0.5ML ~~LOC~~ SOAJ: 0.5000 mg | SUBCUTANEOUS | 0 refills | Status: AC

## 2022-02-17 MED ORDER — SEMAGLUTIDE-WEIGHT MANAGEMENT 1.7 MG/0.75ML ~~LOC~~ SOAJ: 1.7000 mg | SUBCUTANEOUS | 0 refills | Status: AC

## 2022-02-17 MED ORDER — AMLODIPINE BESYLATE 5 MG PO TABS: 5.0000 mg | ORAL_TABLET | Freq: Every day | ORAL | 1 refills | Status: DC

## 2022-02-17 MED ORDER — OLMESARTAN MEDOXOMIL 40 MG PO TABS: 40.0000 mg | ORAL_TABLET | Freq: Every day | ORAL | 1 refills | Status: DC

## 2022-02-17 NOTE — Telephone Encounter (Signed)
PA for Mancel Parsons has been initiated and approved over the phone with [employer]. Left VM for patient.   Case #: [id number]

## 2022-02-17 NOTE — Assessment & Plan Note (Signed)
Chronic.  Controlled.  Continue with current medication regimen on Amlodipine '5mg'$ , Atenolol '25mg'$ , and Olmesartan '40mg'$ .  Labs ordered today.  Return to clinic in 6 months for reevaluation.  Call sooner if concerns arise.

## 2022-02-17 NOTE — Assessment & Plan Note (Signed)
Labs ordered today.  Will make recommendations based on lab results. ?

## 2022-02-17 NOTE — Assessment & Plan Note (Signed)
Chronic. Failed attempts at weight loss with diet and exercise.  Will start Wegovy.  Discussed how to titrate medication.  Discussed side effects and benefits of medication during visit.  Labs ordered today. Follow up in 6 weeks for reevaluation.

## 2022-02-17 NOTE — Assessment & Plan Note (Signed)
Chronic.  Labs ordered today. Will make recommendations based on lab results.  

## 2022-02-18 ENCOUNTER — Ambulatory Visit: Payer: Self-pay | Admitting: *Deleted

## 2022-02-18 LAB — TSH: TSH: 2.92 u[IU]/mL (ref 0.450–4.500)

## 2022-02-18 LAB — COMPREHENSIVE METABOLIC PANEL
ALT: 15 IU/L (ref 0–32)
AST: 16 IU/L (ref 0–40)
Albumin/Globulin Ratio: 1.6 (ref 1.2–2.2)
Albumin: 4.1 g/dL (ref 3.8–4.9)
Alkaline Phosphatase: 100 IU/L (ref 44–121)
BUN/Creatinine Ratio: 15 (ref 9–23)
BUN: 12 mg/dL (ref 6–24)
Bilirubin Total: 0.3 mg/dL (ref 0.0–1.2)
CO2: 21 mmol/L (ref 20–29)
Calcium: 9.2 mg/dL (ref 8.7–10.2)
Chloride: 99 mmol/L (ref 96–106)
Creatinine, Ser: 0.79 mg/dL (ref 0.57–1.00)
Globulin, Total: 2.5 g/dL (ref 1.5–4.5)
Glucose: 133 mg/dL — ABNORMAL HIGH (ref 70–99)
Potassium: 4.6 mmol/L (ref 3.5–5.2)
Sodium: 134 mmol/L (ref 134–144)
Total Protein: 6.6 g/dL (ref 6.0–8.5)
eGFR: 90 mL/min/{1.73_m2} (ref >59)

## 2022-02-18 LAB — LIPID PANEL
Chol/HDL Ratio: 4.4 ratio (ref 0.0–4.4)
Cholesterol, Total: 211 mg/dL — ABNORMAL HIGH (ref 100–199)
HDL: 48 mg/dL (ref >39)
LDL Chol Calc (NIH): 140 mg/dL — ABNORMAL HIGH (ref 0–99)
Triglycerides: 128 mg/dL (ref 0–149)
VLDL Cholesterol Cal: 23 mg/dL (ref 5–40)

## 2022-02-18 LAB — CBC WITH DIFFERENTIAL/PLATELET
Basophils Absolute: 0.1 10*3/uL (ref 0.0–0.2)
Basos: 1 %
EOS (ABSOLUTE): 0.4 10*3/uL (ref 0.0–0.4)
Eos: 4 %
Hematocrit: 39.7 % (ref 34.0–46.6)
Hemoglobin: 13.4 g/dL (ref 11.1–15.9)
Immature Grans (Abs): 0 10*3/uL (ref 0.0–0.1)
Immature Granulocytes: 1 %
Lymphocytes Absolute: 1.8 10*3/uL (ref 0.7–3.1)
Lymphs: 22 %
MCH: 30.5 pg (ref 26.6–33.0)
MCHC: 33.8 g/dL (ref 31.5–35.7)
MCV: 90 fL (ref 79–97)
Monocytes Absolute: 0.6 10*3/uL (ref 0.1–0.9)
Monocytes: 7 %
Neutrophils Absolute: 5.5 10*3/uL (ref 1.4–7.0)
Neutrophils: 65 %
Platelets: 375 10*3/uL (ref 150–450)
RBC: 4.4 x10E6/uL (ref 3.77–5.28)
RDW: 12.4 % (ref 11.7–15.4)
WBC: 8.4 10*3/uL (ref 3.4–10.8)

## 2022-02-18 LAB — VITAMIN D 25 HYDROXY (VIT D DEFICIENCY, FRACTURES): Vit D, 25-Hydroxy: 20.6 ng/mL — ABNORMAL LOW (ref 30.0–100.0)

## 2022-02-18 LAB — HEPATITIS C ANTIBODY: Hep C Virus Ab: NONREACTIVE

## 2022-02-18 NOTE — Addendum Note (Signed)
Addended by: Jon Billings on: 02/18/2022 08:04 AM   Modules accepted: Orders

## 2022-02-18 NOTE — Progress Notes (Signed)
Please let patient know that her glucose is elevated.  I'd like her to come back and have her A1c checked which is a measure of diabetes.   Her vitamin D is low.  I recommend starting vitamin D 1000IU once daily.  This can be found over the counter.   Please let patient know that her cholesterol is elevated from prior.  I recommend a low fat diet and exercise as tolerated.  Her cardiac risk score is in the low risk range.  We will continue to reassess at future visits.    Otherwise, her lab work looks good.  Please make her a lab appt.    The 10-year ASCVD risk score (Arnett DK, et al., 2019) is: 2.2%   Values used to calculate the score:     Age: 18 years     Sex: Female     Is Non-Hispanic African American: No     Diabetic: No     Tobacco smoker: No     Systolic Blood Pressure: 947 mmHg     Is BP treated: Yes     HDL Cholesterol: 48 mg/dL     Total Cholesterol: 211 mg/dL

## 2022-02-18 NOTE — Telephone Encounter (Signed)
Pt given lab results per notes of K. Holsworth, NP from 02/18/22 on 02/18/22. Pt verbalized understanding. Appt scheduled for labs 02/27/22.

## 2022-02-27 ENCOUNTER — Other Ambulatory Visit: Payer: 59

## 2022-02-27 DIAGNOSIS — R7309 Other abnormal glucose: Secondary | ICD-10-CM

## 2022-02-28 LAB — HEMOGLOBIN A1C
Est. average glucose Bld gHb Est-mCnc: 134 mg/dL
Hgb A1c MFr Bld: 6.3 % — ABNORMAL HIGH (ref 4.8–5.6)

## 2022-02-28 NOTE — Progress Notes (Signed)
Please let patient know that her lab work shows that her A1c is in the prediabetic range.  It is close to the diabetic range.  However, she does not need medication at thit time.  I recommend a low carb diet and exercise.  Please let me know if she has any questions.

## 2022-03-28 NOTE — Progress Notes (Signed)
   There were no vitals taken for this visit.   Subjective:    Patient ID: Victoria Bowers, female    DOB: Sep 11, 1971, 51 y.o.   MRN: [id number]  HPI: Victoria Bowers is a 51 y.o. female  No chief complaint on file.  WEIGHT MANAGEMENT Duration:  Previous attempts at weight loss: {Blank XNATFT:73220::"URK","YH"} Complications of obesity:  Peak weight:  Weight loss goal:  Weight loss to date:  Requesting obesity pharmacotherapy: {Blank single:19197::"yes","no"} Current weight loss supplements/medications: {Blank single:19197::"yes","no"} Previous weight loss supplements/meds: {Blank single:19197::"yes","no"} Calories:   Relevant past medical, surgical, family and social history reviewed and updated as indicated. Interim medical history since our last visit reviewed. Allergies and medications reviewed and updated.  Review of Systems  Per HPI unless specifically indicated above     Objective:    There were no vitals taken for this visit.  Wt Readings from Last 3 Encounters:  02/17/22 243 lb 6.4 oz (110.4 kg)  11/18/21 245 lb 14.4 oz (111.5 kg)  08/19/21 241 lb 6.4 oz (109.5 kg)    Physical Exam  Results for orders placed or performed in visit on 02/27/22  HgB A1c  Result Value Ref Range   Hgb A1c MFr Bld 6.3 (H) 4.8 - 5.6 %   Est. average glucose Bld gHb Est-mCnc 134 mg/dL      Assessment & Plan:   Problem List Items Addressed This Visit   None    Follow up plan: No follow-ups on file.

## 2022-03-31 ENCOUNTER — Encounter: Payer: Self-pay | Admitting: Nurse Practitioner

## 2022-03-31 ENCOUNTER — Ambulatory Visit (INDEPENDENT_AMBULATORY_CARE_PROVIDER_SITE_OTHER): Payer: 59 | Admitting: Nurse Practitioner

## 2022-03-31 VITALS — BP 128/78 | HR 69 | Temp 98.8°F | Wt 245.4 lb

## 2022-03-31 NOTE — Assessment & Plan Note (Signed)
Chronic. Has been approved for [facility] but hasn't been able to pick it up.  Discussed with patient that we will restart medication in September once back order has been resolved.  Continue with diet and exercise.  Follow up once medication is restarted.

## 2022-06-05 ENCOUNTER — Other Ambulatory Visit: Payer: Self-pay | Admitting: Nurse Practitioner

## 2022-06-05 DIAGNOSIS — Z1231 Encounter for screening mammogram for malignant neoplasm of breast: Secondary | ICD-10-CM

## 2022-06-26 ENCOUNTER — Ambulatory Visit
Admission: RE | Admit: 2022-06-26 | Discharge: 2022-06-26 | Disposition: A | Discharge status: Home / Self Care | Payer: 59 | Source: Ambulatory Visit | Attending: Nurse Practitioner | Admitting: Nurse Practitioner

## 2022-06-26 DIAGNOSIS — Z1231 Encounter for screening mammogram for malignant neoplasm of breast: Secondary | ICD-10-CM | POA: Insufficient documentation

## 2022-06-27 NOTE — Progress Notes (Signed)
Please let patient know her Mammogram did not show any evidence of a malignancy.  The recommendation is to repeat the Mammogram in 1 year.  

## 2022-07-09 ENCOUNTER — Ambulatory Visit (INDEPENDENT_AMBULATORY_CARE_PROVIDER_SITE_OTHER): Payer: 59 | Admitting: Nurse Practitioner

## 2022-07-09 ENCOUNTER — Encounter: Payer: Self-pay | Admitting: Nurse Practitioner

## 2022-07-09 VITALS — BP 138/77 | HR 70 | Temp 98.6°F | Wt 245.1 lb

## 2022-07-09 DIAGNOSIS — N3001 Acute cystitis with hematuria: Secondary | ICD-10-CM

## 2022-07-09 DIAGNOSIS — R300 Dysuria: Secondary | ICD-10-CM | POA: Diagnosis not present

## 2022-07-09 LAB — URINALYSIS, ROUTINE W REFLEX MICROSCOPIC
Bilirubin, UA: NEGATIVE
Glucose, UA: NEGATIVE
Ketones, UA: NEGATIVE
Nitrite, UA: NEGATIVE
Protein,UA: NEGATIVE
Specific Gravity, UA: 1.015 (ref 1.005–1.030)
Urobilinogen, Ur: 0.2 mg/dL (ref 0.2–1.0)
pH, UA: 7 (ref 5.0–7.5)

## 2022-07-09 LAB — MICROSCOPIC EXAMINATION: Bacteria, UA: NONE SEEN

## 2022-07-09 MED ORDER — NITROFURANTOIN MONOHYD MACRO 100 MG PO CAPS: 100.0000 mg | ORAL_CAPSULE | Freq: Two times a day (BID) | ORAL | 0 refills | Status: DC

## 2022-07-09 NOTE — Progress Notes (Signed)
BP 138/77   Pulse 70   Temp 98.6 F (37 C) (Oral)   Wt 245 lb 1.6 oz (111.2 kg)   SpO2 97%   BMI 44.11 kg/m    Subjective:    Patient ID: Victoria Bowers, female    DOB: Dec 19, 1970, 51 y.o.   MRN: [id number]  HPI: Victoria Bowers is a 51 y.o. female  Chief Complaint  Patient presents with   Urinary Urgency    X 2 days, dysuria onset Monday and has worsened per patient.    URINARY SYMPTOMS Dysuria: no Urinary frequency: yes Urgency: yes Small volume voids: yes Symptom severity: no Urinary incontinence: no Foul odor: no Hematuria: no Abdominal pain: yes Back pain: no Suprapubic pain/pressure: yes Flank pain: no Fever:  no Vomiting: no Relief with cranberry juice: no Relief with pyridium: no Status: worse Previous urinary tract infection: yes Recurrent urinary tract infection: no Sexual activity: sexually active with 1 person History of sexually transmitted disease: no Treatments attempted: none   Relevant past medical, surgical, family and social history reviewed and updated as indicated. Interim medical history since our last visit reviewed. Allergies and medications reviewed and updated.  Review of Systems  Constitutional:  Negative for fever.  Gastrointestinal:  Positive for abdominal pain. Negative for vomiting.  Genitourinary:  Positive for decreased urine volume, frequency and urgency. Negative for dysuria, flank pain and hematuria.  Musculoskeletal:  Negative for back pain.    Per HPI unless specifically indicated above     Objective:    BP 138/77   Pulse 70   Temp 98.6 F (37 C) (Oral)   Wt 245 lb 1.6 oz (111.2 kg)   SpO2 97%   BMI 44.11 kg/m   Wt Readings from Last 3 Encounters:  07/09/22 245 lb 1.6 oz (111.2 kg)  03/31/22 245 lb 6.4 oz (111.3 kg)  02/17/22 243 lb 6.4 oz (110.4 kg)    Physical Exam Vitals and nursing note reviewed.  Constitutional:      General: She is not in acute distress.    Appearance: Normal appearance.  She is normal weight. She is not ill-appearing, toxic-appearing or diaphoretic.  HENT:     Head: Normocephalic.     Right Ear: External ear normal.     Left Ear: External ear normal.     Nose: Nose normal.     Mouth/Throat:     Mouth: Mucous membranes are moist.     Pharynx: Oropharynx is clear.  Eyes:     General:        Right eye: No discharge.        Left eye: No discharge.     Extraocular Movements: Extraocular movements intact.     Conjunctiva/sclera: Conjunctivae normal.     Pupils: Pupils are equal, round, and reactive to light.  Cardiovascular:     Rate and Rhythm: Normal rate and regular rhythm.     Heart sounds: No murmur heard. Pulmonary:     Effort: Pulmonary effort is normal. No respiratory distress.     Breath sounds: Normal breath sounds. No wheezing or rales.  Abdominal:     General: Abdomen is flat. Bowel sounds are normal. There is no distension.     Palpations: Abdomen is soft.     Tenderness: There is abdominal tenderness in the left lower quadrant. There is no right CVA tenderness, left CVA tenderness or guarding.  Musculoskeletal:     Cervical back: Normal range of motion and neck supple.  Skin:  General: Skin is warm and dry.     Capillary Refill: Capillary refill takes less than 2 seconds.  Neurological:     General: No focal deficit present.     Mental Status: She is alert and oriented to person, place, and time. Mental status is at baseline.  Psychiatric:        Mood and Affect: Mood normal.        Behavior: Behavior normal.        Thought Content: Thought content normal.        Judgment: Judgment normal.     Results for orders placed or performed in visit on 02/27/22  HgB A1c  Result Value Ref Range   Hgb A1c MFr Bld 6.3 (H) 4.8 - 5.6 %   Est. average glucose Bld gHb Est-mCnc 134 mg/dL      Assessment & Plan:   Problem List Items Addressed This Visit   None Visit Diagnoses     Acute cystitis with hematuria    -  Primary   Positive  for Leukocytes and Blood. Will treat with Macrobid. Will send for culture.  Increase fluid intake. If not improved with order ultrasound to evaluate.   Relevant Orders   Urine Culture   Dysuria       Relevant Orders   Urinalysis, Routine w reflex microscopic        Follow up plan: Return if symptoms worsen or fail to improve.

## 2022-07-09 NOTE — Progress Notes (Signed)
Results discussed during visit.

## 2022-07-11 LAB — URINE CULTURE

## 2022-07-11 NOTE — Progress Notes (Signed)
HI Victoria Bowers.  Your urine did not grow any bacteria.  If your pain is persistent I will order the ultrasound for further evaluation.

## 2022-08-18 ENCOUNTER — Ambulatory Visit (INDEPENDENT_AMBULATORY_CARE_PROVIDER_SITE_OTHER): Payer: 59 | Admitting: Nurse Practitioner

## 2022-08-18 ENCOUNTER — Encounter: Payer: Self-pay | Admitting: Nurse Practitioner

## 2022-08-18 VITALS — BP 144/75 | HR 63 | Temp 98.3°F | Wt 244.3 lb

## 2022-08-18 DIAGNOSIS — J0110 Acute frontal sinusitis, unspecified: Secondary | ICD-10-CM

## 2022-08-18 MED ORDER — GUAIFENESIN-CODEINE 100-10 MG/5ML PO SOLN: 5.0000 mL | Freq: Every evening | ORAL | 0 refills | Status: DC

## 2022-08-18 MED ORDER — AMOXICILLIN 500 MG PO CAPS: 500.0000 mg | ORAL_CAPSULE | Freq: Two times a day (BID) | ORAL | 0 refills | Status: AC

## 2022-08-18 NOTE — Progress Notes (Signed)
BP (!) 144/75   Pulse 63   Temp 98.3 F (36.8 C) (Oral)   Wt 244 lb 4.8 oz (110.8 kg)   SpO2 99%   BMI 43.97 kg/m    Subjective:    Patient ID: Victoria Bowers, female    DOB: 1971-04-20, 51 y.o.   MRN: [id number]  HPI: Victoria Bowers is a 51 y.o. female  Chief Complaint  Patient presents with   Cough    Ongoing x q week c.I chest congestion with phlegm , sinus pressure/congestion.    UPPER RESPIRATORY TRACT INFECTION Worst symptom: symptoms started over a week ago Fever: no Cough: yes Shortness of breath: yes Wheezing:a little bit Chest pain: no Chest tightness: yes Chest congestion: yes Nasal congestion: yes Runny nose: yes Post nasal drip: yes Sneezing: yes Sore throat: yes Swollen glands: no Sinus pressure: yes Headache: yes Face pain: no Toothache: no Ear pain: no bilateral Ear pressure: yes bilateral Eyes red/itching:no Eye drainage/crusting: no  Vomiting: no Rash: no Fatigue: yes Sick contacts: no Strep contacts: no  Context: stable Recurrent sinusitis: no Relief with OTC cold/cough medications: yes Treatments attempted: mucinex and cough syrup   Relevant past medical, surgical, family and social history reviewed and updated as indicated. Interim medical history since our last visit reviewed. Allergies and medications reviewed and updated.  Review of Systems  Constitutional:  Positive for fatigue. Negative for fever.  HENT:  Positive for congestion, postnasal drip, rhinorrhea, sinus pressure, sinus pain, sneezing and sore throat. Negative for dental problem and ear pain.   Respiratory:  Positive for cough, shortness of breath and wheezing.   Cardiovascular:  Negative for chest pain.  Gastrointestinal:  Negative for vomiting.  Skin:  Negative for rash.  Neurological:  Positive for headaches.    Per HPI unless specifically indicated above     Objective:    BP (!) 144/75   Pulse 63   Temp 98.3 F (36.8 C) (Oral)   Wt 244 lb 4.8  oz (110.8 kg)   SpO2 99%   BMI 43.97 kg/m   Wt Readings from Last 3 Encounters:  08/18/22 244 lb 4.8 oz (110.8 kg)  07/09/22 245 lb 1.6 oz (111.2 kg)  03/31/22 245 lb 6.4 oz (111.3 kg)    Physical Exam Vitals and nursing note reviewed.  Constitutional:      General: She is not in acute distress.    Appearance: Normal appearance. She is normal weight. She is not ill-appearing, toxic-appearing or diaphoretic.  HENT:     Head: Normocephalic.     Right Ear: Tympanic membrane and external ear normal.     Left Ear: Tympanic membrane and external ear normal.     Nose: Congestion and rhinorrhea present.     Right Sinus: Frontal sinus tenderness present. No maxillary sinus tenderness.     Left Sinus: Frontal sinus tenderness present. No maxillary sinus tenderness.     Mouth/Throat:     Mouth: Mucous membranes are moist.     Pharynx: Oropharynx is clear. Posterior oropharyngeal erythema present. No oropharyngeal exudate.  Eyes:     General:        Right eye: No discharge.        Left eye: No discharge.     Extraocular Movements: Extraocular movements intact.     Conjunctiva/sclera: Conjunctivae normal.     Pupils: Pupils are equal, round, and reactive to light.  Cardiovascular:     Rate and Rhythm: Normal rate and regular rhythm.  Heart sounds: No murmur heard. Pulmonary:     Effort: Pulmonary effort is normal. No respiratory distress.     Breath sounds: Normal breath sounds. No wheezing or rales.  Musculoskeletal:     Cervical back: Normal range of motion and neck supple.  Skin:    General: Skin is warm and dry.     Capillary Refill: Capillary refill takes less than 2 seconds.  Neurological:     General: No focal deficit present.     Mental Status: She is alert and oriented to person, place, and time. Mental status is at baseline.  Psychiatric:        Mood and Affect: Mood normal.        Behavior: Behavior normal.        Thought Content: Thought content normal.         Judgment: Judgment normal.     Results for orders placed or performed in visit on 07/09/22  Urine Culture   Specimen: Urine   UR  Result Value Ref Range   Urine Culture, Routine Final report    Organism ID, Bacteria Comment   Microscopic Examination   Urine  Result Value Ref Range   WBC, UA 0-5 0 - 5 /hpf   RBC, Urine 0-2 0 - 2 /hpf   Epithelial Cells (non renal) 0-10 0 - 10 /hpf   Mucus, UA Present (A) Not Estab.   Bacteria, UA None seen None seen/Few  Urinalysis, Routine w reflex microscopic  Result Value Ref Range   Specific Gravity, UA 1.015 1.005 - 1.030   pH, UA 7.0 5.0 - 7.5   Color, UA Yellow Yellow   Appearance Ur Clear Clear   Leukocytes,UA Trace (A) Negative   Protein,UA Negative Negative/Trace   Glucose, UA Negative Negative   Ketones, UA Negative Negative   RBC, UA Trace (A) Negative   Bilirubin, UA Negative Negative   Urobilinogen, Ur 0.2 0.2 - 1.0 mg/dL   Nitrite, UA Negative Negative   Microscopic Examination See below:       Assessment & Plan:   Problem List Items Addressed This Visit   None Visit Diagnoses     Acute non-recurrent frontal sinusitis    -  Primary   Complete course of antibiotics. Continue with otc symptom management. Stay well hydrated.  Follow up if symptoms do not improve.   Relevant Medications   amoxicillin (AMOXIL) 500 MG capsule   guaiFENesin-codeine 100-10 MG/5ML syrup        Follow up plan: No follow-ups on file.

## 2022-09-30 ENCOUNTER — Ambulatory Visit
Admission: EM | Admit: 2022-09-30 | Discharge: 2022-09-30 | Disposition: A | Discharge status: Home / Self Care | Payer: 59 | Attending: Physician Assistant | Admitting: Physician Assistant

## 2022-09-30 ENCOUNTER — Encounter: Payer: Self-pay | Admitting: Emergency Medicine

## 2022-09-30 ENCOUNTER — Ambulatory Visit (INDEPENDENT_AMBULATORY_CARE_PROVIDER_SITE_OTHER): Payer: 59

## 2022-09-30 DIAGNOSIS — J040 Acute laryngitis: Secondary | ICD-10-CM | POA: Diagnosis not present

## 2022-09-30 DIAGNOSIS — J208 Acute bronchitis due to other specified organisms: Secondary | ICD-10-CM

## 2022-09-30 DIAGNOSIS — R062 Wheezing: Secondary | ICD-10-CM

## 2022-09-30 DIAGNOSIS — R059 Cough, unspecified: Secondary | ICD-10-CM

## 2022-09-30 DIAGNOSIS — R509 Fever, unspecified: Secondary | ICD-10-CM

## 2022-09-30 DIAGNOSIS — R051 Acute cough: Secondary | ICD-10-CM

## 2022-09-30 DIAGNOSIS — I10 Essential (primary) hypertension: Secondary | ICD-10-CM

## 2022-09-30 MED ORDER — ALBUTEROL SULFATE HFA 108 (90 BASE) MCG/ACT IN AERS: 1.0000 | INHALATION_SPRAY | Freq: Four times a day (QID) | RESPIRATORY_TRACT | 0 refills | Status: DC | PRN

## 2022-09-30 MED ORDER — CHERATUSSIN AC 100-10 MG/5ML PO SOLN: 10.0000 mL | Freq: Three times a day (TID) | ORAL | 0 refills | Status: DC | PRN

## 2022-09-30 NOTE — ED Triage Notes (Signed)
Pt c/o cough, wheezing, hoarseness. Started a little over a week ago. She states she has had a low grade fever of 99.7

## 2022-09-30 NOTE — ED Provider Notes (Signed)
[facility]    CSN: [id number] Arrival date & time: 09/30/22  0850      History   Chief Complaint Chief Complaint  Patient presents with   Cough   Wheezing    HPI Victoria Bowers is a 52 y.o. female presenting for 1 week history of cough, congestion, wheezing and voice hoarseness.  Also reports occasional shortness of breath.  She denies fever. Reports temps up to 99.7 degrees max. No report of nasal congestion, sinus pain, sore throat, ear pain, chest pain. No sick contacts or known exposure to COVID or flu reported. Has tried OTC cough medications.  Reports that she had a sinus infection last month and was given antibiotics and cough medicine.  Medical history is significant for hypertension, obesity, hyperlipidemia. No history of cardiopulmonary disease.   HPI  Past Medical History:  Diagnosis Date   Allergy    Bertolotti's syndrome    Complication of anesthesia    Fatty liver    Fibroid    GERD (gastroesophageal reflux disease)    OCC   Hypertension    Ovarian cyst    PONV (postoperative nausea and vomiting)    NAUSEATED    Patient Active Problem List   Diagnosis Date Noted   Hypercholesterolemia 02/11/2021   Leukocytosis    Obesity, Class III, BMI 40-49.9 (morbid obesity) ([place])    Acute diverticulitis 09/09/2020   Acute lower UTI 09/09/2020   Disorder of skin of trunk 03/18/2018   Displacement of lumbar intervertebral disc without myelopathy 03/18/2018   Inflammation of sacroiliac joint ([place]) 03/18/2018   Low back pain 03/18/2018   Vitamin D deficiency 05/15/2017   DUB (dysfunctional uterine bleeding) 03/21/2016   Left ovarian cyst 03/19/2016   Abnormal bleeding in menstrual cycle 03/10/2016   Left elbow tendonitis 11/28/2015   Fatty liver 07/16/2015   Migraine 07/16/2015   Essential hypertension 03/09/2015   Obesity 03/09/2015    Past Surgical History:  Procedure Laterality Date   CERVICAL POLYPECTOMY     CHOLECYSTECTOMY      COLONOSCOPY WITH PROPOFOL N/A 11/26/2020   Procedure: COLONOSCOPY WITH PROPOFOL;  Surgeon: Jonathon Bellows, MD;  Location: [facility] ENDOSCOPY;  Service: Gastroenterology;  Laterality: N/A;   COLONOSCOPY WITH PROPOFOL N/A 12/17/2020   Procedure: COLONOSCOPY WITH PROPOFOL;  Surgeon: Jonathon Bellows, MD;  Location: [facility] ENDOSCOPY;  Service: Gastroenterology;  Laterality: N/A;   HYSTEROSCOPY WITH D & C N/A 03/28/2019   Procedure: DILATATION AND CURETTAGE /HYSTEROSCOPY;  Surgeon: Harlin Heys, MD;  Location: [facility] ORS;  Service: Gynecology;  Laterality: N/A;   INTRAUTERINE DEVICE (IUD) INSERTION N/A 03/28/2019   Procedure: INTRAUTERINE DEVICE (IUD) INSERTION;  Surgeon: Harlin Heys, MD;  Location: [facility] ORS;  Service: Gynecology;  Laterality: N/A;   NASAL SINUS SURGERY  X2   SPINE SURGERY  ruptured disc   LUMBAR   TONSILLECTOMY     TONSILLECTOMY AND ADENOIDECTOMY  1985    OB History     Gravida  0   Para  0   Term  0   Preterm  0   AB  0   Living  0      SAB  0   IAB  0   Ectopic  0   Multiple  0   Live Births  0            Home Medications    Prior to Admission medications   Medication Sig Start Date End Date Taking? Authorizing Provider  albuterol (VENTOLIN HFA) 108 (  90 Base) MCG/ACT inhaler Inhale 1-2 puffs into the lungs every 6 (six) hours as needed for wheezing or shortness of breath. 09/30/22  Yes Laurene Footman B, PA-C  amLODipine (NORVASC) 5 MG tablet Take 1 tablet (5 mg total) by mouth daily. 02/17/22  Yes Jon Billings, NP  atenolol (TENORMIN) 25 MG tablet Take 1 tablet (25 mg total) by mouth daily. 02/17/22  Yes Jon Billings, NP  fluticasone (FLONASE) 50 MCG/ACT nasal spray Place 2 sprays into both nostrils every morning.   Yes [provider]  guaiFENesin-codeine (CHERATUSSIN AC) 100-10 MG/5ML syrup Take 10 mLs by mouth 3 (three) times daily as needed for cough. 09/30/22  Yes Danton Clap, PA-C  levonorgestrel (MIRENA) 20 MCG/24HR IUD 1 each by  Intrauterine route once.   Yes [provider]  olmesartan (BENICAR) 40 MG tablet Take 1 tablet (40 mg total) by mouth daily. 02/17/22  Yes Jon Billings, NP  nitrofurantoin, macrocrystal-monohydrate, (MACROBID) 100 MG capsule Take 1 capsule (100 mg total) by mouth 2 (two) times daily. 07/09/22   Jon Billings, NP    Family History Family History  Problem Relation Age of Onset   Hypertension Father    Hypertension Brother    Hypertension Paternal Grandmother    Hypertension Brother    Heart disease Mother     Social History Social History   Tobacco Use   Smoking status: Never   Smokeless tobacco: Never  Vaping Use   Vaping Use: Never used  Substance Use Topics   Alcohol use: No    Alcohol/week: 0.0 standard drinks of alcohol   Drug use: No     Allergies   Elemental sulfur and Sulfa antibiotics   Review of Systems Review of Systems  Constitutional:  Positive for fatigue. Negative for chills, diaphoresis and fever.  HENT:  Positive for congestion and voice change. Negative for ear pain, rhinorrhea, sinus pressure, sinus pain and sore throat.   Respiratory:  Positive for cough, shortness of breath and wheezing.   Gastrointestinal:  Negative for abdominal pain, nausea and vomiting.  Musculoskeletal:  Negative for arthralgias and myalgias.  Skin:  Negative for rash.  Neurological:  Negative for weakness and headaches.  Hematological:  Negative for adenopathy.     Physical Exam Triage Vital Signs ED Triage Vitals  Enc Vitals Group     BP      Pulse      Resp      Temp      Temp src      SpO2      Weight      Height      Head Circumference      Peak Flow      Pain Score      Pain Loc      Pain Edu?      Excl. in [place]?    No data found.  Updated Vital Signs BP (!) 162/91 (BP Location: Right Arm)   Pulse 70   Temp 98.9 F (37.2 C) (Oral)   Resp 16   SpO2 99%      Physical Exam Vitals and nursing note reviewed.  Constitutional:       General: She is not in acute distress.    Appearance: Normal appearance. She is not ill-appearing or toxic-appearing.  HENT:     Head: Normocephalic and atraumatic.     Nose: Congestion present.     Mouth/Throat:     Mouth: Mucous membranes are moist.     Pharynx:  Oropharynx is clear.  Eyes:     General: No scleral icterus.       Right eye: No discharge.        Left eye: No discharge.     Conjunctiva/sclera: Conjunctivae normal.  Cardiovascular:     Rate and Rhythm: Normal rate and regular rhythm.     Heart sounds: Normal heart sounds.  Pulmonary:     Effort: Pulmonary effort is normal. No respiratory distress.     Breath sounds: Normal breath sounds. No wheezing, rhonchi or rales.  Musculoskeletal:     Cervical back: Neck supple.  Skin:    General: Skin is dry.  Neurological:     General: No focal deficit present.     Mental Status: She is alert. Mental status is at baseline.     Motor: No weakness.     Gait: Gait normal.  Psychiatric:        Mood and Affect: Mood normal.        Behavior: Behavior normal.        Thought Content: Thought content normal.      UC Treatments / Results  Labs (all labs ordered are listed, but only abnormal results are displayed) Labs Reviewed - No data to display  EKG   Radiology DG Chest 2 View  Result Date: 09/30/2022 CLINICAL DATA:  Cough congestion wheezing for 1 week. Low-grade fever. EXAM: CHEST - 2 VIEW COMPARISON:  None Available. FINDINGS: The heart size and mediastinal contours are within normal limits. Both lungs are clear. The visualized skeletal structures are unremarkable. IMPRESSION: No active cardiopulmonary disease. Electronically Signed   By: Keane Police D.O.   On: 09/30/2022 10:18    Procedures Procedures (including critical care time)  Medications Ordered in UC Medications - No data to display  Initial Impression / Assessment and Plan / UC Course  I have reviewed the triage vital signs and the nursing  notes.  Pertinent labs & imaging results that were available during my care of the patient were reviewed by me and considered in my medical decision making (see chart for details).   52 y/o female presents for 1 week history of cough, congestion, voice hoarseness, wheezing.   BP elevated at 172/99. Other vitals normal and stable. She is in no acute distress. She has mild congestion on exam. Chest is clear to auscultation and heart RRR.   CXR obtained to assess for possible pneumonia.  X-ray normal.  Discussed results with patient.  Recheck of BP is 162/91.  Continue BP medicine.  Suspect viral bronchitis.  Prescribed Cheratussin and albuterol.  Explained to her that bronchitis can last for several weeks but she should return if she develops a fever or has increased shortness of breath.   Final Clinical Impressions(s) / UC Diagnoses   Final diagnoses:  Acute viral bronchitis  Laryngitis  Wheezing  Acute cough  Essential hypertension     Discharge Instructions      -Chest x-ray is normal.  You do not have pneumonia. - You have viral bronchitis.  As we discussed this can last for 2 to 6 weeks. -As long as you are not having any temps above 101 or increased shortness of breath and may just take some time for your symptoms to resolve.  I have sent cough medication and inhaler to the pharmacy.  Plenty rest and fluids.  Follow-up as needed.     ED Prescriptions     Medication Sig Dispense Auth. Provider   guaiFENesin-codeine [facility]  AC) 100-10 MG/5ML syrup Take 10 mLs by mouth 3 (three) times daily as needed for cough. 120 mL Laurene Footman B, PA-C   albuterol (VENTOLIN HFA) 108 (90 Base) MCG/ACT inhaler Inhale 1-2 puffs into the lungs every 6 (six) hours as needed for wheezing or shortness of breath. 1 g Danton Clap, PA-C      I have reviewed the PDMP during this encounter.   Danton Clap, PA-C 09/30/22 1039

## 2022-09-30 NOTE — Discharge Instructions (Signed)
-  Chest x-ray is normal.  You do not have pneumonia. - You have viral bronchitis.  As we discussed this can last for 2 to 6 weeks. -As long as you are not having any temps above 101 or increased shortness of breath and may just take some time for your symptoms to resolve.  I have sent cough medication and inhaler to the pharmacy.  Plenty rest and fluids.  Follow-up as needed.

## 2022-10-01 NOTE — Progress Notes (Deleted)
There were no vitals taken for this visit.   Subjective:    Patient ID: Victoria Bowers, female    DOB: 02-20-1971, 52 y.o.   MRN: [id number]  HPI: Victoria Bowers is a 52 y.o. female  No chief complaint on file.  HYPERTENSION / HYPERLIPIDEMIA Satisfied with current treatment? no Duration of hypertension: years BP monitoring frequency: not checking BP range:  BP medication side effects: no Past BP meds: amlodipine, atenolol, and olmesartan (benicar) Duration of hyperlipidemia: years Cholesterol medication side effects: no Cholesterol supplements: none Past cholesterol medications: none Medication compliance: excellent compliance Aspirin: no Recent stressors: no Recurrent headaches: no Visual changes: no Palpitations: no Dyspnea: no Chest pain: no Lower extremity edema: no Dizzy/lightheaded: no     Relevant past medical, surgical, family and social history reviewed and updated as indicated. Interim medical history since our last visit reviewed. Allergies and medications reviewed and updated.  Review of Systems  Constitutional:  Positive for fatigue. Negative for fever.  HENT:  Positive for congestion, ear pain, postnasal drip, rhinorrhea, sinus pressure, sinus pain, sneezing and sore throat. Negative for dental problem.   Eyes:  Negative for visual disturbance.  Respiratory:  Negative for cough, chest tightness, shortness of breath and wheezing.   Cardiovascular:  Negative for chest pain, palpitations and leg swelling.  Gastrointestinal:  Negative for vomiting.  Skin:  Negative for rash.  Neurological:  Positive for headaches. Negative for dizziness.    Per HPI unless specifically indicated above     Objective:    There were no vitals taken for this visit.  Wt Readings from Last 3 Encounters:  08/18/22 244 lb 4.8 oz (110.8 kg)  07/09/22 245 lb 1.6 oz (111.2 kg)  03/31/22 245 lb 6.4 oz (111.3 kg)    Physical Exam Vitals and nursing note reviewed.   Constitutional:      General: She is not in acute distress.    Appearance: Normal appearance. She is normal weight. She is not ill-appearing, toxic-appearing or diaphoretic.  HENT:     Head: Normocephalic.     Right Ear: Tympanic membrane and external ear normal.     Left Ear: External ear normal.     Nose: Congestion and rhinorrhea present.     Right Sinus: Maxillary sinus tenderness and frontal sinus tenderness present.     Left Sinus: Maxillary sinus tenderness and frontal sinus tenderness present.     Mouth/Throat:     Mouth: Mucous membranes are moist.     Pharynx: Oropharynx is clear. Posterior oropharyngeal erythema present. No oropharyngeal exudate.  Eyes:     General:        Right eye: No discharge.        Left eye: No discharge.     Extraocular Movements: Extraocular movements intact.     Conjunctiva/sclera: Conjunctivae normal.     Pupils: Pupils are equal, round, and reactive to light.  Cardiovascular:     Rate and Rhythm: Normal rate and regular rhythm.     Heart sounds: No murmur heard. Pulmonary:     Effort: Pulmonary effort is normal. No respiratory distress.     Breath sounds: Normal breath sounds. No wheezing or rales.  Musculoskeletal:     Cervical back: Normal range of motion and neck supple.  Skin:    General: Skin is warm and dry.     Capillary Refill: Capillary refill takes less than 2 seconds.  Neurological:     General: No focal deficit present.  Mental Status: She is alert and oriented to person, place, and time. Mental status is at baseline.  Psychiatric:        Mood and Affect: Mood normal.        Behavior: Behavior normal.        Thought Content: Thought content normal.        Judgment: Judgment normal.     Results for orders placed or performed in visit on 07/09/22  Urine Culture   Specimen: Urine   UR  Result Value Ref Range   Urine Culture, Routine Final report    Organism ID, Bacteria Comment   Microscopic Examination   Urine   Result Value Ref Range   WBC, UA 0-5 0 - 5 /hpf   RBC, Urine 0-2 0 - 2 /hpf   Epithelial Cells (non renal) 0-10 0 - 10 /hpf   Mucus, UA Present (A) Not Estab.   Bacteria, UA None seen None seen/Few  Urinalysis, Routine w reflex microscopic  Result Value Ref Range   Specific Gravity, UA 1.015 1.005 - 1.030   pH, UA 7.0 5.0 - 7.5   Color, UA Yellow Yellow   Appearance Ur Clear Clear   Leukocytes,UA Trace (A) Negative   Protein,UA Negative Negative/Trace   Glucose, UA Negative Negative   Ketones, UA Negative Negative   RBC, UA Trace (A) Negative   Bilirubin, UA Negative Negative   Urobilinogen, Ur 0.2 0.2 - 1.0 mg/dL   Nitrite, UA Negative Negative   Microscopic Examination See below:       Assessment & Plan:   Problem List Items Addressed This Visit       Cardiovascular and Mediastinum   Essential hypertension - Primary     Other   Vitamin D deficiency   Obesity, Class III, BMI 40-49.9 (morbid obesity) (HCC)   Hypercholesterolemia     Follow up plan: No follow-ups on file.

## 2022-10-02 ENCOUNTER — Ambulatory Visit: Payer: 59 | Admitting: Nurse Practitioner

## 2022-10-12 ENCOUNTER — Ambulatory Visit (INDEPENDENT_AMBULATORY_CARE_PROVIDER_SITE_OTHER): Payer: 59

## 2022-10-12 ENCOUNTER — Ambulatory Visit
Admission: EM | Admit: 2022-10-12 | Discharge: 2022-10-12 | Disposition: A | Discharge status: Home / Self Care | Payer: 59 | Attending: Physician Assistant | Admitting: Physician Assistant

## 2022-10-12 ENCOUNTER — Encounter: Payer: Self-pay | Admitting: Emergency Medicine

## 2022-10-12 DIAGNOSIS — J329 Chronic sinusitis, unspecified: Secondary | ICD-10-CM | POA: Diagnosis not present

## 2022-10-12 DIAGNOSIS — J40 Bronchitis, not specified as acute or chronic: Secondary | ICD-10-CM | POA: Diagnosis not present

## 2022-10-12 MED ORDER — AMOXICILLIN-POT CLAVULANATE 875-125 MG PO TABS: 1.0000 | ORAL_TABLET | Freq: Two times a day (BID) | ORAL | 0 refills | Status: DC

## 2022-10-12 MED ORDER — ONDANSETRON 4 MG PO TBDP: 4.0000 mg | ORAL_TABLET | Freq: Three times a day (TID) | ORAL | 0 refills | Status: DC | PRN

## 2022-10-12 NOTE — ED Provider Notes (Signed)
[facility]    CSN: [id number] Arrival date & time: 10/12/22  1338      History   Chief Complaint Chief Complaint  Patient presents with   Cough    HPI Victoria Bowers is a 52 y.o. female.   Patient presents today with a several week history of URI symptoms including cough and congestion symptoms 09/22/2022.  Reports that she was seen here a week after symptoms began and diagnosed with viral bronchitis at which point she was given albuterol and Cheratussin.  Reports that she completed these medications and continues to have symptoms.  Denies any recent antibiotics or steroids.  She denies history of allergies, asthma, COPD, smoking.  Denies history of diabetes.  Reports that recently her symptoms have worsened prompting reevaluation.  She denies any chest pain, shortness of breath but does report that she is having posttussive emesis.  She has had a decreased appetite since symptoms began and has been forcing herself to eat and drink.    Past Medical History:  Diagnosis Date   Allergy    Bertolotti's syndrome    Complication of anesthesia    Fatty liver    Fibroid    GERD (gastroesophageal reflux disease)    OCC   Hypertension    Ovarian cyst    PONV (postoperative nausea and vomiting)    NAUSEATED    Patient Active Problem List   Diagnosis Date Noted   Hypercholesterolemia 02/11/2021   Leukocytosis    Obesity, Class III, BMI 40-49.9 (morbid obesity) ([place])    Acute diverticulitis 09/09/2020   Acute lower UTI 09/09/2020   Disorder of skin of trunk 03/18/2018   Displacement of lumbar intervertebral disc without myelopathy 03/18/2018   Inflammation of sacroiliac joint ([place]) 03/18/2018   Low back pain 03/18/2018   Vitamin D deficiency 05/15/2017   DUB (dysfunctional uterine bleeding) 03/21/2016   Left ovarian cyst 03/19/2016   Abnormal bleeding in menstrual cycle 03/10/2016   Left elbow tendonitis 11/28/2015   Fatty liver 07/16/2015   Migraine  07/16/2015   Essential hypertension 03/09/2015    Past Surgical History:  Procedure Laterality Date   CERVICAL POLYPECTOMY     CHOLECYSTECTOMY     COLONOSCOPY WITH PROPOFOL N/A 11/26/2020   Procedure: COLONOSCOPY WITH PROPOFOL;  Surgeon: Jonathon Bellows, MD;  Location: [facility] ENDOSCOPY;  Service: Gastroenterology;  Laterality: N/A;   COLONOSCOPY WITH PROPOFOL N/A 12/17/2020   Procedure: COLONOSCOPY WITH PROPOFOL;  Surgeon: Jonathon Bellows, MD;  Location: [facility] ENDOSCOPY;  Service: Gastroenterology;  Laterality: N/A;   HYSTEROSCOPY WITH D & C N/A 03/28/2019   Procedure: DILATATION AND CURETTAGE /HYSTEROSCOPY;  Surgeon: Harlin Heys, MD;  Location: [facility] ORS;  Service: Gynecology;  Laterality: N/A;   INTRAUTERINE DEVICE (IUD) INSERTION N/A 03/28/2019   Procedure: INTRAUTERINE DEVICE (IUD) INSERTION;  Surgeon: Harlin Heys, MD;  Location: [facility] ORS;  Service: Gynecology;  Laterality: N/A;   NASAL SINUS SURGERY  X2   SPINE SURGERY  ruptured disc   LUMBAR   TONSILLECTOMY     TONSILLECTOMY AND ADENOIDECTOMY  1985    OB History     Gravida  0   Para  0   Term  0   Preterm  0   AB  0   Living  0      SAB  0   IAB  0   Ectopic  0   Multiple  0   Live Births  0  Home Medications    Prior to Admission medications   Medication Sig Start Date End Date Taking? Authorizing Provider  amoxicillin-clavulanate (AUGMENTIN) 875-125 MG tablet Take 1 tablet by mouth every 12 (twelve) hours. 10/12/22  Yes Elya Diloreto K, PA-C  ondansetron (ZOFRAN-ODT) 4 MG disintegrating tablet Take 1 tablet (4 mg total) by mouth every 8 (eight) hours as needed for nausea or vomiting. 10/12/22  Yes Kelee Cunningham K, PA-C  albuterol (VENTOLIN HFA) 108 (90 Base) MCG/ACT inhaler Inhale 1-2 puffs into the lungs every 6 (six) hours as needed for wheezing or shortness of breath. 09/30/22   Laurene Footman B, PA-C  amLODipine (NORVASC) 5 MG tablet Take 1 tablet (5 mg total) by mouth daily. 02/17/22    Jon Billings, NP  atenolol (TENORMIN) 25 MG tablet Take 1 tablet (25 mg total) by mouth daily. 02/17/22   Jon Billings, NP  fluticasone (FLONASE) 50 MCG/ACT nasal spray Place 2 sprays into both nostrils every morning.    [provider]  guaiFENesin-codeine (CHERATUSSIN AC) 100-10 MG/5ML syrup Take 10 mLs by mouth 3 (three) times daily as needed for cough. 09/30/22   Danton Clap, PA-C  levonorgestrel (MIRENA) 20 MCG/24HR IUD 1 each by Intrauterine route once.    [provider]  nitrofurantoin, macrocrystal-monohydrate, (MACROBID) 100 MG capsule Take 1 capsule (100 mg total) by mouth 2 (two) times daily. 07/09/22   Jon Billings, NP  olmesartan (BENICAR) 40 MG tablet Take 1 tablet (40 mg total) by mouth daily. 02/17/22   Jon Billings, NP    Family History Family History  Problem Relation Age of Onset   Hypertension Father    Hypertension Brother    Hypertension Paternal Grandmother    Hypertension Brother    Heart disease Mother     Social History Social History   Tobacco Use   Smoking status: Never   Smokeless tobacco: Never  Vaping Use   Vaping Use: Never used  Substance Use Topics   Alcohol use: No    Alcohol/week: 0.0 standard drinks of alcohol   Drug use: No     Allergies   Elemental sulfur and Sulfa antibiotics   Review of Systems Review of Systems  Constitutional:  Positive for activity change, fatigue and fever. Negative for appetite change.  HENT:  Positive for congestion, sinus pressure and sore throat. Negative for sneezing.   Respiratory:  Positive for cough. Negative for shortness of breath.   Cardiovascular:  Negative for chest pain.  Gastrointestinal:  Positive for diarrhea, nausea and vomiting. Negative for abdominal pain.  Neurological:  Negative for dizziness, light-headedness and headaches.     Physical Exam Triage Vital Signs ED Triage Vitals  Enc Vitals Group     BP 10/12/22 1452 (!) 141/89     Pulse Rate  10/12/22 1452 78     Resp 10/12/22 1452 15     Temp 10/12/22 1452 99.1 F (37.3 C)     Temp Source 10/12/22 1452 Oral     SpO2 10/12/22 1452 96 %     Weight 10/12/22 1450 240 lb (108.9 kg)     Height 10/12/22 1450 5' 2.5" (1.588 m)     Head Circumference --      Peak Flow --      Pain Score 10/12/22 1450 7     Pain Loc --      Pain Edu? --      Excl. in [place]? --    No data found.  Updated Vital Signs BP Marland Kitchen)  141/89 (BP Location: Left Arm)   Pulse 78   Temp 99.1 F (37.3 C) (Oral)   Resp 15   Ht 5' 2.5" (1.588 m)   Wt 240 lb (108.9 kg)   SpO2 96%   BMI 43.20 kg/m   Visual Acuity Right Eye Distance:   Left Eye Distance:   Bilateral Distance:    Right Eye Near:   Left Eye Near:    Bilateral Near:     Physical Exam Vitals reviewed.  Constitutional:      General: She is awake. She is not in acute distress.    Appearance: Normal appearance. She is well-developed. She is not ill-appearing.     Comments: Very pleasant female appears stated age in no acute distress sitting comfortably in exam room  HENT:     Head: Normocephalic and atraumatic.     Right Ear: Tympanic membrane, ear canal and external ear normal. Tympanic membrane is not erythematous or bulging.     Left Ear: Tympanic membrane, ear canal and external ear normal. Tympanic membrane is not erythematous or bulging.     Nose:     Right Sinus: No maxillary sinus tenderness or frontal sinus tenderness.     Left Sinus: No maxillary sinus tenderness or frontal sinus tenderness.     Mouth/Throat:     Pharynx: Uvula midline. Posterior oropharyngeal erythema present. No oropharyngeal exudate.     Comments: Drainage and erythema in posterior oropharynx Cardiovascular:     Rate and Rhythm: Normal rate and regular rhythm.     Heart sounds: Normal heart sounds, S1 normal and S2 normal. No murmur heard. Pulmonary:     Effort: Pulmonary effort is normal.     Breath sounds: Normal breath sounds. No wheezing, rhonchi or  rales.     Comments: Clear to auscultation bilaterally Psychiatric:        Behavior: Behavior is cooperative.      UC Treatments / Results  Labs (all labs ordered are listed, but only abnormal results are displayed) Labs Reviewed - No data to display  EKG   Radiology DG Chest 2 View  Result Date: 10/12/2022 CLINICAL DATA:  Cough for 4 weeks. EXAM: CHEST - 2 VIEW COMPARISON:  September 30, 2022 FINDINGS: The heart size and mediastinal contours are within normal limits. Both lungs are clear. The visualized skeletal structures are unremarkable. IMPRESSION: No active cardiopulmonary disease. Electronically Signed   By: Dorise Bullion III M.D.   On: 10/12/2022 15:40    Procedures Procedures (including critical care time)  Medications Ordered in UC Medications - No data to display  Initial Impression / Assessment and Plan / UC Course  I have reviewed the triage vital signs and the nursing notes.  Pertinent labs & imaging results that were available during my care of the patient were reviewed by me and considered in my medical decision making (see chart for details).     Patient is well-appearing, afebrile, nontoxic, nontachycardic.  No indication for viral testing as she has been symptomatic for several weeks.  Chest x-ray was obtained that showed no acute cardiopulmonary disease.  Concern for sinobronchitis secondary bacterial infection given prolonged and worsening symptoms.  She was started on Augmentin twice daily for 7 days.  Recommended she continue over-the-counter medication including Mucinex, Flonase, Tylenol.  She was given Zofran to help manage nausea.  Recommended that she eat a bland diet and drink plenty of fluid.  If her symptoms or not improving within a week she needs to  return for reevaluation.  Discussed that if she has any worsening symptoms including fever, chest pain, shortness of breath, nausea/vomiting interfering with oral intake she needs to be seen immediately.   Strict return precautions given.  Work excuse note provided.  Final Clinical Impressions(s) / UC Diagnoses   Final diagnoses:  Sinobronchitis     Discharge Instructions      Your chest x-ray was normal with no evidence of pneumonia.  Start Augmentin twice daily for 7 days.  Continue over-the-counter medications including Mucinex and Flonase.  Make sure you rest and drink plenty of fluid.  I have sent in a prescription for Zofran to be used as needed for nausea and vomiting symptoms.  Make sure that you eat small frequent meals and follow a bland diet.  If your symptoms or not improving within a few days or if anything worsens you need to be seen immediately.  If you develop any chest pain, shortness of breath, fever, nausea/vomiting interfering with oral intake you need to be seen immediately.     ED Prescriptions     Medication Sig Dispense Auth. Provider   ondansetron (ZOFRAN-ODT) 4 MG disintegrating tablet Take 1 tablet (4 mg total) by mouth every 8 (eight) hours as needed for nausea or vomiting. 20 tablet Karstyn Birkey K, PA-C   amoxicillin-clavulanate (AUGMENTIN) 875-125 MG tablet Take 1 tablet by mouth every 12 (twelve) hours. 14 tablet Tavi Hoogendoorn, Derry Skill, PA-C      PDMP not reviewed this encounter.   Terrilee Croak, PA-C 10/12/22 1606

## 2022-10-12 NOTE — Discharge Instructions (Signed)
Your chest x-ray was normal with no evidence of pneumonia.  Start Augmentin twice daily for 7 days.  Continue over-the-counter medications including Mucinex and Flonase.  Make sure you rest and drink plenty of fluid.  I have sent in a prescription for Zofran to be used as needed for nausea and vomiting symptoms.  Make sure that you eat small frequent meals and follow a bland diet.  If your symptoms or not improving within a few days or if anything worsens you need to be seen immediately.  If you develop any chest pain, shortness of breath, fever, nausea/vomiting interfering with oral intake you need to be seen immediately.

## 2022-10-12 NOTE — ED Triage Notes (Signed)
Patient c/o ongoing cough and chest congestion since Christmas.  Patient states that her cough has gotten worse and started having fever Friday night.  Patient reports coughing up lots of mucous.

## 2022-11-01 ENCOUNTER — Other Ambulatory Visit: Payer: Self-pay | Admitting: Nurse Practitioner

## 2022-11-01 DIAGNOSIS — I10 Essential (primary) hypertension: Secondary | ICD-10-CM

## 2022-11-03 NOTE — Telephone Encounter (Signed)
LVM asking patient to call back to schedule an appointment 

## 2022-11-03 NOTE — Telephone Encounter (Signed)
Patient is overdue for routine follow up visit. Please call to schedule the route to provider for refill.

## 2022-11-03 NOTE — Telephone Encounter (Signed)
Requested medication (s) are due for refill today: yes  Requested medication (s) are on the active medication list: yes  Last refill:  02/17/22 #90 1 refills  Future visit scheduled: no  Notes to clinic:  protocol failed last labs 02/17/22. Overdue encounter. Called patient to schedule appt for medication refills. No answer, LVTCB. Do you want to refill Rxs?     Requested Prescriptions  Pending Prescriptions Disp Refills   atenolol (TENORMIN) 25 MG tablet [Pharmacy Med Name: ATENOLOL 25 MG TABLET] 90 tablet 1    Sig: Take 1 tablet (25 mg total) by mouth daily.     Cardiovascular: Beta Blockers 2 Failed - 11/01/2022  8:25 AM      Failed - Last BP in normal range    BP Readings from Last 1 Encounters:  10/12/22 (!) 141/89         Passed - Cr in normal range and within 360 days    Creatinine  Date Value Ref Range Status  03/09/2013 0.92 0.60 - 1.30 mg/dL Final   Creatinine, Ser  Date Value Ref Range Status  02/17/2022 0.79 0.57 - 1.00 mg/dL Final         Passed - Last Heart Rate in normal range    Pulse Readings from Last 1 Encounters:  10/12/22 78         Passed - Valid encounter within last 6 months    Recent Outpatient Visits           2 months ago Acute non-recurrent frontal sinusitis   [place], NP   3 months ago Acute cystitis with hematuria   [place] Jon Billings, NP   7 months ago Obesity, Class III, BMI 40-49.9 (morbid obesity) ([place])   [place] Jon Billings, NP   8 months ago Annual physical exam   [place] Jon Billings, NP   1 year ago Obesity, Class III, BMI 40-49.9 (morbid obesity) ([place])   [place] Jon Billings, NP       Future Appointments             In 2 weeks Amalia Hailey, Nyoka Lint, MD [facility] at [facility]             olmesartan [facility]) 40 MG  tablet [Pharmacy Med Name: OLMESARTAN MEDOXOMIL 40 MG TAB] 90 tablet 1    Sig: TAKE 1 TABLET BY MOUTH EVERY DAY     Cardiovascular:  Angiotensin Receptor Blockers Failed - 11/01/2022  8:25 AM      Failed - Cr in normal range and within 180 days    Creatinine  Date Value Ref Range Status  03/09/2013 0.92 0.60 - 1.30 mg/dL Final   Creatinine, Ser  Date Value Ref Range Status  02/17/2022 0.79 0.57 - 1.00 mg/dL Final         Failed - K in normal range and within 180 days    Potassium  Date Value Ref Range Status  02/17/2022 4.6 3.5 - 5.2 mmol/L Final  03/09/2013 3.7 3.5 - 5.1 mmol/L Final         Failed - Last BP in normal range    BP Readings from Last 1 Encounters:  10/12/22 (!) 141/89         Passed - Patient is not pregnant      Passed - Valid encounter within last 6 months    Recent Outpatient Visits  2 months ago Acute non-recurrent frontal sinusitis   [place], NP   3 months ago Acute cystitis with hematuria   [place], NP   7 months ago Obesity, Class III, BMI 40-49.9 (morbid obesity) ([place])   [place] Jon Billings, NP   8 months ago Annual physical exam   [place] Jon Billings, NP   1 year ago Obesity, Class III, BMI 40-49.9 (morbid obesity) ([place])   [place] Jon Billings, NP       Future Appointments             In 2 weeks Amalia Hailey, Nyoka Lint, MD [place] [facility] at [facility]             amLODipine ([place]) 5 MG tablet [Pharmacy Med Name: AMLODIPINE BESYLATE 5 MG TAB] 90 tablet 1    Sig: Take 1 tablet (5 mg total) by mouth daily.     Cardiovascular: Calcium Channel Blockers 2 Failed - 11/01/2022  8:25 AM      Failed - Last BP in normal range    BP Readings from Last 1 Encounters:  10/12/22 (!) 141/89         Passed - Last Heart Rate in normal  range    Pulse Readings from Last 1 Encounters:  10/12/22 78         Passed - Valid encounter within last 6 months    Recent Outpatient Visits           2 months ago Acute non-recurrent frontal sinusitis   [place], NP   3 months ago Acute cystitis with hematuria   [place], NP   7 months ago Obesity, Class III, BMI 40-49.9 (morbid obesity) ([place])   [place], Karen, NP   8 months ago Annual physical exam   [place] Jon Billings, NP   1 year ago Obesity, Class III, BMI 40-49.9 (morbid obesity) ([place])   [place] Jon Billings, NP       Future Appointments             In 2 weeks Amalia Hailey, Nyoka Lint, MD [place] at [facility]

## 2022-11-03 NOTE — Telephone Encounter (Signed)
Called patient to schedule appt medication refills . No answer LVMTCB [phone].

## 2022-11-18 ENCOUNTER — Ambulatory Visit (INDEPENDENT_AMBULATORY_CARE_PROVIDER_SITE_OTHER): Payer: 59 | Admitting: Nurse Practitioner

## 2022-11-18 ENCOUNTER — Encounter: Payer: Self-pay | Admitting: Nurse Practitioner

## 2022-11-18 VITALS — BP 120/71 | HR 70 | Temp 99.4°F | Ht 62.5 in | Wt 252.6 lb

## 2022-11-18 DIAGNOSIS — I10 Essential (primary) hypertension: Secondary | ICD-10-CM

## 2022-11-18 DIAGNOSIS — E7800 Pure hypercholesterolemia, unspecified: Secondary | ICD-10-CM | POA: Diagnosis not present

## 2022-11-18 DIAGNOSIS — E66813 Obesity, class 3: Secondary | ICD-10-CM

## 2022-11-18 DIAGNOSIS — R7309 Other abnormal glucose: Secondary | ICD-10-CM

## 2022-11-18 NOTE — Assessment & Plan Note (Addendum)
Chronic. Stable. Lipid panel done today. ASCVD risk 2.4% which is considered low risk. Will benefit from nutrition modification and physical activity. Return in 6 months.

## 2022-11-18 NOTE — Assessment & Plan Note (Addendum)
Acute; stable. Last A1c was 6.3. Encouraged diet and exercise. Will benefit from GLP1 based on A1c levels to help control A1c and BMI. Return in 6 months.

## 2022-11-18 NOTE — Assessment & Plan Note (Signed)
Chronic. Stable. Controlled.  Continue with current medication regimen on Amlodipine 54m, Atenolol 273m and Olmesartan 4056mCMP ordered today. Will benefit from nutrition modification and physical activity. Return in 6 months.

## 2022-11-18 NOTE — Progress Notes (Signed)
BP 120/71   Pulse 70   Temp 99.4 F (37.4 C) (Oral)   Ht 5' 2.5" (1.588 m)   Wt 252 lb 9.6 oz (114.6 kg)   SpO2 97%   BMI 45.46 kg/m    Subjective:    Patient ID: [name], female    DOB: June 01, 1971, 51 y.o.   MRN: [id number]  HPI: Omunique Ostrand Giarrusso is a 52 y.o. female  [place], FNP.  ASSESSMENT AND PLAN OF CARE REVIEWED WITH STUDENT, AGREE WITH ABOVE FINDINGS AND PLAN.   Chief Complaint  Patient presents with   Hyperlipidemia   Hypertension   Diabetes   She has limited physical activity d/t deteriorated disc in her back. Seeing a chiropractor x1 week over 2 years, and is satisfied with regimen.   HYPERTENSION / HYPERLIPIDEMIA Taking amlodipine, olmesartan, atenolol Satisfied with current treatment? yes Duration of hypertension: chronic BP monitoring frequency: not checking BP range: Not checking BP medication side effects: no Duration of hyperlipidemia: Months Cholesterol medication side effects:  Not taking Cholesterol supplements: none, fish oil, niacin, and red yeast rice Medication compliance:  Not taking cholesterol medication Aspirin: no Recent stressors: no Recurrent headaches: no Visual changes: no Palpitations: no Dyspnea: no Chest pain: no Lower extremity edema: no Dizzy/lightheaded: no   The 10-year ASCVD risk score (Arnett DK, et al., 2019) is: 2.4%   Values used to calculate the score:     Age: 20 years     Sex: Female     Is Non-Hispanic African American: No     Diabetic: No     Tobacco smoker: No     Systolic Blood Pressure: 123456 mmHg     Is BP treated: Yes     HDL Cholesterol: 48 mg/dL     Total Cholesterol: 211 mg/dL   ELEVATED HEMOGLOBIN A1C Last A1C 6.3. Not taking any medications for this.  Hypoglycemic episodes:no Polydipsia/polyuria: no Visual disturbance: no Chest pain: no Paresthesias: no Glucose Monitoring:  Not checking Blood Pressure Monitoring: not checking Retinal Examination: Up to  Date Foot Exam:  Not applicable Diabetic Education: Completed Influenza:  Rejected Aspirin: no    Relevant past medical, surgical, family and social history reviewed and updated as indicated. Interim medical history since our last visit reviewed. Allergies and medications reviewed and updated.  Review of Systems  Eyes:  Negative for visual disturbance.  Respiratory:  Negative for shortness of breath.   Cardiovascular:  Negative for chest pain, palpitations and leg swelling.  Endocrine: Negative for polydipsia, polyphagia and polyuria.  Neurological:  Negative for dizziness, light-headedness and headaches.    Per HPI unless specifically indicated above     Objective:    BP 120/71   Pulse 70   Temp 99.4 F (37.4 C) (Oral)   Ht 5' 2.5" (1.588 m)   Wt 252 lb 9.6 oz (114.6 kg)   SpO2 97%   BMI 45.46 kg/m   Wt Readings from Last 3 Encounters:  11/18/22 252 lb 9.6 oz (114.6 kg)  10/12/22 240 lb (108.9 kg)  08/18/22 244 lb 4.8 oz (110.8 kg)    Physical Exam Vitals and nursing note reviewed.  Constitutional:      General: She is awake. She is not in acute distress.    Appearance: Normal appearance. She is well-developed and well-groomed. She is obese. She is not ill-appearing.  HENT:     Head: Normocephalic and atraumatic.     Right Ear: Hearing and external ear normal. No  drainage.     Left Ear: Hearing and external ear normal. No drainage.     Nose: Nose normal.  Eyes:     General: Lids are normal.        Right eye: No discharge.        Left eye: No discharge.     Conjunctiva/sclera: Conjunctivae normal.  Cardiovascular:     Rate and Rhythm: Normal rate and regular rhythm.     Pulses:          Carotid pulses are 2+ on the right side and 2+ on the left side.    Heart sounds: Normal heart sounds, S1 normal and S2 normal. No murmur heard.    No gallop.  Pulmonary:     Effort: No accessory muscle usage or respiratory distress.     Breath sounds: Normal breath sounds.   Musculoskeletal:        General: Normal range of motion.     Right lower leg: No edema.     Left lower leg: No edema.  Skin:    General: Skin is warm and dry.     Capillary Refill: Capillary refill takes less than 2 seconds.  Neurological:     Mental Status: She is alert and oriented to person, place, and time.  Psychiatric:        Attention and Perception: Attention normal.        Mood and Affect: Mood normal.        Speech: Speech normal.        Behavior: Behavior normal. Behavior is cooperative.        Thought Content: Thought content normal.     Results for orders placed or performed in visit on 07/09/22  Urine Culture   Specimen: Urine   UR  Result Value Ref Range   Urine Culture, Routine Final report    Organism ID, Bacteria Comment   Microscopic Examination   Urine  Result Value Ref Range   WBC, UA 0-5 0 - 5 /hpf   RBC, Urine 0-2 0 - 2 /hpf   Epithelial Cells (non renal) 0-10 0 - 10 /hpf   Mucus, UA Present (A) Not Estab.   Bacteria, UA None seen None seen/Few  Urinalysis, Routine w reflex microscopic  Result Value Ref Range   Specific Gravity, UA 1.015 1.005 - 1.030   pH, UA 7.0 5.0 - 7.5   Color, UA Yellow Yellow   Appearance Ur Clear Clear   Leukocytes,UA Trace (A) Negative   Protein,UA Negative Negative/Trace   Glucose, UA Negative Negative   Ketones, UA Negative Negative   RBC, UA Trace (A) Negative   Bilirubin, UA Negative Negative   Urobilinogen, Ur 0.2 0.2 - 1.0 mg/dL   Nitrite, UA Negative Negative   Microscopic Examination See below:       Assessment & Plan:   Problem List Items Addressed This Visit       Cardiovascular and Mediastinum   Essential hypertension - Primary    Chronic. Stable. Controlled.  Continue with current medication regimen on Amlodipine 58m, Atenolol 268m and Olmesartan 407mCMP ordered today. Will benefit from nutrition modification and physical activity. Return in 6 months.        Relevant Orders   Comprehensive  metabolic panel     Other   Obesity, Class III, BMI 40-49.9 (morbid obesity) (HCC)   Hypercholesterolemia    Chronic. Stable. Lipid panel done today. ASCVD risk 2.4% which is considered low risk. Will benefit from nutrition  modification and physical activity. Return in 6 months.       Relevant Orders   Lipid panel   Elevated hemoglobin A1c    Acute; stable. Last A1c was 6.3. Encouraged diet and exercise. Will benefit from GLP1 based on A1c levels to help control A1c and BMI. Return in 6 months.         Relevant Orders   Hemoglobin A1c   Comprehensive metabolic panel     Follow up plan: Return in about 6 months (around 05/19/2023) for HTN/HLD, elevated A1c.

## 2022-11-19 LAB — LIPID PANEL
Chol/HDL Ratio: 4.7 ratio — ABNORMAL HIGH (ref 0.0–4.4)
Cholesterol, Total: 198 mg/dL (ref 100–199)
HDL: 42 mg/dL (ref >39)
LDL Chol Calc (NIH): 114 mg/dL — ABNORMAL HIGH (ref 0–99)
Triglycerides: 240 mg/dL — ABNORMAL HIGH (ref 0–149)
VLDL Cholesterol Cal: 42 mg/dL — ABNORMAL HIGH (ref 5–40)

## 2022-11-19 LAB — SPECIMEN STATUS REPORT

## 2022-11-19 LAB — COMPREHENSIVE METABOLIC PANEL
ALT: 18 IU/L (ref 0–32)
AST: 14 IU/L (ref 0–40)
Albumin/Globulin Ratio: 1.6 (ref 1.2–2.2)
Albumin: 4.1 g/dL (ref 3.8–4.9)
Alkaline Phosphatase: 101 IU/L (ref 44–121)
BUN/Creatinine Ratio: 17 (ref 9–23)
BUN: 14 mg/dL (ref 6–24)
Bilirubin Total: 0.2 mg/dL (ref 0.0–1.2)
CO2: 22 mmol/L (ref 20–29)
Calcium: 9.6 mg/dL (ref 8.7–10.2)
Chloride: 99 mmol/L (ref 96–106)
Creatinine, Ser: 0.81 mg/dL (ref 0.57–1.00)
Globulin, Total: 2.6 g/dL (ref 1.5–4.5)
Glucose: 119 mg/dL — ABNORMAL HIGH (ref 70–99)
Potassium: 4.5 mmol/L (ref 3.5–5.2)
Sodium: 134 mmol/L (ref 134–144)
Total Protein: 6.7 g/dL (ref 6.0–8.5)
eGFR: 87 mL/min/{1.73_m2} (ref >59)

## 2022-11-19 LAB — HEMOGLOBIN A1C
Est. average glucose Bld gHb Est-mCnc: 140 mg/dL
Hgb A1c MFr Bld: 6.5 % — ABNORMAL HIGH (ref 4.8–5.6)

## 2022-11-19 MED ORDER — OZEMPIC (0.25 OR 0.5 MG/DOSE) 2 MG/1.5ML ~~LOC~~ SOPN: 0.2500 mg | PEN_INJECTOR | SUBCUTANEOUS | 0 refills | Status: DC

## 2022-11-19 NOTE — Addendum Note (Signed)
Addended by: Jon Billings on: 11/19/2022 10:57 AM   Modules accepted: Orders

## 2022-11-19 NOTE — Progress Notes (Signed)
Hi Victoria Bowers.  It was nice to see you yesterday.  Your lab work looks good.  Your A1c is now in the diabetic range.  I will send in a prescription for Ozempic to see if your insurance will cover it.  If you are able to get the medication we will follow up in 1 month to see how you are doing.  They may not since you aren't on other medication but it is worth a try.  No concerns at this time. Continue with your current medication regimen.  Follow up as discussed.  Please let me know if you have any questions.

## 2022-11-20 ENCOUNTER — Encounter: Payer: Self-pay | Admitting: Obstetrics and Gynecology

## 2022-11-20 ENCOUNTER — Ambulatory Visit (INDEPENDENT_AMBULATORY_CARE_PROVIDER_SITE_OTHER): Payer: 59 | Admitting: Obstetrics and Gynecology

## 2022-11-20 ENCOUNTER — Other Ambulatory Visit (HOSPITAL_COMMUNITY)
Admission: RE | Admit: 2022-11-20 | Discharge: 2022-11-20 | Disposition: A | Discharge status: Home / Self Care | Payer: 59 | Source: Ambulatory Visit | Attending: Obstetrics and Gynecology | Admitting: Obstetrics and Gynecology

## 2022-11-20 VITALS — BP 141/81 | HR 59 | Ht 62.5 in | Wt 252.0 lb

## 2022-11-20 DIAGNOSIS — Z01419 Encounter for gynecological examination (general) (routine) without abnormal findings: Secondary | ICD-10-CM | POA: Diagnosis present

## 2022-11-20 DIAGNOSIS — Z124 Encounter for screening for malignant neoplasm of cervix: Secondary | ICD-10-CM | POA: Insufficient documentation

## 2022-11-20 NOTE — Progress Notes (Signed)
HPI:      [name] is a 52 y.o. G0P0000 who LMP was No LMP recorded. (Menstrual status: IUD).  Subjective:   She presents today for her annual examination.  She has no complaints.  She states that she is no longer bleeding at all with her IUD.  She denies menopausal symptoms.  She is up-to-date on mammography and blood work.  She does need a Pap.    Hx: The following portions of the patient's history were reviewed and updated as appropriate:             She  has a past medical history of Allergy, Bertolotti's syndrome, Complication of anesthesia, Fatty liver, Fibroid, GERD (gastroesophageal reflux disease), Hypertension, Ovarian cyst, and PONV (postoperative nausea and vomiting). She does not have any pertinent problems on file. She  has a past surgical history that includes Cholecystectomy; Spine surgery (ruptured disc); Nasal sinus surgery (X2); Tonsillectomy and adenoidectomy (1985); Hysteroscopy with D & C (N/A, 03/28/2019); Intrauterine device (iud) insertion (N/A, 03/28/2019); Cervical polypectomy; Tonsillectomy; Colonoscopy with propofol (N/A, 11/26/2020); and Colonoscopy with propofol (N/A, 12/17/2020). Her family history includes Heart disease in her mother; Hypertension in her brother, brother, father, and paternal grandmother. She  reports that she has never smoked. She has never used smokeless tobacco. She reports that she does not drink alcohol and does not use drugs. She has a current medication list which includes the following prescription(s): amlodipine, atenolol, fluticasone, levonorgestrel, olmesartan, and ozempic (0.25 or 0.5 mg/dose). She is allergic to elemental sulfur and sulfa antibiotics.       Review of Systems:  Review of Systems  Constitutional: Denied constitutional symptoms, night sweats, recent illness, fatigue, fever, insomnia and weight loss.  Eyes: Denied eye symptoms, eye pain, photophobia, vision change and visual disturbance.   Ears/Nose/Throat/Neck: Denied ear, nose, throat or neck symptoms, hearing loss, nasal discharge, sinus congestion and sore throat.  Cardiovascular: Denied cardiovascular symptoms, arrhythmia, chest pain/pressure, edema, exercise intolerance, orthopnea and palpitations.  Respiratory: Denied pulmonary symptoms, asthma, pleuritic pain, productive sputum, cough, dyspnea and wheezing.  Gastrointestinal: Denied, gastro-esophageal reflux, melena, nausea and vomiting.  Genitourinary: Denied genitourinary symptoms including symptomatic vaginal discharge, pelvic relaxation issues, and urinary complaints.  Musculoskeletal: Denied musculoskeletal symptoms, stiffness, swelling, muscle weakness and myalgia.  Dermatologic: Denied dermatology symptoms, rash and scar.  Neurologic: Denied neurology symptoms, dizziness, headache, neck pain and syncope.  Psychiatric: Denied psychiatric symptoms, anxiety and depression.  Endocrine: Denied endocrine symptoms including hot flashes and night sweats.   Meds:   Current Outpatient Medications on File Prior to Visit  Medication Sig Dispense Refill   amLODipine (NORVASC) 5 MG tablet TAKE 1 TABLET (5 MG TOTAL) BY MOUTH DAILY. 90 tablet 1   atenolol (TENORMIN) 25 MG tablet TAKE 1 TABLET (25 MG TOTAL) BY MOUTH DAILY. 90 tablet 1   fluticasone (FLONASE) 50 MCG/ACT nasal spray Place 2 sprays into both nostrils every morning.     levonorgestrel (MIRENA) 20 MCG/24HR IUD 1 each by Intrauterine route once.     olmesartan (BENICAR) 40 MG tablet TAKE 1 TABLET BY MOUTH EVERY DAY 90 tablet 1   Semaglutide,0.25 or 0.5MG/DOS, (OZEMPIC, 0.25 OR 0.5 MG/DOSE,) 2 MG/1.5ML SOPN Inject 0.25 mg into the skin once a week. Start with 0.25MG once a week x 4 weeks, then increase to 0.5MG weekly. 1.5 mL 0   No current facility-administered medications on file prior to visit.     Objective:     Vitals:   11/20/22 0812  BP: Marland Kitchen)  141/81  Pulse: (!) 59    Filed Weights   11/20/22 0812   Weight: 252 lb (114.3 kg)              Physical examination General NAD, Conversant  HEENT Atraumatic; Op clear with mmm.  Normo-cephalic. Pupils reactive. Anicteric sclerae  Thyroid/Neck Smooth without nodularity or enlargement. Normal ROM.  Neck Supple.  Skin No rashes, lesions or ulceration. Normal palpated skin turgor. No nodularity.  Breasts: No masses or discharge.  Symmetric.  No axillary adenopathy.  Lungs: Clear to auscultation.No rales or wheezes. Normal Respiratory effort, no retractions.  Heart: NSR.  No murmurs or rubs appreciated. No peripheral edema  Abdomen: Soft.  Non-tender.  No masses.  No HSM. No hernia  Extremities: Moves all appropriately.  Normal ROM for age. No lymphadenopathy.  Neuro: Oriented to PPT.  Normal mood. Normal affect.     Pelvic:   Vulva: Normal appearance.  No lesions.  Vagina: No lesions or abnormalities noted.  Support: Normal pelvic support.  Urethra No masses tenderness or scarring.  Meatus Normal size without lesions or prolapse.  Cervix: Normal appearance.  No lesions.  IUD strings noted  Anus: Normal exam.  No lesions.  Perineum: Normal exam.  No lesions.        Bimanual   Uterus: Normal size.  Non-tender.  Mobile.  AV.  Adnexae: No masses.  Non-tender to palpation.  Cul-de-sac: Negative for abnormality.     Assessment:    G0P0000 Patient Active Problem List   Diagnosis Date Noted   Elevated hemoglobin A1c 11/18/2022   Hypercholesterolemia 02/11/2021   Leukocytosis    Obesity, Class III, BMI 40-49.9 (morbid obesity) ([place])    Acute diverticulitis 09/09/2020   Acute lower UTI 09/09/2020   Disorder of skin of trunk 03/18/2018   Displacement of lumbar intervertebral disc without myelopathy 03/18/2018   Inflammation of sacroiliac joint ([place]) 03/18/2018   Low back pain 03/18/2018   Vitamin D deficiency 05/15/2017   DUB (dysfunctional uterine bleeding) 03/21/2016   Left ovarian cyst 03/19/2016   Abnormal bleeding in menstrual  cycle 03/10/2016   Left elbow tendonitis 11/28/2015   Fatty liver 07/16/2015   Migraine 07/16/2015   Essential hypertension 03/09/2015     1. Well woman exam with routine gynecological exam   2. Cervical cancer screening        Plan:            1.  Basic Screening Recommendations The basic screening recommendations for asymptomatic women were discussed with the patient during her visit.  The age-appropriate recommendations were discussed with her and the rational for the tests reviewed.  When I am informed by the patient that another primary care physician has previously obtained the age-appropriate tests and they are up-to-date, only outstanding tests are ordered and referrals given as necessary.  Abnormal results of tests will be discussed with her when all of her results are completed.  Routine preventative health maintenance measures emphasized: Exercise/Diet/Weight control, Tobacco Warnings, Alcohol/Substance use risks and Stress Management Pap performed-up-to-date on mammography-up-to-date on blood work. 2.  IUD in place-no issues Orders No orders of the defined types were placed in this encounter.   No orders of the defined types were placed in this encounter.         F/U  Return in about 1 year (around 11/21/2023) for Annual Physical.  Finis Bud, M.D. 11/20/2022 8:45 AM

## 2022-11-20 NOTE — Progress Notes (Signed)
Patients presents for annual exam today. She states doing well with her IUD, states no spotting any longer. Patient is due for pap smear, ordered. Up to date with mammogram. Annual labs are ordered. She  states no other questions or concerns at this time.

## 2022-11-21 LAB — CYTOLOGY - PAP
Comment: NEGATIVE
Diagnosis: NEGATIVE
High risk HPV: NEGATIVE

## 2023-02-17 ENCOUNTER — Ambulatory Visit: Payer: 59 | Admitting: Nurse Practitioner

## 2023-04-06 ENCOUNTER — Encounter: Payer: Self-pay | Admitting: Physician Assistant

## 2023-04-06 ENCOUNTER — Ambulatory Visit (INDEPENDENT_AMBULATORY_CARE_PROVIDER_SITE_OTHER): Payer: 59 | Admitting: Physician Assistant

## 2023-04-06 VITALS — BP 89/44 | HR 60 | Temp 98.4°F | Ht 62.5 in | Wt 251.4 lb

## 2023-04-06 DIAGNOSIS — B9689 Other specified bacterial agents as the cause of diseases classified elsewhere: Secondary | ICD-10-CM | POA: Diagnosis not present

## 2023-04-06 DIAGNOSIS — J019 Acute sinusitis, unspecified: Secondary | ICD-10-CM

## 2023-04-06 MED ORDER — AMOXICILLIN-POT CLAVULANATE 875-125 MG PO TABS: 1.0000 | ORAL_TABLET | Freq: Two times a day (BID) | ORAL | 0 refills | Status: DC

## 2023-04-06 NOTE — Progress Notes (Signed)
Acute Office Visit   Patient: Victoria Bowers   DOB: [birth year]   52 y.o. Female  MRN: [id number] Visit Date: 04/06/2023  Today's healthcare provider: Oswaldo Conroy Melanny Wire, PA-C  Introduced myself to the patient as a Secondary school teacher and provided education on APPs in clinical practice.    Chief Complaint  Patient presents with   Headache   Cough   Fatigue   Sinus Problem    Patient says she has been not feeling well for the past two and half weeks. Patient says she has tried Mucinex and Benadryl Decongestant. Patient says she is having lingering sinus headache, cough from throat irritation, discoloration in Phlegm.    Subjective    HPI HPI     Sinus Problem    Additional comments: Patient says she has been not feeling well for the past two and half weeks. Patient says she has tried Mucinex and Benadryl Decongestant. Patient says she is having lingering sinus headache, cough from throat irritation, discoloration in Phlegm.       Last edited by Malen Gauze, CMA on 04/06/2023  9:22 AM.       Concern for URI  Onset: gradual Duration: ongoing for about 2.5 weeks  Associated Symptoms: malaise, cough, congestion and rhinorrhea, sinus pressure, headaches, fever (99.5), chills, ear fullness, SOB at night   Interventions: Mucinex, benadryl D  Recent sick contacts: denies recent sick contacts  Recent travel: none  COVID Testing at home : she has not tested herself for COVID    Medications: Outpatient Medications Prior to Visit  Medication Sig   amLODipine (NORVASC) 5 MG tablet TAKE 1 TABLET (5 MG TOTAL) BY MOUTH DAILY.   atenolol (TENORMIN) 25 MG tablet TAKE 1 TABLET (25 MG TOTAL) BY MOUTH DAILY.   fluticasone (FLONASE) 50 MCG/ACT nasal spray Place 2 sprays into both nostrils every morning.   levonorgestrel (MIRENA) 20 MCG/24HR IUD 1 each by Intrauterine route once.   olmesartan (BENICAR) 40 MG tablet TAKE 1 TABLET BY MOUTH EVERY DAY   Semaglutide,0.25 or 0.5MG /DOS, (OZEMPIC,  0.25 OR 0.5 MG/DOSE,) 2 MG/1.5ML SOPN Inject 0.25 mg into the skin once a week. Start with 0.25MG  once a week x 4 weeks, then increase to 0.5MG  weekly.   No facility-administered medications prior to visit.    Review of Systems  Constitutional:  Positive for chills, fatigue and fever.  HENT:  Positive for congestion, postnasal drip, rhinorrhea, sinus pressure and sinus pain. Negative for ear pain and sore throat.   Respiratory:  Positive for cough and shortness of breath. Negative for wheezing.   Gastrointestinal:  Negative for diarrhea, nausea and vomiting.  Musculoskeletal:  Negative for arthralgias and myalgias.  Neurological:  Positive for headaches. Negative for dizziness.       Objective    BP (!) 89/44   Pulse 60   Temp 98.4 F (36.9 C) (Oral)   Ht 5' 2.5" (1.588 m)   Wt 251 lb 6.4 oz (114 kg)   SpO2 98%   BMI 45.25 kg/m    Physical Exam Vitals reviewed.  Constitutional:      General: She is awake.     Appearance: Normal appearance. She is well-developed and well-groomed.  HENT:     Head: Normocephalic and atraumatic.     Right Ear: Hearing and ear canal normal. A middle ear effusion is present. Tympanic membrane is not injected, scarred, perforated, erythematous, retracted or bulging. Tympanic membrane has normal mobility.  Left Ear: Hearing and ear canal normal. A middle ear effusion is present. Tympanic membrane is not injected, scarred, perforated, erythematous, retracted or bulging. Tympanic membrane has normal mobility.     Nose: Nasal tenderness and congestion present.     Mouth/Throat:     Lips: Pink.     Mouth: Mucous membranes are moist.     Pharynx: Oropharynx is clear. Uvula midline. No oropharyngeal exudate or posterior oropharyngeal erythema.  Eyes:     General: Lids are normal. Gaze aligned appropriately.     Extraocular Movements: Extraocular movements intact.     Conjunctiva/sclera: Conjunctivae normal.     Pupils: Pupils are equal, round, and  reactive to light.  Cardiovascular:     Rate and Rhythm: Normal rate and regular rhythm.     Pulses: Normal pulses.          Radial pulses are 2+ on the right side and 2+ on the left side.     Heart sounds: Normal heart sounds. No murmur heard.    No friction rub. No gallop.  Pulmonary:     Effort: Pulmonary effort is normal.     Breath sounds: Normal breath sounds. No decreased air movement. No decreased breath sounds, wheezing, rhonchi or rales.  Musculoskeletal:     Cervical back: Normal range of motion and neck supple.     Right lower leg: No edema.     Left lower leg: No edema.  Lymphadenopathy:     Head:     Right side of head: No submental, submandibular or preauricular adenopathy.     Left side of head: No submental or preauricular adenopathy.     Cervical:     Right cervical: No superficial or posterior cervical adenopathy.    Left cervical: No superficial or posterior cervical adenopathy.     Upper Body:     Right upper body: No supraclavicular adenopathy.     Left upper body: No supraclavicular adenopathy.  Neurological:     Mental Status: She is alert.  Psychiatric:        Attention and Perception: Attention and perception normal.        Behavior: Behavior is cooperative.       No results found for any visits on 04/06/23.  Assessment & Plan      No follow-ups on file.        Problem List Items Addressed This Visit   None Visit Diagnoses     Acute bacterial sinusitis    -  Primary Acute, new concern Patient reports ongoing sinus pressure, headaches, nasal congestion, post nasal drainage, elevated temperatures and chills for the past 2 weeks that is not improving with home measures Given HPI and PE - suspect bacterial sinus infection Will start Augmentin 875-125 mg PO BID x 10 days Recommend OTC medications such as Flonase, second generation antihistamine, mucinex and cough medication per preference for further symptomatic relief Reviewed ED and return  precautions Follow up as needed for persistent or progressing symptoms     Relevant Medications   amoxicillin-clavulanate (AUGMENTIN) 875-125 MG tablet        No follow-ups on file.   I, Henritta Mutz E Chandria Rookstool, PA-C, have reviewed all documentation for this visit. The documentation on 04/06/23 for the exam, diagnosis, procedures, and orders are all accurate and complete.   Jacquelin Hawking, MHS, PA-C [facility]

## 2023-04-06 NOTE — Patient Instructions (Signed)
Based on your symptoms and duration of illness, I believe you may have a bacterial sinus infection  These typically resolve with antibiotic therapy along with at-home comfort measures  Today I have sent in a prescription for Augmentin 875-125 mg to be taken by mouth twice per day for 7 days FINISH THE ENTIRE COURSE unless you develop an allergic reaction or are instructed to discontinue.  It can take a few days for the antibiotic to kick in so I recommend symptomatic relief with over the counter medication such as the following: Dayquil/ Nyquil Theraflu Alkaseltzer   These medications typically have Tylenol in them already so you can take Ibuprofen as needed for further pain/ discomfort and fever management/ do not need to supplement with more outside of those medications  Stay well hydrated with at least 75 oz of water per day to help with recovery  I also recommend adding an antihistamine to your daily regimen This includes medications like Claritin, Allegra, Zyrtec- the generics of these work very well and are usually less expensive I recommend using Flonase nasal spray - 2 puffs twice per day to help with your nasal congestion The antihistamines and Flonase can take a few weeks to provide significant relief from allergy symptoms but should start to provide some benefit soon.  If you notice any of the following please let us know: increased fever not responding to Tylenol or Ibuprofen, swelling around your nose or eyes, difficulty seeing,

## 2023-04-29 ENCOUNTER — Other Ambulatory Visit: Payer: Self-pay | Admitting: Nurse Practitioner

## 2023-04-29 DIAGNOSIS — I10 Essential (primary) hypertension: Secondary | ICD-10-CM

## 2023-04-29 NOTE — Telephone Encounter (Signed)
Requested Prescriptions  Pending Prescriptions Disp Refills   amLODipine (NORVASC) 5 MG tablet [Pharmacy Med Name: AMLODIPINE BESYLATE 5 MG TAB] 90 tablet 0    Sig: TAKE 1 TABLET (5 MG TOTAL) BY MOUTH DAILY.     Cardiovascular: Calcium Channel Blockers 2 Failed - 04/29/2023  2:12 AM      Failed - Last BP in normal range    BP Readings from Last 1 Encounters:  04/06/23 (!) 89/44         Passed - Last Heart Rate in normal range    Pulse Readings from Last 1 Encounters:  04/06/23 60         Passed - Valid encounter within last 6 months    Recent Outpatient Visits           3 weeks ago Acute bacterial sinusitis   [place] [facility], Oswaldo Conroy, PA-C   5 months ago Essential hypertension   [place] [facility] [place], Clydie Braun, NP   8 months ago Acute non-recurrent frontal sinusitis   [place] [facility] Larae Grooms, NP   9 months ago Acute cystitis with hematuria   [facility] Larae Grooms, NP   1 year ago Obesity, Class III, BMI 40-49.9 (morbid obesity) (HCC)   [place] [facility] Larae Grooms, NP               atenolol (TENORMIN) 25 MG tablet [Pharmacy Med Name: ATENOLOL 25 MG TABLET] 90 tablet 0    Sig: TAKE 1 TABLET (25 MG TOTAL) BY MOUTH DAILY.     Cardiovascular: Beta Blockers 2 Failed - 04/29/2023  2:12 AM      Failed - Last BP in normal range    BP Readings from Last 1 Encounters:  04/06/23 (!) 89/44         Passed - Cr in normal range and within 360 days    Creatinine  Date Value Ref Range Status  03/09/2013 0.92 0.60 - 1.30 mg/dL Final   Creatinine, Ser  Date Value Ref Range Status  11/18/2022 0.81 0.57 - 1.00 mg/dL Final         Passed - Last Heart Rate in normal range    Pulse Readings from Last 1 Encounters:  04/06/23 60         Passed - Valid encounter within last 6 months    Recent Outpatient Visits           3 weeks ago Acute  bacterial sinusitis   [place] [facility], Oswaldo Conroy, PA-C   5 months ago Essential hypertension   [place] [facility] [place], Clydie Braun, NP   8 months ago Acute non-recurrent frontal sinusitis   [place] [facility] Larae Grooms, NP   9 months ago Acute cystitis with hematuria   [place] [facility] Larae Grooms, NP   1 year ago Obesity, Class III, BMI 40-49.9 (morbid obesity) (HCC)   [place] [facility] [place], Clydie Braun, NP               olmesartan (BENICAR) 40 MG tablet [Pharmacy Med Name: OLMESARTAN MEDOXOMIL 40 MG TAB] 90 tablet 0    Sig: TAKE 1 TABLET BY MOUTH EVERY DAY     Cardiovascular:  Angiotensin Receptor Blockers Failed - 04/29/2023  2:12 AM      Failed - Last BP in normal range    BP Readings from Last 1  Encounters:  04/06/23 (!) 89/44         Passed - Cr in normal range and within 180 days    Creatinine  Date Value Ref Range Status  03/09/2013 0.92 0.60 - 1.30 mg/dL Final   Creatinine, Ser  Date Value Ref Range Status  11/18/2022 0.81 0.57 - 1.00 mg/dL Final         Passed - K in normal range and within 180 days    Potassium  Date Value Ref Range Status  11/18/2022 4.5 3.5 - 5.2 mmol/L Final  03/09/2013 3.7 3.5 - 5.1 mmol/L Final         Passed - Patient is not pregnant      Passed - Valid encounter within last 6 months    Recent Outpatient Visits           3 weeks ago Acute bacterial sinusitis   [facility] [facility] Mecum, Oswaldo Conroy, PA-C   5 months ago Essential hypertension   [place] [facility] [place], Clydie Braun, NP   8 months ago Acute non-recurrent frontal sinusitis   [place] [facility] Larae Grooms, NP   9 months ago Acute cystitis with hematuria   [place] [facility] Larae Grooms, NP   1 year ago Obesity, Class III, BMI 40-49.9 (morbid obesity) [facility])     [facility] Larae Grooms, NP

## 2023-07-08 ENCOUNTER — Ambulatory Visit: Payer: Self-pay

## 2023-07-08 ENCOUNTER — Ambulatory Visit (INDEPENDENT_AMBULATORY_CARE_PROVIDER_SITE_OTHER): Payer: 59 | Admitting: Nurse Practitioner

## 2023-07-08 ENCOUNTER — Telehealth: Payer: Self-pay | Admitting: Nurse Practitioner

## 2023-07-08 ENCOUNTER — Encounter: Payer: Self-pay | Admitting: Nurse Practitioner

## 2023-07-08 VITALS — BP 117/75 | HR 62 | Temp 98.0°F | Ht 62.5 in | Wt 256.0 lb

## 2023-07-08 DIAGNOSIS — N3001 Acute cystitis with hematuria: Secondary | ICD-10-CM

## 2023-07-08 DIAGNOSIS — R300 Dysuria: Secondary | ICD-10-CM | POA: Diagnosis not present

## 2023-07-08 LAB — URINALYSIS, ROUTINE W REFLEX MICROSCOPIC
Bilirubin, UA: NEGATIVE
Ketones, UA: NEGATIVE
Leukocytes,UA: NEGATIVE
Nitrite, UA: NEGATIVE
Specific Gravity, UA: >1.03 — ABNORMAL HIGH (ref 1.005–1.030)
Urobilinogen, Ur: 0.2 mg/dL (ref 0.2–1.0)
pH, UA: 6.5 (ref 5.0–7.5)

## 2023-07-08 LAB — MICROSCOPIC EXAMINATION
Bacteria, UA: NONE SEEN
WBC, UA: NONE SEEN /[HPF] (ref 0–5)

## 2023-07-08 MED ORDER — NITROFURANTOIN MONOHYD MACRO 100 MG PO CAPS: 100.0000 mg | ORAL_CAPSULE | Freq: Two times a day (BID) | ORAL | 0 refills | Status: DC

## 2023-07-08 NOTE — Telephone Encounter (Signed)
  Chief Complaint: urinary burning Symptoms: blood on TP, chills, bladder pressure,frequency Frequency: 4 days Pertinent Negatives: Patient denies flank or back pain, fever,  Disposition: [] ED /[] Urgent Care (no appt availability in office) / [x] Appointment(In office/virtual)/ []  [facility]/ [] Home Care/ [] Refused Recommended Disposition /[] [facility]/ []  Follow-up with PCP Additional Notes: unable to find appt for today. Will put pt appt on wait list for today in case of openings. Did offer UC if she felt like she could not wait until tomorrow. Pt declined Message from [place] E sent at 07/08/2023  8:12 AM EDT  Summary: Possible UTI symptoms, no appt today   Possible UTI symptoms, no appt today. Scheduled tomorrow but wants appt today [phone]         Reason for Disposition  Blood in urine (red, pink, or tea-colored)  Answer Assessment - Initial Assessment Questions 1. SEVERITY: "How bad is the pain?"  (e.g., Scale 1-10; mild, moderate, or severe)   - MILD (1-3): complains slightly about urination hurting   - MODERATE (4-7): interferes with normal activities     - SEVERE (8-10): excruciating, unwilling or unable to urinate because of the pain      4 2. FREQUENCY: "How many times have you had painful urination today?"      N/a 3. PATTERN: "Is pain present every time you urinate or just sometimes?"      Every time 4. ONSET: "When did the painful urination start?"      4 days  5. FEVER: "Do you have a fever?" If Yes, ask: "What is your temperature, how was it measured, and when did it start?"     chills 6. PAST UTI: "Have you had a urine infection before?" If Yes, ask: "When was the last time?" and "What happened that time?"      N/a 7. CAUSE: "What do you think is causing the painful urination?"  (e.g., UTI, scratch, Herpes sore)     uti 8. OTHER SYMPTOMS: "Do you have any other symptoms?" (e.g., blood in urine, flank pain, genital sores, urgency,  vaginal discharge)     Blood in urine, frequency, chills, bladder pressure  9. PREGNANCY: "Is there any chance you are pregnant?" "When was your last menstrual period?"     N/a  Protocols used: Urination Pain - Female-A-AH

## 2023-07-08 NOTE — Telephone Encounter (Signed)
Called and spoke with [facility] pharmacy to check the status of patient's prescription for Ozempic. Pharmacy technician says the prescription requires a prior authorization. Attempted to initiate prior authorization via CoverMyMeds and received an error message of "patient not found with name or Date of Birth." Will reach out to patient's insurance tomorrow.

## 2023-07-08 NOTE — Telephone Encounter (Signed)
I can see her this afternoon at 3pm.  Just let her know that she will be double booked and may have to wait.

## 2023-07-08 NOTE — Progress Notes (Signed)
BP 117/75   Pulse 62   Temp 98 F (36.7 C) (Oral)   Ht 5' 2.5" (1.588 m)   Wt 256 lb (116.1 kg)   SpO2 97%   BMI 46.08 kg/m    Subjective:    Patient ID: Victoria Bowers, female    DOB: [birth year], 52 y.o.   MRN: [id number]  HPI: Victoria Bowers is a 52 y.o. female  Chief Complaint  Patient presents with   Dysuria    Patient says she has a lot pain during in her private area. Patient says it has become so painful that she is afraid to even go to urinate. Patient says she has also noticed some blood on the tissue when she wipes. Patient says the pain in her lower area. Patient says she has tried Ibuprofen. Patient says first symptoms started about three days ago, and the pain and discomfort started yesterday.    URINARY SYMPTOMS Patient states her symptoms started a couple of days ago.  Pain got a lot worse last night.  Dysuria: yes Urinary frequency: yes Urgency: yes Small volume voids: no Symptom severity: no Urinary incontinence: yes Foul odor: no Hematuria: yes Abdominal pain: yes Back pain: no Suprapubic pain/pressure: yes Flank pain: no Fever:  no Vomiting: no Relief with cranberry juice: no Relief with pyridium: no Status: worse Previous urinary tract infection: no Recurrent urinary tract infection: no Sexual activity: No sexually active/monogomous/practicing safe sex History of sexually transmitted disease: no Penile discharge: no Treatments attempted: none   Relevant past medical, surgical, family and social history reviewed and updated as indicated. Interim medical history since our last visit reviewed. Allergies and medications reviewed and updated.  Review of Systems  Constitutional:  Negative for fever.  Gastrointestinal:  Positive for abdominal pain. Negative for vomiting.  Genitourinary:  Positive for dysuria, frequency, hematuria and urgency. Negative for decreased urine volume and flank pain.  Musculoskeletal:  Negative for back pain.     Per HPI unless specifically indicated above     Objective:    BP 117/75   Pulse 62   Temp 98 F (36.7 C) (Oral)   Ht 5' 2.5" (1.588 m)   Wt 256 lb (116.1 kg)   SpO2 97%   BMI 46.08 kg/m   Wt Readings from Last 3 Encounters:  07/08/23 256 lb (116.1 kg)  04/06/23 251 lb 6.4 oz (114 kg)  11/20/22 252 lb (114.3 kg)    Physical Exam Vitals and nursing note reviewed.  Constitutional:      General: She is not in acute distress.    Appearance: Normal appearance. She is normal weight. She is not ill-appearing, toxic-appearing or diaphoretic.  HENT:     Head: Normocephalic.     Right Ear: External ear normal.     Left Ear: External ear normal.     Nose: Nose normal.     Mouth/Throat:     Mouth: Mucous membranes are moist.     Pharynx: Oropharynx is clear.  Eyes:     General:        Right eye: No discharge.        Left eye: No discharge.     Extraocular Movements: Extraocular movements intact.     Conjunctiva/sclera: Conjunctivae normal.     Pupils: Pupils are equal, round, and reactive to light.  Cardiovascular:     Rate and Rhythm: Normal rate and regular rhythm.     Heart sounds: No murmur heard. Pulmonary:     Effort:  Pulmonary effort is normal. No respiratory distress.     Breath sounds: Normal breath sounds. No wheezing or rales.  Abdominal:     General: Abdomen is flat. Bowel sounds are normal. There is no distension.     Palpations: Abdomen is soft.     Tenderness: There is abdominal tenderness. There is no right CVA tenderness, left CVA tenderness or guarding.  Musculoskeletal:     Cervical back: Normal range of motion and neck supple.  Skin:    General: Skin is warm and dry.     Capillary Refill: Capillary refill takes less than 2 seconds.  Neurological:     General: No focal deficit present.     Mental Status: She is alert and oriented to person, place, and time. Mental status is at baseline.  Psychiatric:        Mood and Affect: Mood normal.         Behavior: Behavior normal.        Thought Content: Thought content normal.        Judgment: Judgment normal.     Results for orders placed or performed in visit on 11/20/22  Cytology - PAP  Result Value Ref Range   High risk HPV Negative    Adequacy      Satisfactory for evaluation; transformation zone component PRESENT.   Diagnosis      - Negative for intraepithelial lesion or malignancy (NILM)   Comment Normal Reference Range HPV - Negative       Assessment & Plan:   Problem List Items Addressed This Visit   None Visit Diagnoses     Acute cystitis with hematuria    -  Primary   Will treat with macrobid BID x 5 days.  Increase water intake.and rest.  Urine sent for culture.  Follow up if symptoms not improved.   Relevant Orders   Urine Culture   Dysuria       Relevant Orders   Urinalysis, Routine w reflex microscopic        Follow up plan: Return if symptoms worsen or fail to improve.

## 2023-07-08 NOTE — Telephone Encounter (Signed)
Please call [facility] and find out why patient is not able to pick up Ozempic.

## 2023-07-08 NOTE — Telephone Encounter (Signed)
Message from [place] E sent at 07/08/2023  8:12 AM EDT  Summary: Possible UTI symptoms, no appt today   Possible UTI symptoms, no appt today. Scheduled tomorrow but wants appt today [phone]        Called pt back and LM on VM to call back.

## 2023-07-08 NOTE — Telephone Encounter (Signed)
Initially sent to wrong pool. Resending to [facility].

## 2023-07-08 NOTE — Telephone Encounter (Signed)
Called and put pt on the schedule for 3pm, did inform patient to call if she can't keep this appointment.

## 2023-07-09 ENCOUNTER — Ambulatory Visit: Payer: 59 | Admitting: Family Medicine

## 2023-07-09 ENCOUNTER — Ambulatory Visit: Payer: 59 | Admitting: Nurse Practitioner

## 2023-07-09 NOTE — Progress Notes (Signed)
Results discussed with patient during visit.

## 2023-07-09 NOTE — Telephone Encounter (Signed)
Spoke with [employer] and was unable to find the patient in their system. Also, provided representative with husband information as patient is listed as a dependent under his insurance. Representative informed me that she was able to find the patient's husband information, but the plan she located has not been active since 2016. Patient was made aware of the update and says she will have her husband reach out to follow up with insurance and let our office know.

## 2023-07-10 LAB — URINE CULTURE

## 2023-07-10 NOTE — Progress Notes (Signed)
Hi Ms. Kroenke.  Your urine culture did not grow any bacteria which means there was no UTI.  If your symptoms persist please follow up in office.

## 2023-07-15 ENCOUNTER — Ambulatory Visit: Payer: 59 | Admitting: Obstetrics and Gynecology

## 2023-07-15 DIAGNOSIS — N951 Menopausal and female climacteric states: Secondary | ICD-10-CM

## 2023-07-16 ENCOUNTER — Ambulatory Visit: Payer: 59 | Admitting: Obstetrics and Gynecology

## 2023-07-16 ENCOUNTER — Encounter: Payer: Self-pay | Admitting: Obstetrics and Gynecology

## 2023-07-16 VITALS — BP 117/73 | HR 60 | Ht 62.5 in | Wt 254.4 lb

## 2023-07-16 DIAGNOSIS — N951 Menopausal and female climacteric states: Secondary | ICD-10-CM

## 2023-07-16 NOTE — Progress Notes (Signed)
Patient presents today due to menopausal symptoms. She states over the last 4-5 months experiencing hot flashes, uti symptoms, trouble sleeping and more.

## 2023-07-16 NOTE — Progress Notes (Signed)
HPI:      [name] is a 52 y.o. G0P0000 who LMP was No LMP recorded. (Menstrual status: IUD).  Subjective:   She presents today with complaint of menopausal symptoms including hot flashes night sweats difficulty sleeping weight gain vaginal dryness.  She has heard about and read about hormone replacement therapy and she is interested in this. She has a Mirena IUD in place.  It is 54 years old.    Hx: The following portions of the patient's history were reviewed and updated as appropriate:             She  has a past medical history of Allergy, Bertolotti's syndrome, Complication of anesthesia, Fatty liver, Fibroid, GERD (gastroesophageal reflux disease), Hypertension, Ovarian cyst, and PONV (postoperative nausea and vomiting). She does not have any pertinent problems on file. She  has a past surgical history that includes Cholecystectomy; Spine surgery (ruptured disc); Nasal sinus surgery (X2); Tonsillectomy and adenoidectomy (1985); Hysteroscopy with D & C (N/A, 03/28/2019); Intrauterine device (iud) insertion (N/A, 03/28/2019); Cervical polypectomy; Tonsillectomy; Colonoscopy with propofol (N/A, 11/26/2020); and Colonoscopy with propofol (N/A, 12/17/2020). Her family history includes Heart disease in her mother; Hypertension in her brother, brother, father, and paternal grandmother. She  reports that she has never smoked. She has never used smokeless tobacco. She reports that she does not drink alcohol and does not use drugs. She has a current medication list which includes the following prescription(s): amlodipine, atenolol, fluticasone, levonorgestrel, nitrofurantoin (macrocrystal-monohydrate), and olmesartan. She is allergic to elemental sulfur and sulfa antibiotics.       Review of Systems:  Review of Systems  Constitutional: Denied constitutional symptoms, night sweats, recent illness, fatigue, fever, insomnia and weight loss.  Eyes: Denied eye symptoms, eye pain, photophobia,  vision change and visual disturbance.  Ears/Nose/Throat/Neck: Denied ear, nose, throat or neck symptoms, hearing loss, nasal discharge, sinus congestion and sore throat.  Cardiovascular: Denied cardiovascular symptoms, arrhythmia, chest pain/pressure, edema, exercise intolerance, orthopnea and palpitations.  Respiratory: Denied pulmonary symptoms, asthma, pleuritic pain, productive sputum, cough, dyspnea and wheezing.  Gastrointestinal: Denied, gastro-esophageal reflux, melena, nausea and vomiting.  Genitourinary: Denied genitourinary symptoms including symptomatic vaginal discharge, pelvic relaxation issues, and urinary complaints.  Musculoskeletal: Denied musculoskeletal symptoms, stiffness, swelling, muscle weakness and myalgia.  Dermatologic: Denied dermatology symptoms, rash and scar.  Neurologic: Denied neurology symptoms, dizziness, headache, neck pain and syncope.  Psychiatric: Denied psychiatric symptoms, anxiety and depression.  Endocrine: See HPI for additional information.   Meds:   Current Outpatient Medications on File Prior to Visit  Medication Sig Dispense Refill   amLODipine (NORVASC) 5 MG tablet TAKE 1 TABLET (5 MG TOTAL) BY MOUTH DAILY. 90 tablet 0   atenolol (TENORMIN) 25 MG tablet TAKE 1 TABLET (25 MG TOTAL) BY MOUTH DAILY. 90 tablet 0   fluticasone (FLONASE) 50 MCG/ACT nasal spray Place 2 sprays into both nostrils every morning.     levonorgestrel (MIRENA) 20 MCG/24HR IUD 1 each by Intrauterine route once.     nitrofurantoin, macrocrystal-monohydrate, (MACROBID) 100 MG capsule Take 1 capsule (100 mg total) by mouth 2 (two) times daily. 10 capsule 0   olmesartan (BENICAR) 40 MG tablet TAKE 1 TABLET BY MOUTH EVERY DAY 90 tablet 0   No current facility-administered medications on file prior to visit.      Objective:     Vitals:   07/16/23 1405  BP: 117/73  Pulse: 60   Filed Weights   07/16/23 1405  Weight: 254 lb 6.4 oz (115.4  kg)                         Assessment:    G0P0000 Patient Active Problem List   Diagnosis Date Noted   Elevated hemoglobin A1c 11/18/2022   Hypercholesterolemia 02/11/2021   Leukocytosis    Obesity, Class III, BMI 40-49.9 (morbid obesity) (HCC)    Acute diverticulitis 09/09/2020   Acute lower UTI 09/09/2020   Disorder of skin of trunk 03/18/2018   Displacement of lumbar intervertebral disc without myelopathy 03/18/2018   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   Low back pain 03/18/2018   Vitamin D deficiency 05/15/2017   DUB (dysfunctional uterine bleeding) 03/21/2016   Left ovarian cyst 03/19/2016   Abnormal bleeding in menstrual cycle 03/10/2016   Left elbow tendonitis 11/28/2015   Fatty liver 07/16/2015   Migraine 07/16/2015   Essential hypertension 03/09/2015     1. Menopausal symptoms     Patient very likely menopausal.   Plan:            1.  FSH ordered  2.  Video visit to further discuss HRT/ERT if [facility] confirms menopause. Orders Orders Placed This Encounter  Procedures   FSH    No orders of the defined types were placed in this encounter.     F/U  Return in about 2 weeks (around 07/30/2023).  Elonda Husky, M.D. 07/16/2023 2:18 PM

## 2023-07-17 LAB — FOLLICLE STIMULATING HORMONE: FSH: 17.2 m[IU]/mL

## 2023-07-29 ENCOUNTER — Other Ambulatory Visit: Payer: Self-pay | Admitting: Physician Assistant

## 2023-07-29 DIAGNOSIS — I10 Essential (primary) hypertension: Secondary | ICD-10-CM

## 2023-07-29 NOTE — Telephone Encounter (Signed)
Requested Prescriptions  Pending Prescriptions Disp Refills   olmesartan (BENICAR) 40 MG tablet [Pharmacy Med Name: OLMESARTAN MEDOXOMIL 40 MG TAB] 90 tablet 0    Sig: TAKE 1 TABLET BY MOUTH EVERY DAY     Cardiovascular:  Angiotensin Receptor Blockers Failed - 07/29/2023  1:28 AM      Failed - Cr in normal range and within 180 days    Creatinine  Date Value Ref Range Status  03/09/2013 0.92 0.60 - 1.30 mg/dL Final   Creatinine, Ser  Date Value Ref Range Status  11/18/2022 0.81 0.57 - 1.00 mg/dL Final         Failed - K in normal range and within 180 days    Potassium  Date Value Ref Range Status  11/18/2022 4.5 3.5 - 5.2 mmol/L Final  03/09/2013 3.7 3.5 - 5.1 mmol/L Final         Passed - Patient is not pregnant      Passed - Last BP in normal range    BP Readings from Last 1 Encounters:  07/16/23 117/73         Passed - Valid encounter within last 6 months    Recent Outpatient Visits           3 weeks ago Acute cystitis with hematuria   [place] [facility] Larae Grooms, NP   3 months ago Acute bacterial sinusitis   [place] [facility] Mecum, Oswaldo Conroy, PA-C   8 months ago Essential hypertension   [place] [facility] Larae Grooms, NP   11 months ago Acute non-recurrent frontal sinusitis   [place] [facility] Larae Grooms, NP   1 year ago Acute cystitis with hematuria   [place] [facility] Larae Grooms, NP               amLODipine (NORVASC) 5 MG tablet [Pharmacy Med Name: AMLODIPINE BESYLATE 5 MG TAB] 90 tablet 0    Sig: TAKE 1 TABLET (5 MG TOTAL) BY MOUTH DAILY.     Cardiovascular: Calcium Channel Blockers 2 Passed - 07/29/2023  1:28 AM      Passed - Last BP in normal range    BP Readings from Last 1 Encounters:  07/16/23 117/73         Passed - Last Heart Rate in normal range    Pulse Readings from Last 1 Encounters:  07/16/23 60         Passed -  Valid encounter within last 6 months    Recent Outpatient Visits           3 weeks ago Acute cystitis with hematuria   [place] [facility] Larae Grooms, NP   3 months ago Acute bacterial sinusitis   [place] [facility] Mecum, Oswaldo Conroy, PA-C   8 months ago Essential hypertension   [place] [facility] Larae Grooms, NP   11 months ago Acute non-recurrent frontal sinusitis   [place] [facility] Larae Grooms, NP   1 year ago Acute cystitis with hematuria   [place] [facility] Larae Grooms, NP               atenolol (TENORMIN) 25 MG tablet [Pharmacy Med Name: ATENOLOL 25 MG TABLET] 90 tablet 0    Sig: TAKE 1 TABLET (25 MG TOTAL) BY MOUTH DAILY.     Cardiovascular: Beta Blockers 2 Passed - 07/29/2023  1:28 AM  Passed - Cr in normal range and within 360 days    Creatinine  Date Value Ref Range Status  03/09/2013 0.92 0.60 - 1.30 mg/dL Final   Creatinine, Ser  Date Value Ref Range Status  11/18/2022 0.81 0.57 - 1.00 mg/dL Final         Passed - Last BP in normal range    BP Readings from Last 1 Encounters:  07/16/23 117/73         Passed - Last Heart Rate in normal range    Pulse Readings from Last 1 Encounters:  07/16/23 60         Passed - Valid encounter within last 6 months    Recent Outpatient Visits           3 weeks ago Acute cystitis with hematuria   [place] [facility] Larae Grooms, NP   3 months ago Acute bacterial sinusitis   [place] [facility] Mecum, Oswaldo Conroy, PA-C   8 months ago Essential hypertension   [place] [facility] Larae Grooms, NP   11 months ago Acute non-recurrent frontal sinusitis   [place] [facility] Larae Grooms, NP   1 year ago Acute cystitis with hematuria   [place] [facility] Larae Grooms, NP

## 2023-07-30 ENCOUNTER — Ambulatory Visit: Payer: 59 | Admitting: Obstetrics and Gynecology

## 2023-07-30 ENCOUNTER — Encounter: Payer: Self-pay | Admitting: Obstetrics and Gynecology

## 2023-07-30 VITALS — BP 112/69 | HR 61 | Ht 62.5 in | Wt 253.4 lb

## 2023-07-30 DIAGNOSIS — K4090 Unilateral inguinal hernia, without obstruction or gangrene, not specified as recurrent: Secondary | ICD-10-CM | POA: Diagnosis not present

## 2023-07-30 DIAGNOSIS — N951 Menopausal and female climacteric states: Secondary | ICD-10-CM

## 2023-07-30 MED ORDER — ESTRADIOL 1 MG PO TABS: 1.0000 mg | ORAL_TABLET | Freq: Every day | ORAL | 1 refills | Status: DC

## 2023-07-30 NOTE — Progress Notes (Signed)
HPI:      [name] is a 52 y.o. G0P0000 who LMP was No LMP recorded. (Menstrual status: IUD).  Subjective:   She presents today stating that she has hot flashes night sweats mood changes and generally feels as if something is different.  She has described all the signs and symptoms of menopause including vaginal dryness. In addition she has begun having significant right lower quadrant pain.  She said it is worse with movement and she can put her finger on the area that bothers her.    Hx: The following portions of the patient's history were reviewed and updated as appropriate:             She  has a past medical history of Allergy, Bertolotti's syndrome, Complication of anesthesia, Fatty liver, Fibroid, GERD (gastroesophageal reflux disease), Hypertension, Ovarian cyst, and PONV (postoperative nausea and vomiting). She does not have any pertinent problems on file. She  has a past surgical history that includes Cholecystectomy; Spine surgery (ruptured disc); Nasal sinus surgery (X2); Tonsillectomy and adenoidectomy (1985); Hysteroscopy with D & C (N/A, 03/28/2019); Intrauterine device (iud) insertion (N/A, 03/28/2019); Cervical polypectomy; Tonsillectomy; Colonoscopy with propofol (N/A, 11/26/2020); and Colonoscopy with propofol (N/A, 12/17/2020). Her family history includes Heart disease in her mother; Hypertension in her brother, brother, father, and paternal grandmother. She  reports that she has never smoked. She has never used smokeless tobacco. She reports that she does not drink alcohol and does not use drugs. She has a current medication list which includes the following prescription(s): amlodipine, atenolol, estradiol, fluticasone, levonorgestrel, and olmesartan. She is allergic to elemental sulfur and sulfa antibiotics.       Review of Systems:  Review of Systems  Constitutional: Denied constitutional symptoms, night sweats, recent illness, fatigue, fever, insomnia and weight  loss.  Eyes: Denied eye symptoms, eye pain, photophobia, vision change and visual disturbance.  Ears/Nose/Throat/Neck: Denied ear, nose, throat or neck symptoms, hearing loss, nasal discharge, sinus congestion and sore throat.  Cardiovascular: Denied cardiovascular symptoms, arrhythmia, chest pain/pressure, edema, exercise intolerance, orthopnea and palpitations.  Respiratory: Denied pulmonary symptoms, asthma, pleuritic pain, productive sputum, cough, dyspnea and wheezing.  Gastrointestinal: Denied, gastro-esophageal reflux, melena, nausea and vomiting.  Genitourinary: Denied genitourinary symptoms including symptomatic vaginal discharge, pelvic relaxation issues, and urinary complaints.  Musculoskeletal: Denied musculoskeletal symptoms, stiffness, swelling, muscle weakness and myalgia.  Dermatologic: Denied dermatology symptoms, rash and scar.  Neurologic: Denied neurology symptoms, dizziness, headache, neck pain and syncope.  Psychiatric: Denied psychiatric symptoms, anxiety and depression.  Endocrine: See HPI for additional information.   Meds:   Current Outpatient Medications on File Prior to Visit  Medication Sig Dispense Refill   amLODipine (NORVASC) 5 MG tablet TAKE 1 TABLET (5 MG TOTAL) BY MOUTH DAILY. 90 tablet 0   atenolol (TENORMIN) 25 MG tablet TAKE 1 TABLET (25 MG TOTAL) BY MOUTH DAILY. 90 tablet 0   fluticasone (FLONASE) 50 MCG/ACT nasal spray Place 2 sprays into both nostrils every morning.     levonorgestrel (MIRENA) 20 MCG/24HR IUD 1 each by Intrauterine route once.     olmesartan (BENICAR) 40 MG tablet TAKE 1 TABLET BY MOUTH EVERY DAY 90 tablet 0   No current facility-administered medications on file prior to visit.      Objective:     Vitals:   07/30/23 1051  BP: (!) 140/84  Pulse: 61   Filed Weights   07/30/23 1051  Weight: 253 lb 6.4 oz (114.9 kg)  Examination of the abdomen reveals pain in the right lower quadrant.  Pain is localized to the  abdominal wall.  Light palpation causes significant discomfort.          Assessment:    G0P0000 Patient Active Problem List   Diagnosis Date Noted   Elevated hemoglobin A1c 11/18/2022   Hypercholesterolemia 02/11/2021   Leukocytosis    Obesity, Class III, BMI 40-49.9 (morbid obesity) (HCC)    Acute diverticulitis 09/09/2020   Acute lower UTI 09/09/2020   Disorder of skin of trunk 03/18/2018   Displacement of lumbar intervertebral disc without myelopathy 03/18/2018   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   Low back pain 03/18/2018   Vitamin D deficiency 05/15/2017   DUB (dysfunctional uterine bleeding) 03/21/2016   Left ovarian cyst 03/19/2016   Abnormal bleeding in menstrual cycle 03/10/2016   Left elbow tendonitis 11/28/2015   Fatty liver 07/16/2015   Migraine 07/16/2015   Essential hypertension 03/09/2015     1. Menopausal symptoms   2. Unilateral inguinal hernia without obstruction or gangrene, recurrence not specified     Likely right inguinal hernia.  Patient has significant menopausal symptoms despite the most recent negative FSH for menopause.   Plan:            1.  Referral to general surgery  2.  HRT I have discussed HRT with the patient in detail.  The risk/benefits of it were reviewed.  She understands that during menopause Estrogen decreases dramatically and that this results in an increased risk of cardiovascular disease as well as osteoporosis.  We have also discussed the fact that hot flashes often result from a decrease in Estrogen, and that by replacing Estrogen, they can often be alleviated.  We have discussed skin, vaginal and urinary tract changes that may also take place from this drop in Estrogen.  Emotional changes have also been linked to Estrogen and we have briefly discussed this.  The benefits of HRT including decrease in hot flashes, vaginal dryness, and osteoporosis were discussed.  The emotional benefit and a possible change in her  cardiovascular risk profile was also reviewed.  The risks associated with Hormone Replacement Therapy were also reviewed.  The use of unopposed Estrogen and its relationship to endometrial cancer was discussed.  The addition of Progesterone and its beneficial effect on endometrial cancer was also noted.  The fact that there has been no consistent definitive studies showing an increase in breast cancer in women who use HRT was discussed with the patient.  The possible side effects including breast tenderness, fluid retention, mood changes and vaginal bleeding were discussed.  The patient was informed that this is an elective medication and that she may choose not to take Hormone Replacement Therapy.  Literature on HRT was made available, and I believe that after answering all of the patient's questions.  Special emphasis on the WHI study, as well as several studies since that pertaining to the risks and benefits of estrogen replacement therapy were compared.  The possible limitations of these studies were discussed including the age stratification of the WHI study.  The possible increased role of Progesterone in these studies was discussed in detail.  Different types of hormone formulation and methods of taking hormone replacement therapy were discussed. Literature on HRT was made available, and I believe that after answering all of the patient's questions she has an adequate and informed understanding of the risks and benefits of HRT. We will begin ERT with Estrace. (Mirena IUD  in place) Orders Orders Placed This Encounter  Procedures   Ambulatory referral to General Surgery     Meds ordered this encounter  Medications   estradiol (ESTRACE) 1 MG tablet    Sig: Take 1 tablet (1 mg total) by mouth daily.    Dispense:  90 tablet    Refill:  1      F/U  No follow-ups on file.  Elonda Husky, M.D. 07/30/2023 11:04 AM

## 2023-08-10 ENCOUNTER — Other Ambulatory Visit
Admission: RE | Admit: 2023-08-10 | Discharge: 2023-08-10 | Disposition: A | Discharge status: Home / Self Care | Payer: 59 | Source: Ambulatory Visit | Attending: Surgery | Admitting: Surgery

## 2023-08-10 ENCOUNTER — Encounter: Payer: Self-pay | Admitting: Surgery

## 2023-08-10 ENCOUNTER — Ambulatory Visit
Admission: RE | Admit: 2023-08-10 | Discharge: 2023-08-10 | Disposition: A | Discharge status: Home / Self Care | Payer: 59 | Source: Ambulatory Visit | Attending: Surgery | Admitting: Surgery

## 2023-08-10 ENCOUNTER — Ambulatory Visit (INDEPENDENT_AMBULATORY_CARE_PROVIDER_SITE_OTHER): Payer: 59 | Admitting: Surgery

## 2023-08-10 VITALS — BP 143/81 | HR 64 | Temp 98.4°F | Ht 64.0 in | Wt 251.2 lb

## 2023-08-10 DIAGNOSIS — R1031 Right lower quadrant pain: Secondary | ICD-10-CM

## 2023-08-10 DIAGNOSIS — K5792 Diverticulitis of intestine, part unspecified, without perforation or abscess without bleeding: Secondary | ICD-10-CM

## 2023-08-10 LAB — COMPREHENSIVE METABOLIC PANEL
ALT: 22 U/L (ref 0–44)
AST: 22 U/L (ref 15–41)
Albumin: 3.7 g/dL (ref 3.5–5.0)
Alkaline Phosphatase: 78 U/L (ref 38–126)
Anion gap: 8 (ref 5–15)
BUN: 14 mg/dL (ref 6–20)
CO2: 24 mmol/L (ref 22–32)
Calcium: 8.7 mg/dL — ABNORMAL LOW (ref 8.9–10.3)
Chloride: 102 mmol/L (ref 98–111)
Creatinine, Ser: 0.92 mg/dL (ref 0.44–1.00)
GFR, Estimated: >60 mL/min (ref >60)
Glucose, Bld: 131 mg/dL — ABNORMAL HIGH (ref 70–99)
Potassium: 4 mmol/L (ref 3.5–5.1)
Sodium: 134 mmol/L — ABNORMAL LOW (ref 135–145)
Total Bilirubin: <0.2 mg/dL (ref <1.2)
Total Protein: 7.2 g/dL (ref 6.5–8.1)

## 2023-08-10 LAB — CBC
HCT: 37.9 % (ref 36.0–46.0)
Hemoglobin: 12.6 g/dL (ref 12.0–15.0)
MCH: 29.9 pg (ref 26.0–34.0)
MCHC: 33.2 g/dL (ref 30.0–36.0)
MCV: 89.8 fL (ref 80.0–100.0)
Platelets: 355 10*3/uL (ref 150–400)
RBC: 4.22 MIL/uL (ref 3.87–5.11)
RDW: 12.7 % (ref 11.5–15.5)
WBC: 10.7 10*3/uL — ABNORMAL HIGH (ref 4.0–10.5)
nRBC: 0 % (ref 0.0–0.2)

## 2023-08-10 MED ORDER — IOHEXOL 300 MG/ML  SOLN
100.0000 mL | Freq: Once | INTRAMUSCULAR | Status: AC | PRN
  Administered 2023-08-10: 100 mL via INTRAVENOUS

## 2023-08-10 MED ORDER — CIPROFLOXACIN HCL 500 MG PO TABS: 500.0000 mg | ORAL_TABLET | Freq: Two times a day (BID) | ORAL | 0 refills | Status: AC

## 2023-08-10 MED ORDER — METRONIDAZOLE 500 MG PO TABS: 500.0000 mg | ORAL_TABLET | Freq: Three times a day (TID) | ORAL | 0 refills | Status: DC

## 2023-08-10 MED ORDER — IOHEXOL 9 MG/ML PO SOLN
500.0000 mL | ORAL | Status: AC
  Administered 2023-08-10: 500 mL via ORAL

## 2023-08-10 NOTE — Progress Notes (Signed)
Patient ID: Victoria Bowers, female   DOB: 1970-12-08, 52 y.o.   MRN: [id number]  HPI Victoria Bowers is a 52 y.o. female consultation at the request of Dr. Logan Bores.  She complains of some intermittent right side abdominal pain that has been present for the last 3 to 4 weeks.  It is intermittent mild to moderate and dull.  Seems to be worsening with certain movements.  He has been treated for UTI and thought that it was a UTI.  Evidence from gynecology did not think this was pelvic in origin. no fevers no chills.  She denies any lumps.  She did have a prior history of diverticulitis requiring hospitalization a couple of years ago.  Please note that I have personally reviewed that CT scan showing evidence of diverticulitis without perforation.  Surgical history include cholecystectomy.  Also completed a colonoscopy 2 years ago showing no evidence of suspicious lesions. SHe is able to perform more than 4 METS of activity without shortness of breath or chest pain.  Her BMI is 45 Hemoglobin A1c was 6.5.  She seems to be diabetic per numbers  HPI  Past Medical History:  Diagnosis Date   Allergy    Bertolotti's syndrome    Complication of anesthesia    Fatty liver    Fibroid    GERD (gastroesophageal reflux disease)    OCC   Hypertension    Ovarian cyst    PONV (postoperative nausea and vomiting)    NAUSEATED    Past Surgical History:  Procedure Laterality Date   CERVICAL POLYPECTOMY     CHOLECYSTECTOMY     COLONOSCOPY WITH PROPOFOL N/A 11/26/2020   Procedure: COLONOSCOPY WITH PROPOFOL;  Surgeon: Wyline Mood, MD;  Location: [facility] ENDOSCOPY;  Service: Gastroenterology;  Laterality: N/A;   COLONOSCOPY WITH PROPOFOL N/A 12/17/2020   Procedure: COLONOSCOPY WITH PROPOFOL;  Surgeon: Wyline Mood, MD;  Location: [facility] ENDOSCOPY;  Service: Gastroenterology;  Laterality: N/A;   HYSTEROSCOPY WITH D & C N/A 03/28/2019   Procedure: DILATATION AND CURETTAGE /HYSTEROSCOPY;  Surgeon: Linzie Collin, MD;   Location: [facility] ORS;  Service: Gynecology;  Laterality: N/A;   INTRAUTERINE DEVICE (IUD) INSERTION N/A 03/28/2019   Procedure: INTRAUTERINE DEVICE (IUD) INSERTION;  Surgeon: Linzie Collin, MD;  Location: [facility] ORS;  Service: Gynecology;  Laterality: N/A;   NASAL SINUS SURGERY  X2   SPINE SURGERY  ruptured disc   LUMBAR   TONSILLECTOMY     TONSILLECTOMY AND ADENOIDECTOMY  1985    Family History  Problem Relation Age of Onset   Hypertension Father    Hypertension Brother    Hypertension Paternal Grandmother    Hypertension Brother    Heart disease Mother     Social History Social History   Tobacco Use   Smoking status: Never   Smokeless tobacco: Never  Vaping Use   Vaping status: Never Used  Substance Use Topics   Alcohol use: No    Alcohol/week: 0.0 standard drinks of alcohol   Drug use: No    Allergies  Allergen Reactions   Elemental Sulfur Hives   Sulfa Antibiotics Hives    Current Outpatient Medications  Medication Sig Dispense Refill   amLODipine (NORVASC) 5 MG tablet TAKE 1 TABLET (5 MG TOTAL) BY MOUTH DAILY. 90 tablet 0   atenolol (TENORMIN) 25 MG tablet TAKE 1 TABLET (25 MG TOTAL) BY MOUTH DAILY. 90 tablet 0   ciprofloxacin (CIPRO) 500 MG tablet Take 1 tablet (500 mg total) by mouth 2 (two)  times daily for 10 days. 20 tablet 0   estradiol (ESTRACE) 1 MG tablet Take 1 tablet (1 mg total) by mouth daily. 90 tablet 1   fluticasone (FLONASE) 50 MCG/ACT nasal spray Place 2 sprays into both nostrils every morning.     levonorgestrel (MIRENA) 20 MCG/24HR IUD 1 each by Intrauterine route once.     metroNIDAZOLE (FLAGYL) 500 MG tablet Take 1 tablet (500 mg total) by mouth 3 (three) times daily for 10 days. 30 tablet 0   olmesartan (BENICAR) 40 MG tablet TAKE 1 TABLET BY MOUTH EVERY DAY 90 tablet 0   No current facility-administered medications for this visit.     Review of Systems Full ROS  was asked and was negative except for the information on the  HPI  Physical Exam Blood pressure (!) 143/81, pulse 64, temperature 98.4 F (36.9 C), temperature source Oral, height 5\' 4"  (1.626 m), weight 251 lb 3.2 oz (113.9 kg), SpO2 96%. CONSTITUTIONAL: NAD. EYES: Pupils are equal, round, Sclera are non-icteric. EARS, NOSE, MOUTH AND THROAT: TThe oral mucosa is pink and moist. Hearing is intact to voice. LYMPH NODES:  Lymph nodes in the neck are normal. RESPIRATORY:  Lungs are clear. There is normal respiratory effort, with equal breath sounds bilaterally, and without pathologic use of accessory muscles. CARDIOVASCULAR: Heart is regular without murmurs, gallops, or rubs. GI: The abdomen is  soft,  nondistended. Tender on LLQ and RLQ, some guarding but no overt peritonitis, I am unable to palpate a definitive inguinal or ventral defect although exam is limited due to body habitus. There are no palpable masses. There is no hepatosplenomegaly. There are normal bowel sounds GU: Rectal deferred.   MUSCULOSKELETAL: Normal muscle strength and tone. No cyanosis or edema.   SKIN: Turgor is good and there are no pathologic skin lesions or ulcers. NEUROLOGIC: Motor and sensation is grossly normal. Cranial nerves are grossly intact. PSYCH:  Oriented to person, place and time. Affect is normal.  Data Reviewed  I have personally reviewed the patient's imaging, laboratory findings and medical records.    Assessment/Plan 52 year old female with lower abdominal pain initially on the right lower quadrant and now bilateral lower abdomen.  This is more consistent with diverticulitis.  I cannot feel a definitive inguinal or ventral defect. My suspicious of diverticulitis I will empirically start on ciprofloxacin and Flagyl.  We will order stat CT and reevaluate.  Eyster to change diet to clear liquids.  At this time she does not seem toxic and is not peritonitic and does not need emergent surgical intervention.  We will do close f/u. I spent 55 minutes in this encounter  including personally reviewing imaging studies, coordinating her care, reviewing medical records, counseling the patient and performing documentation.  Copy of this report was sent to the referring provider   Sterling Big, MD FACS General Surgeon 08/10/2023, 12:56 PM

## 2023-08-10 NOTE — Patient Instructions (Addendum)
Your CT is scheduled for 08/10/2023 3 pm (arrive by 12:45 pm) at [facility] on [address].   Go to [facility] to get labs drawn. You do not need an appointment.   Inguinal Hernia, Adult An inguinal hernia is when fat or your intestines push through a weak spot in a muscle where your leg meets your lower belly (groin). This causes a bulge. This kind of hernia could also be: In your scrotum, if you are female. In folds of skin around your vagina, if you are female. There are three types of inguinal hernias: Hernias that can be pushed back into the belly (are reducible). This type rarely causes pain. Hernias that cannot be pushed back into the belly (are incarcerated). Hernias that cannot be pushed back into the belly and lose their blood supply (are strangulated). This type needs emergency surgery. What are the causes? This condition is caused by having a weak spot in the muscles or tissues in your groin. This develops over time. The hernia may poke through the weak spot when you strain your lower belly muscles all of a sudden, such as when you: Lift a heavy object. Strain to poop (have a bowel movement). Trouble pooping (constipation) can lead to straining. Cough. What increases the risk? This condition is more likely to develop in: Males. Pregnant females. People who: Are overweight. Work in jobs that require long periods of standing or heavy lifting. Have had an inguinal hernia before. Smoke or have lung disease. These factors can lead to long-term (chronic) coughing. What are the signs or symptoms? Symptoms may depend on the size of the hernia. Often, a small hernia has no symptoms. Symptoms of a larger hernia may include: A bulge in the groin area. This is easier to see when standing. You might not be able to see it when you are lying down. Pain or burning in the groin. This may get worse when you lift, strain, or cough. A dull ache or a feeling of pressure in the  groin. An abnormal bulge in the scrotum, in males. Symptoms of a strangulated inguinal hernia may include: A bulge in your groin that is very painful and tender to the touch. A bulge that turns red or purple. Fever, feeling like you may vomit (nausea), and vomiting. Not being able to poop or to pass gas. How is this treated? Treatment depends on the size of your hernia and whether you have symptoms. If you do not have symptoms, your doctor may have you watch your hernia carefully and have you come in for follow-up visits. If your hernia is large or if you have symptoms, you may need surgery to repair the hernia. Follow these instructions at home: Lifestyle Avoid lifting heavy objects. Avoid standing for long amounts of time. Do not smoke or use any products that contain nicotine or tobacco. If you need help quitting, ask your doctor. Stay at a healthy weight. Prevent trouble pooping You may need to take these actions to prevent or treat trouble pooping: Drink enough fluid to keep your pee (urine) pale yellow. Take over-the-counter or prescription medicines. Eat foods that are high in fiber. These include beans, whole grains, and fresh fruits and vegetables. Limit foods that are high in fat and sugar. These include fried or sweet foods. General instructions You may try to push your hernia back in place by very gently pressing on it when you are lying down. Do not try to push the bulge back in if  it will not go in easily. Watch your hernia for any changes in shape, size, or color. Tell your doctor if you see any changes. Take over-the-counter and prescription medicines only as told by your doctor. Keep all follow-up visits. Contact a doctor if: You have a fever or chills. You have new symptoms. Your symptoms get worse. Get help right away if: You have pain in your groin that gets worse all of a sudden. You have a bulge in your groin that: Gets bigger all of a sudden, and it does not  get smaller after that. Turns red or purple. Is painful when you touch it. You are a female, and you have: Sudden pain in your scrotum. A sudden change in the size of your scrotum. You cannot push the hernia back in place by very gently pressing on it when you are lying down. You feel like you may vomit, and that feeling does not go away. You keep vomiting. You have a fast heartbeat. You cannot poop or pass gas. These symptoms may be an emergency. Get help right away. Call your local emergency services (911 in the [place] Do not wait to see if the symptoms will go away. Do not drive yourself to the hospital. Summary An inguinal hernia is when fat or your intestines push through a weak spot in a muscle where your leg meets your lower belly (groin). This causes a bulge. If you do not have symptoms, you may not need treatment. If you have symptoms or a large hernia, you may need surgery. Avoid lifting heavy objects. Also, avoid standing for long amounts of time. Do not try to push the bulge back in if it will not go in easily. This information is not intended to replace advice given to you by your health care provider. Make sure you discuss any questions you have with your health care provider. Document Revised: 05/15/2020 Document Reviewed: 05/15/2020 Elsevier Patient Education  2024 [employer].

## 2023-08-19 ENCOUNTER — Encounter: Payer: Self-pay | Admitting: Surgery

## 2023-08-19 ENCOUNTER — Ambulatory Visit (INDEPENDENT_AMBULATORY_CARE_PROVIDER_SITE_OTHER): Payer: 59 | Admitting: Surgery

## 2023-08-19 VITALS — BP 114/65 | HR 60 | Temp 98.0°F | Ht 64.0 in | Wt 249.0 lb

## 2023-08-19 DIAGNOSIS — R1031 Right lower quadrant pain: Secondary | ICD-10-CM | POA: Diagnosis not present

## 2023-08-19 NOTE — Patient Instructions (Signed)
Follow-up with our office as needed.  Please call and ask to speak with a nurse if you develop questions or concerns.    Hernia, Adult     A hernia happens when an organ or tissue inside your body pushes out through a weak spot in the muscles of your belly (abdomen). This makes a bulge. The bulge may be: In a scar from a surgery that was done in your belly (incisional hernia). Near your belly button (umbilical hernia). In your groin (inguinal hernia). Your groin is the area where your leg meets your lower belly. If you are a female, this type could also be in your scrotum. In your upper thigh (femoral hernia). Inside your belly (hiatal hernia). This happens when your stomach slides above the muscle between your belly and your chest (diaphragm). What are the causes? This condition may be caused by: Lifting heavy things. Coughing over a long period of time. Having trouble pooping (constipation). Trouble pooping can lead to straining. A cut from surgery in your belly. A physical problem that is present at birth. Being very overweight. Smoking. Too much fluid in your belly. A testicle that has not moved down into the scrotum, in males. What are the signs or symptoms? The main symptom is a bulge in the area of the hernia, but a bulge may not always be seen. It may grow bigger or be easier to see when you cough or strain (such as when lifting something heavy). A hernia that can be pushed back into the belly rarely causes pain. A hernia that cannot be pushed back into the belly may lose its blood supply. This may cause: Pain. Fever. A feeling like you may vomit, and vomiting. Swelling. Trouble pooping. How is this treated? A hernia that is small and painless may not need to be treated. A hernia that is large or painful may be treated with surgery. Surgery to treat a hernia involves pushing the bulge back into place and repairing the weak area of the muscle or belly. Follow these  instructions at home: Activity Avoid straining the muscles near your hernia. This can happen when you: Lift something heavy. Poop (have a bowel movement). Do not lift anything that is heavier than 10 lb (4.5 kg), or the limit that you are told. When you lift something heavy, use your leg muscles. Do not use your back muscles to lift. Prevent trouble pooping If told by your doctor, take steps to prevent trouble pooping. You may need to: Drink enough fluid to keep your pee (urine) pale yellow. Take medicines. You will be told what medicines to take. Eat foods that are high in fiber. These include beans, whole grains, and fresh fruits and vegetables. Limit foods that are high in fat and sugar. These include fried or sweet foods. General instructions When you cough, try to cough gently. You may try to push your hernia back in by gently pressing on it when you are lying down. Do not try to force the bulge back in if it will not go in easily. If you are overweight, work with your doctor to lose weight safely. Do not smoke or use any products that contain nicotine or tobacco. If you need help quitting, ask your doctor. If you will be having surgery, watch your hernia for changes in shape, size, or color. Tell your doctor if you see any changes. Take over-the-counter and prescription medicines only as told by your doctor. Keep all follow-up visits. Contact a doctor if:  You get new pain, swelling, or redness near your hernia. You poop fewer times in a week than normal. You have trouble pooping. You have poop that is more dry than normal. You have poop that is harder or larger than normal. Get help right away if: You have a fever or chills. You have belly pain that gets worse. You feel like you may vomit, or you vomit. Your hernia cannot be pushed in by gently pressing on it when you are lying down. Your hernia: Changes in shape or size. Changes color. Feels hard, or it hurts when you touch  it. These symptoms may be an emergency. Get help right away. Call your local emergency services (911 in the [place] Do not wait to see if the symptoms will go away. Do not drive yourself to the hospital. Summary A hernia happens when an organ or tissue inside your body pushes out through a weak spot in the belly muscles. This creates a bulge. If your hernia is small and it does not hurt, you may not need treatment. If your hernia is large or it hurts, you may need surgery. If you will be having surgery, watch your hernia for changes in shape, size, or color. Tell your doctor about any changes. This information is not intended to replace advice given to you by your health care provider. Make sure you discuss any questions you have with your health care provider. Document Revised: 04/23/2020 Document Reviewed: 04/23/2020 Elsevier Patient Education  2024 [employer].

## 2023-08-19 NOTE — Progress Notes (Signed)
Outpatient Surgical Follow Up  08/19/2023  Victoria Bowers is an 52 y.o. female.   Chief Complaint  Patient presents with   Follow-up    HPI: Channing Mutters is following up for inguinal pain.  Pain is much better.  She did have a CT scan that I personally reviewed showing evidence of diverticulosis without diverticulitis no evidence of any alarming or worrisome intra-abdominal pathology.  She does have an umbilical hernia that is currently asymptomatic.  She reports that she feels much better.  Pain is almost gone.  She is accompanied by her husband.  Please note that I have personally reviewed the CT scan and also showed the CT scan to the patient.  Past Medical History:  Diagnosis Date   Allergy    Bertolotti's syndrome    Complication of anesthesia    Fatty liver    Fibroid    GERD (gastroesophageal reflux disease)    OCC   Hypertension    Ovarian cyst    PONV (postoperative nausea and vomiting)    NAUSEATED    Past Surgical History:  Procedure Laterality Date   CERVICAL POLYPECTOMY     CHOLECYSTECTOMY     COLONOSCOPY WITH PROPOFOL N/A 11/26/2020   Procedure: COLONOSCOPY WITH PROPOFOL;  Surgeon: Wyline Mood, MD;  Location: [facility] ENDOSCOPY;  Service: Gastroenterology;  Laterality: N/A;   COLONOSCOPY WITH PROPOFOL N/A 12/17/2020   Procedure: COLONOSCOPY WITH PROPOFOL;  Surgeon: Wyline Mood, MD;  Location: [facility] ENDOSCOPY;  Service: Gastroenterology;  Laterality: N/A;   HYSTEROSCOPY WITH D & C N/A 03/28/2019   Procedure: DILATATION AND CURETTAGE /HYSTEROSCOPY;  Surgeon: Linzie Collin, MD;  Location: [facility] ORS;  Service: Gynecology;  Laterality: N/A;   INTRAUTERINE DEVICE (IUD) INSERTION N/A 03/28/2019   Procedure: INTRAUTERINE DEVICE (IUD) INSERTION;  Surgeon: Linzie Collin, MD;  Location: [facility] ORS;  Service: Gynecology;  Laterality: N/A;   NASAL SINUS SURGERY  X2   SPINE SURGERY  ruptured disc   LUMBAR   TONSILLECTOMY     TONSILLECTOMY AND ADENOIDECTOMY  1985    Family  History  Problem Relation Age of Onset   Hypertension Father    Hypertension Brother    Hypertension Paternal Grandmother    Hypertension Brother    Heart disease Mother     Social History:  reports that she has never smoked. She has never been exposed to tobacco smoke. She has never used smokeless tobacco. She reports that she does not drink alcohol and does not use drugs.  Allergies:  Allergies  Allergen Reactions   Elemental Sulfur Hives   Sulfa Antibiotics Hives    Medications reviewed.    ROS Full ROS performed and is otherwise negative other than what is stated in HPI   BP 114/65   Pulse 60   Temp 98 F (36.7 C)   Ht 5\' 4"  (1.626 m)   Wt 249 lb (112.9 kg)   SpO2 98%   BMI 42.74 kg/m   Physical Exam Vitals and nursing note reviewed. Exam conducted with a chaperone present.  Constitutional:      General: She is not in acute distress.    Appearance: Normal appearance. She is obese. She is not ill-appearing.  Cardiovascular:     Rate and Rhythm: Normal rate and regular rhythm.     Heart sounds:     No gallop.  Pulmonary:     Effort: Pulmonary effort is normal. No respiratory distress.     Breath sounds: Normal breath sounds. No stridor. No rhonchi.  Abdominal:     General: Abdomen is flat. There is no distension.     Palpations: Abdomen is soft. There is no mass.     Tenderness: There is no abdominal tenderness. There is no guarding.     Hernia: No hernia is present.     Comments: Difficult  to palpate umbilical hernia due to body habitus  Musculoskeletal:     Cervical back: Normal range of motion and neck supple. No rigidity or tenderness.  Skin:    General: Skin is warm and dry.     Capillary Refill: Capillary refill takes less than 2 seconds.  Neurological:     General: No focal deficit present.     Mental Status: She is alert and oriented to person, place, and time.  Psychiatric:        Mood and Affect: Mood normal.        Behavior: Behavior  normal.        Thought Content: Thought content normal.        Judgment: Judgment normal.       Assessment/Plan:  Lower abdominal pain likely from episode of diverticulitis that resolved.  At this time the patient feels much better.  CT scan without any alarming signs.  No evidence of inguinal hernias.  She does have an asymptomatic umbilical hernia.  We discussed with her in detail that typically we do not fix those if they are asymptomatic.  She understands that if she were to become symptomatic we will be happy to address this problem. Had a good discussion with her regarding risk factor modification diet exercise. I spent 40 minutes in this encounter including personally reviewing medical record, personally reviewing imaging studies, extensive counseling, placing orders and performing documentation We will see her on a as needed basis   Sterling Big, MD [facility] General Surgeon

## 2023-09-15 ENCOUNTER — Ambulatory Visit: Payer: 59 | Admitting: Obstetrics and Gynecology

## 2023-09-15 ENCOUNTER — Encounter: Payer: Self-pay | Admitting: Obstetrics and Gynecology

## 2023-09-15 VITALS — BP 124/80 | HR 68 | Ht 64.0 in | Wt 251.4 lb

## 2023-09-15 DIAGNOSIS — N951 Menopausal and female climacteric states: Secondary | ICD-10-CM | POA: Diagnosis not present

## 2023-09-15 DIAGNOSIS — Z79890 Hormone replacement therapy (postmenopausal): Secondary | ICD-10-CM | POA: Diagnosis not present

## 2023-09-15 NOTE — Progress Notes (Signed)
Patient presents today to follow-up on HRT. She states taking 1 mg Estradiol daily and feeling much better! Reports all symptoms have resolved.

## 2023-09-15 NOTE — Progress Notes (Signed)
HPI:      Victoria Bowers is a 52 y.o. G0P0000 who LMP was No LMP recorded. (Menstrual status: IUD).  Subjective:   She presents today for follow-up of ERT.  She was having hot flashes night sweats and mood changes.  She now reports that they have all resolved.  She is very excited and happy about taking estrogen.  She has a Mirena IUD in place for endometrial protection. She did go to see Dr. Elenor Legato and she was found to have an umbilical hernia and some diverticulitis.  They have discussed both of these issues and she is doing better.    Hx: The following portions of the patient's history were reviewed and updated as appropriate:             She  has a past medical history of Allergy, Bertolotti's syndrome, Complication of anesthesia, Fatty liver, Fibroid, GERD (gastroesophageal reflux disease), Hypertension, Ovarian cyst, and PONV (postoperative nausea and vomiting). She does not have any pertinent problems on file. She  has a past surgical history that includes Cholecystectomy; Spine surgery (ruptured disc); Nasal sinus surgery (X2); Tonsillectomy and adenoidectomy (1985); Hysteroscopy with D & C (N/A, 03/28/2019); Intrauterine device (iud) insertion (N/A, 03/28/2019); Cervical polypectomy; Tonsillectomy; Colonoscopy with propofol (N/A, 11/26/2020); and Colonoscopy with propofol (N/A, 12/17/2020). Her family history includes Heart disease in her mother; Hypertension in her brother, brother, father, and paternal grandmother. She  reports that she has never smoked. She has never been exposed to tobacco smoke. She has never used smokeless tobacco. She reports that she does not drink alcohol and does not use drugs. She has a current medication list which includes the following prescription(s): amlodipine, atenolol, estradiol, fluticasone, levonorgestrel, and olmesartan. She is allergic to elemental sulfur and sulfa antibiotics.       Review of Systems:  Review of Systems  Constitutional:  Denied constitutional symptoms, night sweats, recent illness, fatigue, fever, insomnia and weight loss.  Eyes: Denied eye symptoms, eye pain, photophobia, vision change and visual disturbance.  Ears/Nose/Throat/Neck: Denied ear, nose, throat or neck symptoms, hearing loss, nasal discharge, sinus congestion and sore throat.  Cardiovascular: Denied cardiovascular symptoms, arrhythmia, chest pain/pressure, edema, exercise intolerance, orthopnea and palpitations.  Respiratory: Denied pulmonary symptoms, asthma, pleuritic pain, productive sputum, cough, dyspnea and wheezing.  Gastrointestinal: Denied, gastro-esophageal reflux, melena, nausea and vomiting.  Genitourinary: Denied genitourinary symptoms including symptomatic vaginal discharge, pelvic relaxation issues, and urinary complaints.  Musculoskeletal: Denied musculoskeletal symptoms, stiffness, swelling, muscle weakness and myalgia.  Dermatologic: Denied dermatology symptoms, rash and scar.  Neurologic: Denied neurology symptoms, dizziness, headache, neck pain and syncope.  Psychiatric: Denied psychiatric symptoms, anxiety and depression.  Endocrine: Denied endocrine symptoms including hot flashes and night sweats.   Meds:   Current Outpatient Medications on File Prior to Visit  Medication Sig Dispense Refill   amLODipine (NORVASC) 5 MG tablet TAKE 1 TABLET (5 MG TOTAL) BY MOUTH DAILY. 90 tablet 0   atenolol (TENORMIN) 25 MG tablet TAKE 1 TABLET (25 MG TOTAL) BY MOUTH DAILY. 90 tablet 0   estradiol (ESTRACE) 1 MG tablet Take 1 tablet (1 mg total) by mouth daily. 90 tablet 1   fluticasone (FLONASE) 50 MCG/ACT nasal spray Place 2 sprays into both nostrils every morning.     levonorgestrel (MIRENA) 20 MCG/24HR IUD 1 each by Intrauterine route once.     olmesartan (BENICAR) 40 MG tablet TAKE 1 TABLET BY MOUTH EVERY DAY 90 tablet 0   No current facility-administered medications on file prior  to visit.      Objective:     Vitals:    09/15/23 0834  BP: 124/80  Pulse: 68   Filed Weights   09/15/23 0834  Weight: 251 lb 6.4 oz (114 kg)                        Assessment:    G0P0000 Patient Active Problem List   Diagnosis Date Noted   Elevated hemoglobin A1c 11/18/2022   Hypercholesterolemia 02/11/2021   Leukocytosis    Obesity, Class III, BMI 40-49.9 (morbid obesity) (HCC)    Acute diverticulitis 09/09/2020   Acute lower UTI 09/09/2020   Disorder of skin of trunk 03/18/2018   Displacement of lumbar intervertebral disc without myelopathy 03/18/2018   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   Low back pain 03/18/2018   Vitamin D deficiency 05/15/2017   DUB (dysfunctional uterine bleeding) 03/21/2016   Left ovarian cyst 03/19/2016   Abnormal bleeding in menstrual cycle 03/10/2016   Left elbow tendonitis 11/28/2015   Fatty liver 07/16/2015   Migraine 07/16/2015   Essential hypertension 03/09/2015     1. Menopausal symptoms   2. Hormone replacement therapy (HRT)     Patient now symptom-free on ERT   Plan:            1.  Continue ERT.  2.  Follow-up for annual exam Orders No orders of the defined types were placed in this encounter.   No orders of the defined types were placed in this encounter.     F/U  Return for Annual Physical.  Elonda Husky, M.D. 09/15/2023 8:57 AM

## 2023-09-16 ENCOUNTER — Ambulatory Visit (INDEPENDENT_AMBULATORY_CARE_PROVIDER_SITE_OTHER): Payer: 59

## 2023-09-16 ENCOUNTER — Other Ambulatory Visit: Payer: Self-pay

## 2023-09-16 ENCOUNTER — Ambulatory Visit
Admission: EM | Admit: 2023-09-16 | Discharge: 2023-09-16 | Disposition: A | Discharge status: Home / Self Care | Payer: 59 | Source: Home / Self Care

## 2023-09-16 DIAGNOSIS — R21 Rash and other nonspecific skin eruption: Secondary | ICD-10-CM | POA: Insufficient documentation

## 2023-09-16 DIAGNOSIS — I10 Essential (primary) hypertension: Secondary | ICD-10-CM | POA: Diagnosis not present

## 2023-09-16 DIAGNOSIS — B9789 Other viral agents as the cause of diseases classified elsewhere: Secondary | ICD-10-CM | POA: Diagnosis not present

## 2023-09-16 DIAGNOSIS — R051 Acute cough: Secondary | ICD-10-CM

## 2023-09-16 DIAGNOSIS — R1110 Vomiting, unspecified: Secondary | ICD-10-CM | POA: Diagnosis not present

## 2023-09-16 DIAGNOSIS — J069 Acute upper respiratory infection, unspecified: Secondary | ICD-10-CM | POA: Insufficient documentation

## 2023-09-16 LAB — GROUP A STREP BY PCR: Group A Strep by PCR: NOT DETECTED

## 2023-09-16 MED ORDER — BENZONATATE 100 MG PO CAPS: 200.0000 mg | ORAL_CAPSULE | Freq: Three times a day (TID) | ORAL | 0 refills | Status: DC

## 2023-09-16 MED ORDER — PROMETHAZINE-DM 6.25-15 MG/5ML PO SYRP: 5.0000 mL | ORAL_SOLUTION | Freq: Four times a day (QID) | ORAL | 0 refills | Status: DC | PRN

## 2023-09-16 MED ORDER — TRIAMCINOLONE ACETONIDE 0.5 % EX OINT: 1.0000 | TOPICAL_OINTMENT | Freq: Two times a day (BID) | CUTANEOUS | 0 refills | Status: DC

## 2023-09-16 MED ORDER — IPRATROPIUM BROMIDE 0.06 % NA SOLN: 2.0000 | Freq: Four times a day (QID) | NASAL | 12 refills | Status: DC

## 2023-09-16 NOTE — ED Triage Notes (Addendum)
Pt reports she was dx with URI today and was prescribed some medications (unsure of what) pt reports she has continued to cough , that and the accumulation of mucus is making her vomit

## 2023-09-16 NOTE — ED Provider Notes (Addendum)
[facility]    CSN: [id number] Arrival date & time: 09/16/23  9528      History   Chief Complaint Chief Complaint  Patient presents with   Cough   Rash    HPI Victoria Bowers is a 52 y.o. female.   HPI  52 year old female with a past medical history significant for hypertension, postoperative nausea and vomiting, ovarian cyst, GERD, fatty liver, Bertolotti syndrome and hypercholesterolemia presents for evaluation of 5 days with the respiratory symptoms.  She denies any fever.  She states she has had runny nose, nasal congestion, and sore throat.  She reports that mucus will accumulate in her throat and she will have to cough it/regurgitated up.  She reports that she is also having a productive cough for thick mucus with some shortness of breath and wheezing.  No fever, ear pain, diarrhea.  She states she is able to eat and drink without difficulty.  Past Medical History:  Diagnosis Date   Allergy    Bertolotti's syndrome    Complication of anesthesia    Fatty liver    Fibroid    GERD (gastroesophageal reflux disease)    OCC   Hypertension    Ovarian cyst    PONV (postoperative nausea and vomiting)    NAUSEATED    Patient Active Problem List   Diagnosis Date Noted   Elevated hemoglobin A1c 11/18/2022   Hypercholesterolemia 02/11/2021   Leukocytosis    Obesity, Class III, BMI 40-49.9 (morbid obesity) (HCC)    Acute diverticulitis 09/09/2020   Acute lower UTI 09/09/2020   Disorder of skin of trunk 03/18/2018   Displacement of lumbar intervertebral disc without myelopathy 03/18/2018   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   Low back pain 03/18/2018   Vitamin D deficiency 05/15/2017   DUB (dysfunctional uterine bleeding) 03/21/2016   Left ovarian cyst 03/19/2016   Abnormal bleeding in menstrual cycle 03/10/2016   Left elbow tendonitis 11/28/2015   Fatty liver 07/16/2015   Migraine 07/16/2015   Essential hypertension 03/09/2015     Past Surgical History:  Procedure Laterality Date   CERVICAL POLYPECTOMY     CHOLECYSTECTOMY     COLONOSCOPY WITH PROPOFOL N/A 11/26/2020   Procedure: COLONOSCOPY WITH PROPOFOL;  Surgeon: Wyline Mood, MD;  Location: [facility] ENDOSCOPY;  Service: Gastroenterology;  Laterality: N/A;   COLONOSCOPY WITH PROPOFOL N/A 12/17/2020   Procedure: COLONOSCOPY WITH PROPOFOL;  Surgeon: Wyline Mood, MD;  Location: [facility] ENDOSCOPY;  Service: Gastroenterology;  Laterality: N/A;   HYSTEROSCOPY WITH D & C N/A 03/28/2019   Procedure: DILATATION AND CURETTAGE /HYSTEROSCOPY;  Surgeon: Linzie Collin, MD;  Location: [facility] ORS;  Service: Gynecology;  Laterality: N/A;   INTRAUTERINE DEVICE (IUD) INSERTION N/A 03/28/2019   Procedure: INTRAUTERINE DEVICE (IUD) INSERTION;  Surgeon: Linzie Collin, MD;  Location: [facility] ORS;  Service: Gynecology;  Laterality: N/A;   NASAL SINUS SURGERY  X2   SPINE SURGERY  ruptured disc   LUMBAR   TONSILLECTOMY     TONSILLECTOMY AND ADENOIDECTOMY  1985    OB History     Gravida  0   Para  0   Term  0   Preterm  0   AB  0   Living  0      SAB  0   IAB  0   Ectopic  0   Multiple  0   Live Births  0            Home Medications  Prior to Admission medications   Medication Sig Start Date End Date Taking? Authorizing Provider  amLODipine (NORVASC) 5 MG tablet TAKE 1 TABLET (5 MG TOTAL) BY MOUTH DAILY. 07/29/23  Yes Larae Grooms, NP  atenolol (TENORMIN) 25 MG tablet TAKE 1 TABLET (25 MG TOTAL) BY MOUTH DAILY. 07/29/23  Yes Larae Grooms, NP  benzonatate (TESSALON) 100 MG capsule Take 2 capsules (200 mg total) by mouth every 8 (eight) hours. 09/16/23  Yes Becky Augusta, NP  estradiol (ESTRACE) 1 MG tablet Take 1 tablet (1 mg total) by mouth daily. 07/30/23  Yes Linzie Collin, MD  fluticasone [facility]) 50 MCG/ACT nasal spray Place 2 sprays into both nostrils every morning.   Yes [provider]  ipratropium (ATROVENT) 0.06 % nasal  spray Place 2 sprays into both nostrils 4 (four) times daily. 09/16/23  Yes Becky Augusta, NP  levonorgestrel (MIRENA) 20 MCG/24HR IUD 1 each by Intrauterine route once.   Yes [provider]  olmesartan (BENICAR) 40 MG tablet TAKE 1 TABLET BY MOUTH EVERY DAY 07/29/23  Yes Larae Grooms, NP  promethazine-dextromethorphan (PROMETHAZINE-DM) 6.25-15 MG/5ML syrup Take 5 mLs by mouth 4 (four) times daily as needed. 09/16/23  Yes Becky Augusta, NP  triamcinolone ointment (KENALOG) 0.5 % Apply 1 Application topically 2 (two) times daily. 09/16/23  Yes Becky Augusta, NP    Family History Family History  Problem Relation Age of Onset   Hypertension Father    Hypertension Brother    Hypertension Paternal Grandmother    Hypertension Brother    Heart disease Mother     Social History Social History   Tobacco Use   Smoking status: Never    Passive exposure: Never   Smokeless tobacco: Never  Vaping Use   Vaping status: Never Used  Substance Use Topics   Alcohol use: No    Alcohol/week: 0.0 standard drinks of alcohol   Drug use: No     Allergies   Elemental sulfur and Sulfa antibiotics   Review of Systems Review of Systems  Constitutional:  Negative for fever.  HENT:  Positive for congestion, postnasal drip, rhinorrhea and sore throat. Negative for ear pain.   Respiratory:  Positive for cough, shortness of breath and wheezing.   Gastrointestinal:  Positive for nausea and vomiting. Negative for diarrhea.     Physical Exam Triage Vital Signs ED Triage Vitals  Encounter Vitals Group     BP      Systolic BP Percentile      Diastolic BP Percentile      Pulse      Resp      Temp      Temp src      SpO2      Weight      Height      Head Circumference      Peak Flow      Pain Score      Pain Loc      Pain Education      Exclude from Growth Chart    No data found.  Updated Vital Signs BP 134/70 (BP Location: Left Wrist)   Pulse 62   Temp 98.3 F (36.8 C)  (Oral)   Resp 19   SpO2 100%   Visual Acuity Right Eye Distance:   Left Eye Distance:   Bilateral Distance:    Right Eye Near:   Left Eye Near:    Bilateral Near:     Physical Exam Vitals and nursing note reviewed.  Constitutional:  Appearance: Normal appearance. She is not ill-appearing.  HENT:     Head: Normocephalic and atraumatic.     Right Ear: Tympanic membrane, ear canal and external ear normal. There is no impacted cerumen.     Left Ear: Tympanic membrane, ear canal and external ear normal. There is no impacted cerumen.     Nose: Congestion and rhinorrhea present.     Comments: Patient mucosa is erythematous and edematous with clear discharge in both nares.    Mouth/Throat:     Mouth: Mucous membranes are moist.     Pharynx: Oropharynx is clear. Posterior oropharyngeal erythema present. No oropharyngeal exudate.     Comments: Mild erythema to bilateral tonsillar pillars and posterior pharynx.  No appreciable exudate. Cardiovascular:     Rate and Rhythm: Normal rate and regular rhythm.     Pulses: Normal pulses.     Heart sounds: Normal heart sounds. No murmur heard.    No friction rub. No gallop.  Pulmonary:     Effort: Pulmonary effort is normal.     Breath sounds: Normal breath sounds. No wheezing, rhonchi or rales.  Musculoskeletal:     Cervical back: Normal range of motion and neck supple. No tenderness.  Lymphadenopathy:     Cervical: No cervical adenopathy.  Skin:    General: Skin is warm and dry.     Capillary Refill: Capillary refill takes less than 2 seconds.     Findings: Rash present.     Comments: Erythematous, sandpapery textured rash on left bicep.  Neurological:     General: No focal deficit present.     Mental Status: She is alert and oriented to person, place, and time.      UC Treatments / Results  Labs (all labs ordered are listed, but only abnormal results are displayed) Labs Reviewed  GROUP A STREP BY PCR     EKG   Radiology No results found.  Procedures Procedures (including critical care time)  Medications Ordered in UC Medications - No data to display  Initial Impression / Assessment and Plan / UC Course  I have reviewed the triage vital signs and the nursing notes.  Pertinent labs & imaging results that were available during my care of the patient were reviewed by me and considered in my medical decision making (see chart for details).   Patient is a pleasant, nontoxic-appearing 52 year old female presenting for evaluation of 5 days with respiratory symptoms and skin rash as outlined HPI above.  As you can see in the image above, the rash consists of erythematous, maculopapular patches that have a sandpapery texture.  It is only present on the anterior lateral aspect of her left upper arm and nowhere else.  It is not hot to touch, indurated, or fluctuant.  Her physical exam does reveal signs of an upper respiratory infection as evidenced by the inflamed nasal mucosa with clear rhinorrhea.  Erythema to bilateral tonsillar pillars and soft palate without exudate is also present.  Cardiopulmonary exam is benign.  She reports that she has been vomiting mucus but it sounds more as though she has postnasal drip that is collecting in her throat, causing nausea and tricking her gag reflex versus organic vomiting.  She states that she is able to eat and drink without difficulty.  Given the sandpapery rash and sore throat I will order a strep PCR.  Since her symptoms have been present for last 5 days she is outside the therapeutic window for antivirals for influenza or COVID  so I would not test her for either 1 of those at this time.  I will order a chest x-ray to evaluate for any acute cardiopulmonary pathology given that she has a productive cough and the preponderance of pneumonia in the community.  Chest x-ray independently reviewed and evaluated by me.  Impression: No evidence of infiltrate or  effusion.  Cardiomediastinal silhouette appears normal.  Radiology overread is pending. Radiology impression states no active cardiopulmonary disease.  Strep PCR is negative.  I will discharge patient home with a diagnosis of URI with cough and congestion and nonspecific skin eruption.  I will prescribe triamcinolone 0.5% ointment that she can apply to her rash twice daily as needed for inflammation and itching.  It most likely is a viral exanthem due to her viral URI.  Atrovent nasal spray to help with nasal congestion along with Tessalon Perles and Promethazine DM cough syrup for cough and congestion.  Final Clinical Impressions(s) / UC Diagnoses   Final diagnoses:  Acute cough  URI with cough and congestion  Rash and nonspecific skin eruption     Discharge Instructions      Your strep test was negative and your chest x-ray did not demonstrate any evidence of pneumonia.  I do believe you have a respiratory virus which is causing your symptoms.  Apply the topical triamcinolone ointment twice daily to the rash on your left upper arm as needed for itching and inflammation.  Use over-the-counter Tylenol and/or ibuprofen according the package instructions as needed for any fever or pain.  Use the Atrovent nasal spray, 2 squirts in each nostril every 6 hours, as needed for runny nose and postnasal drip.  Use the Tessalon Perles every 8 hours during the day.  Take them with a small sip of water.  They may give you some numbness to the base of your tongue or a metallic taste in your mouth, this is normal.  Use the Promethazine DM cough syrup at bedtime for cough and congestion.  It will make you drowsy so do not take it during the day.  Return for reevaluation or see your primary care provider for any new or worsening symptoms.      ED Prescriptions     Medication Sig Dispense Auth. Provider   benzonatate (TESSALON) 100 MG capsule Take 2 capsules (200 mg total) by mouth every 8  (eight) hours. 21 capsule Becky Augusta, NP   ipratropium (ATROVENT) 0.06 % nasal spray Place 2 sprays into both nostrils 4 (four) times daily. 15 mL Becky Augusta, NP   promethazine-dextromethorphan (PROMETHAZINE-DM) 6.25-15 MG/5ML syrup Take 5 mLs by mouth 4 (four) times daily as needed. 118 mL Becky Augusta, NP   triamcinolone ointment (KENALOG) 0.5 % Apply 1 Application topically 2 (two) times daily. 30 g Becky Augusta, NP      PDMP not reviewed this encounter.   Becky Augusta, NP 09/16/23 1254    Becky Augusta, NP 09/16/23 1354

## 2023-09-16 NOTE — Discharge Instructions (Addendum)
Your strep test was negative and your chest x-ray did not demonstrate any evidence of pneumonia.  I do believe you have a respiratory virus which is causing your symptoms.  Apply the topical triamcinolone ointment twice daily to the rash on your left upper arm as needed for itching and inflammation.  Use over-the-counter Tylenol and/or ibuprofen according the package instructions as needed for any fever or pain.  Use the Atrovent nasal spray, 2 squirts in each nostril every 6 hours, as needed for runny nose and postnasal drip.  Use the Tessalon Perles every 8 hours during the day.  Take them with a small sip of water.  They may give you some numbness to the base of your tongue or a metallic taste in your mouth, this is normal.  Use the Promethazine DM cough syrup at bedtime for cough and congestion.  It will make you drowsy so do not take it during the day.  Return for reevaluation or see your primary care provider for any new or worsening symptoms.

## 2023-09-16 NOTE — ED Triage Notes (Signed)
09/11/23 cough. congestion, vomiting mucus unsure of fever. No ear pain. Yellow mucus.   Patient states that she's been using OTC mucus relief with no help. Cough meds.   Patient also has a rash on her right bicep that's been there since 12-13. She's been Cortizone 10 cream

## 2023-09-17 ENCOUNTER — Emergency Department
Admission: EM | Admit: 2023-09-17 | Discharge: 2023-09-17 | Disposition: A | Discharge status: Home / Self Care | Payer: 59 | Attending: Emergency Medicine | Admitting: Emergency Medicine

## 2023-09-17 DIAGNOSIS — R1110 Vomiting, unspecified: Secondary | ICD-10-CM

## 2023-09-17 DIAGNOSIS — J069 Acute upper respiratory infection, unspecified: Secondary | ICD-10-CM

## 2023-09-17 NOTE — ED Provider Notes (Signed)
[facility] Provider Note    Event Date/Time   First MD Initiated Contact with Patient 09/17/23 0145     (approximate)   History   Cough   HPI Victoria Bowers is a 52 y.o. female who presents for evaluation of posttussive emesis.  She was seen yesterday at the urgent care and prescribed in her medications for what seems like postnasal drip and a viral illness.  She was prescribed a number of medications including cough medicine and nasal spray, but she said that she continues to vomit after coughing which is very frustrating for her.  She said the Zofran is not helping and she wondered if she could try something different for nausea/vomiting.  However she feels like the medicine may be helping a little bit because her cough is decreasing in severity and frequency.  No fever.  No difficulty breathing, just the frequent cough followed by posttussive emesis.  No abdominal pain and no nausea.     Physical Exam   Triage Vital Signs: ED Triage Vitals  Encounter Vitals Group     BP 09/16/23 2329 132/71     Systolic BP Percentile --      Diastolic BP Percentile --      Pulse Rate 09/16/23 2329 75     Resp 09/16/23 2329 18     Temp 09/16/23 2329 98.5 F (36.9 C)     Temp Source 09/16/23 2329 Oral     SpO2 09/16/23 2329 93 %     Weight --      Height --      Head Circumference --      Peak Flow --      Pain Score 09/16/23 2327 0     Pain Loc --      Pain Education --      Exclude from Growth Chart --     Most recent vital signs: Vitals:   09/16/23 2329 09/17/23 0151  BP: 132/71 (!) 143/95  Pulse: 75 75  Resp: 18 18  Temp: 98.5 F (36.9 C) 98.9 F (37.2 C)  SpO2: 93% 97%    General: Awake, no distress.  CV:  Good peripheral perfusion with normal heart sounds.   Resp:  Normal effort. Speaking easily and comfortably, no accessory muscle usage nor intercostal retractions.  Lungs are clear to auscultation.  Occasional but infrequent  cough. Abd:  No distention.    ED Results / Procedures / Treatments   Labs (all labs ordered are listed, but only abnormal results are displayed) Labs Reviewed - No data to display    RADIOLOGY I viewed and interpreted the chest x-ray performed yesterday at urgent care and I see no evidence of infiltrate.  Radiologist confirmed a normal films   PROCEDURES:  Critical Care performed: No  Procedures    IMPRESSION / MDM / ASSESSMENT AND PLAN / ED COURSE  I reviewed the triage vital signs and the nursing notes.                              Differential diagnosis includes, but is not limited to, viral illness, posttussive emesis, gastritis.  Patient's presentation is most consistent with acute, uncomplicated illness.  Interventions/Medications given:  Medications - No data to display  (Note:  hospital course my include additional interventions and/or labs/studies not listed above.)   Vital signs stable, patient's symptoms are actually improving, but she was hoping that a different  antiemetic would help with the persistent posttussive emesis.  However we talked about it, and she is having no nausea, she is just occasionally getting choked on thick secretions which is causing her to vomit.  After we discussed it, we agree that an antiemetic is unlikely to help since the primary issue is more of a mechanical choking on secretion problem.  I suggest that she use a Neti pot to try and clear out her sinuses is much as possible before she lies down and suggested she prop herself up in bed or in a chair to avoid choking when she is trying to sleep.  She agrees with this plan and will continue taking the medication as she was previously prescribed.  I gave my usual and customary follow-up recommendations and return precautions.         FINAL CLINICAL IMPRESSION(S) / ED DIAGNOSES   Final diagnoses:  Viral URI with cough  Post-tussive emesis     Rx / DC Orders   ED Discharge  Orders     None        Note:  This document was prepared using Dragon voice recognition software and may include unintentional dictation errors.   Loleta Rose, MD 09/17/23 [phone]

## 2023-09-17 NOTE — Discharge Instructions (Addendum)
As we discussed, it is unlikely the different antiemetics will help.  Continue taking the medications you have been prescribed, but you should strongly consider using a Neti pot, or other type of sinus rinse device.  You can find these at any pharmacy and we recommend you use them according to the label instructions, particularly before you lie down to try to sleep.  Follow-up with your regular provider at the next available opportunity.  Return to the emergency department if you develop new or worsening symptoms that concern you.

## 2023-09-18 ENCOUNTER — Ambulatory Visit: Payer: Self-pay

## 2023-09-18 ENCOUNTER — Encounter: Payer: Self-pay | Admitting: Emergency Medicine

## 2023-09-18 ENCOUNTER — Ambulatory Visit
Admission: EM | Admit: 2023-09-18 | Discharge: 2023-09-18 | Disposition: A | Discharge status: Home / Self Care | Payer: 59 | Attending: Physician Assistant | Admitting: Physician Assistant

## 2023-09-18 DIAGNOSIS — J069 Acute upper respiratory infection, unspecified: Secondary | ICD-10-CM

## 2023-09-18 DIAGNOSIS — R1110 Vomiting, unspecified: Secondary | ICD-10-CM

## 2023-09-18 MED ORDER — GUAIFENESIN-CODEINE 100-10 MG/5ML PO SOLN: 10.0000 mL | Freq: Four times a day (QID) | ORAL | 0 refills | Status: DC | PRN

## 2023-09-18 NOTE — ED Provider Notes (Signed)
[facility]    CSN: [id number] Arrival date & time: 09/18/23  1033      History   Chief Complaint Chief Complaint  Patient presents with   Cough    HPI Victoria Bowers is a 52 y.o. female presenting for 1 week history of cough, congestion, and fatigue. Over the past 3 days she has had numerous episodes of post-tussive vomiting. Denies fever, chest pain, sinus pain, wheezing or SOB.   No sick contacts or known exposure to COVID or flu reported. Has been seen twice in the past 2 days for her symptoms.  She was initially seen in this department 2 days ago by another provider.  She had a negative strep test and a normal chest x-ray.  She was given benzonatate, Promethazine DM and Atrovent nasal spray.  Seen the following day in the emergency department.  Reported at that time that Zofran was not helping her nausea and vomiting when she was not feeling any better.  She was advised to use a Nettie pot to help clear her sinuses and follow-up as needed.  Medical history is significant for hypertension, obesity, hyperlipidemia. No history of cardiopulmonary disease.   HPI  Past Medical History:  Diagnosis Date   Allergy    Bertolotti's syndrome    Complication of anesthesia    Fatty liver    Fibroid    GERD (gastroesophageal reflux disease)    OCC   Hypertension    Ovarian cyst    PONV (postoperative nausea and vomiting)    NAUSEATED    Patient Active Problem List   Diagnosis Date Noted   Elevated hemoglobin A1c 11/18/2022   Hypercholesterolemia 02/11/2021   Leukocytosis    Obesity, Class III, BMI 40-49.9 (morbid obesity) (HCC)    Acute diverticulitis 09/09/2020   Acute lower UTI 09/09/2020   Disorder of skin of trunk 03/18/2018   Displacement of lumbar intervertebral disc without myelopathy 03/18/2018   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   Low back pain 03/18/2018   Vitamin D deficiency 05/15/2017   DUB (dysfunctional uterine bleeding)  03/21/2016   Left ovarian cyst 03/19/2016   Abnormal bleeding in menstrual cycle 03/10/2016   Left elbow tendonitis 11/28/2015   Fatty liver 07/16/2015   Migraine 07/16/2015   Essential hypertension 03/09/2015    Past Surgical History:  Procedure Laterality Date   CERVICAL POLYPECTOMY     CHOLECYSTECTOMY     COLONOSCOPY WITH PROPOFOL N/A 11/26/2020   Procedure: COLONOSCOPY WITH PROPOFOL;  Surgeon: Wyline Mood, MD;  Location: [facility] END[facility]COPY;  Service: Gastroenterology;  Laterality: N/A;   COLONOSCOPY WITH PROPOFOL N/A 12/17/2020   Procedure: COLONOSCOPY WITH PROPOFOL;  Surgeon: Wyline Mood, MD;  Location: [facility] ENDOSCOPY;  Service: Gastroenterology;  Laterality: N/A;   HYSTEROSCOPY WITH D & C N/A 03/28/2019   Procedure: DILATATION AND CURETTAGE /HYSTEROSCOPY;  Surgeon: Linzie Collin, MD;  Location: [facility] ORS;  Service: Gynecology;  Laterality: N/A;   INTRAUTERINE DEVICE (IUD) INSERTION N/A 03/28/2019   Procedure: INTRAUTERINE DEVICE (IUD) INSERTION;  Surgeon: Linzie Collin, MD;  Location: [facility] ORS;  Service: Gynecology;  Laterality: N/A;   NASAL SINUS SURGERY  X2   SPINE SURGERY  ruptured disc   LUMBAR   TONSILLECTOMY     TONSILLECTOMY AND ADENOIDECTOMY  1985    OB History     Gravida  0   Para  0   Term  0   Preterm  0   AB  0   Living  0  SAB  0   IAB  0   Ectopic  0   Multiple  0   Live Births  0            Home Medications    Prior to Admission medications   Medication Sig Start Date End Date Taking? Authorizing Provider  guaiFENesin-codeine 100-10 MG/5ML syrup Take 10 mLs by mouth every 6 (six) hours as needed for cough. 09/18/23  Yes Eusebio Friendly B, PA-C  amLODipine (NORVASC) 5 MG tablet TAKE 1 TABLET (5 MG TOTAL) BY MOUTH DAILY. 07/29/23   Larae Grooms, NP  atenolol (TENORMIN) 25 MG tablet TAKE 1 TABLET (25 MG TOTAL) BY MOUTH DAILY. 07/29/23   Larae Grooms, NP  benzonatate (TESSALON) 100 MG capsule Take 2 capsules (200 mg  total) by mouth every 8 (eight) hours. 09/16/23   Becky Augusta, NP  estradiol (ESTRACE) 1 MG tablet Take 1 tablet (1 mg total) by mouth daily. 07/30/23   Linzie Collin, MD  fluticasone (FLONASE) 50 MCG/ACT nasal spray Place 2 sprays into both nostrils every morning.    [provider]  ipratropium (ATROVENT) 0.06 % nasal spray Place 2 sprays into both nostrils 4 (four) times daily. 09/16/23   Becky Augusta, NP  levonorgestrel (MIRENA) 20 MCG/24HR IUD 1 each by Intrauterine route once.    [provider]  olmesartan (BENICAR) 40 MG tablet TAKE 1 TABLET BY MOUTH EVERY DAY 07/29/23   Larae Grooms, NP  triamcinolone ointment (KENALOG) 0.5 % Apply 1 Application topically 2 (two) times daily. 09/16/23   Becky Augusta, NP    Family History Family History  Problem Relation Age of Onset   Hypertension Father    Hypertension Brother    Hypertension Paternal Grandmother    Hypertension Brother    Heart disease Mother     Social History Social History   Tobacco Use   Smoking status: Never    Passive exposure: Never   Smokeless tobacco: Never  Vaping Use   Vaping status: Never Used  Substance Use Topics   Alcohol use: No    Alcohol/week: 0.0 standard drinks of alcohol   Drug use: No     Allergies   Elemental sulfur and Sulfa antibiotics   Review of Systems Review of Systems  Constitutional:  Positive for fatigue. Negative for chills, diaphoresis and fever.  HENT:  Positive for congestion and sore throat. Negative for ear pain, rhinorrhea, sinus pressure, sinus pain and voice change.   Respiratory:  Positive for cough. Negative for shortness of breath and wheezing.   Cardiovascular:  Negative for chest pain.  Gastrointestinal:  Positive for nausea and vomiting. Negative for abdominal pain.  Musculoskeletal:  Negative for arthralgias and myalgias.  Skin:  Negative for rash.  Neurological:  Negative for weakness and headaches.  Hematological:  Negative for  adenopathy.     Physical Exam Triage Vital Signs ED Triage Vitals  Enc Vitals Group     BP      Pulse      Resp      Temp      Temp src      SpO2      Weight      Height      Head Circumference      Peak Flow      Pain Score      Pain Loc      Pain Edu?      Excl. in GC?    No data found.  Updated  Vital Signs BP 134/81 (BP Location: Left Arm)   Pulse 65   Temp 97.9 F (36.6 C) (Oral)   Resp 16   Ht 5\' 4"  (1.626 m)   Wt 251 lb 5.2 oz (114 kg)   SpO2 97%   BMI 43.14 kg/m      Physical Exam Vitals and nursing note reviewed.  Constitutional:      General: She is not in acute distress.    Appearance: Normal appearance. She is not ill-appearing or toxic-appearing.  HENT:     Head: Normocephalic and atraumatic.     Nose: Congestion present.     Mouth/Throat:     Mouth: Mucous membranes are moist.     Pharynx: Oropharynx is clear.  Eyes:     General: No scleral icterus.       Right eye: No discharge.        Left eye: No discharge.     Conjunctiva/sclera: Conjunctivae normal.  Cardiovascular:     Rate and Rhythm: Normal rate and regular rhythm.     Heart sounds: Normal heart sounds.  Pulmonary:     Effort: Pulmonary effort is normal. No respiratory distress.     Breath sounds: Normal breath sounds. No wheezing, rhonchi or rales.  Musculoskeletal:     Cervical back: Neck supple.  Skin:    General: Skin is dry.  Neurological:     General: No focal deficit present.     Mental Status: She is alert. Mental status is at baseline.     Motor: No weakness.     Gait: Gait normal.  Psychiatric:        Mood and Affect: Mood normal.      UC Treatments / Results  Labs (all labs ordered are listed, but only abnormal results are displayed) Labs Reviewed - No data to display  EKG   Radiology No results found.  Procedures Procedures (including critical care time)  Medications Ordered in UC Medications - No data to display  Initial Impression /  Assessment and Plan / UC Course  I have reviewed the triage vital signs and the nursing notes.  Pertinent labs & imaging results that were available during my care of the patient were reviewed by me and considered in my medical decision making (see chart for details).   52 y/o female presents for 1 week history of cough, congestion, fatigue. 3 day history of post tussive vomiting.  Seen at this urgent care 2 days ago in emergency department yesterday for the symptoms.  Had normal chest x-ray and negative strep test.  Has tried Promethazine DM, Atrovent nasal spray and Zofran without relief.  Denies fever, abdominal pain, chest pain, shortness of breath.  BP elevated at 134/81. All other vitals normal and stable. She is in no acute distress. She has mild congestion on exam. Chest is clear to auscultation and heart RRR.   Suspect viral illness and posttussive vomiting.  Prescribed Cheratussin to begin instead of the Promethazine DM.  Hopefully this will control her cough better and she will have less posttussive vomiting.  Explained to her that some viral illnesses can last for several weeks but she should return if she develops a fever or has increased shortness of breath.  Advised to go to ED for any severe acute worsening of her symptoms.   Final Clinical Impressions(s) / UC Diagnoses   Final diagnoses:  Viral URI with cough  Post-tussive vomiting     Discharge Instructions      -  You have a virus.  Your chest x-ray was negative for pneumonia and your lungs are clear. - Your cough and vomiting are related. - I switched you to a stronger cough syrup.  Continue with the nasal spray, increase rest and fluids.  Smaller meals. - If fever or worsening symptoms go back to the emergency department.  Do realize that viral illnesses can last a couple of weeks sometimes.  It can take more time for your illness to run its course.      ED Prescriptions     Medication Sig Dispense Auth. Provider    guaiFENesin-codeine 100-10 MG/5ML syrup Take 10 mLs by mouth every 6 (six) hours as needed for cough. 120 mL Shirlee Latch, PA-C      I have reviewed the PDMP during this encounter.      Shirlee Latch, PA-C 09/18/23 1222

## 2023-09-18 NOTE — Telephone Encounter (Signed)
Message from [place] B sent at 09/18/2023  9:26 AM EST  Summary: vomiting mucus   Pt has sinus issues, with cough, and throwing up mucus         Chief Complaint: excess mucous and sinus pain Symptoms: headache and cheek pain, ears popping sound, nose is blocked, green nasal discharge Frequency: 1 week Pertinent Negatives: Patient denies fever, SOB Disposition: [] ED /[x] Urgent Care (no appt availability in office) / [] Appointment(In office/virtual)/ []  [facility]/ [] Home Care/ [] Refused Recommended Disposition /[] [place] [facility]/ []  Follow-up with PCP Additional Notes: no appt at any clinic  Reason for Disposition  [1] Using nasal washes and pain medicine > 24 hours AND [2] sinus pain (around cheekbone or eye) persists  Answer Assessment - Initial Assessment Questions 1. LOCATION: "Where does it hurt?"      Face on cheeks below eyes  2. ONSET: "When did the sinus pain start?"  (e.g., hours, days)      1 week ago 3. SEVERITY: "How bad is the pain?"   (Scale 1-10; mild, moderate or severe)   - MILD (1-3): doesn't interfere with normal activities    - MODERATE (4-7): interferes with normal activities (e.g., work or school) or awakens from sleep   - SEVERE (8-10): excruciating pain and patient unable to do any normal activities        3/10  5. NASAL CONGESTION: "Is the nose blocked?" If Yes, ask: "Can you open it or must you breathe through your mouth?"     Breathing through mouth  6. NASAL DISCHARGE: "Do you have discharge from your nose?" If so ask, "What color?"     Thick, green 7. FEVER: "Do you have a fever?" If Yes, ask: "What is it, how was it measured, and when did it start?"      no 8. OTHER SYMPTOMS: "Do you have any other symptoms?" (e.g., sore throat, cough, earache, difficulty breathing)     Vomiting mucus, headache, sore throat, ears popping, sinus headache  Protocols used: Sinus Pain or Congestion-A-AH

## 2023-09-18 NOTE — ED Triage Notes (Signed)
Patient c/o ongoing cough and chest congestion for a week.  Patient unsure of fevers. Patient has been taking prescribed cough medicine and nasal spray.  Patient also reports productive green/yellow mucous.

## 2023-09-18 NOTE — Discharge Instructions (Addendum)
-  You have a virus.  Your chest x-ray was negative for pneumonia and your lungs are clear. - Your cough and vomiting are related. - I switched you to a stronger cough syrup.  Continue with the nasal spray, increase rest and fluids.  Smaller meals. - If fever or worsening symptoms go back to the emergency department.  Do realize that viral illnesses can last a couple of weeks sometimes.  It can take more time for your illness to run its course.

## 2023-10-24 ENCOUNTER — Other Ambulatory Visit: Payer: Self-pay | Admitting: Nurse Practitioner

## 2023-10-24 DIAGNOSIS — I10 Essential (primary) hypertension: Secondary | ICD-10-CM

## 2023-10-26 NOTE — Telephone Encounter (Signed)
Requested Prescriptions  Pending Prescriptions Disp Refills   atenolol (TENORMIN) 25 MG tablet [Pharmacy Med Name: ATENOLOL 25 MG TABLET] 90 tablet 0    Sig: TAKE 1 TABLET (25 MG TOTAL) BY MOUTH DAILY.     Cardiovascular: Beta Blockers 2 Passed - 10/26/2023  3:07 PM      Passed - Cr in normal range and within 360 days    Creatinine  Date Value Ref Range Status  03/09/2013 0.92 0.60 - 1.30 mg/dL Final   Creatinine, Ser  Date Value Ref Range Status  08/10/2023 0.92 0.44 - 1.00 mg/dL Final         Passed - Last BP in normal range    BP Readings from Last 1 Encounters:  09/18/23 134/81         Passed - Last Heart Rate in normal range    Pulse Readings from Last 1 Encounters:  09/18/23 65         Passed - Valid encounter within last 6 months    Recent Outpatient Visits           3 months ago Acute cystitis with hematuria   [place] [facility] Larae Grooms, NP   6 months ago Acute bacterial sinusitis   [place] [facility] Mecum, Oswaldo Conroy, PA-C   11 months ago Essential hypertension   [place] [facility] Larae Grooms, NP   1 year ago Acute non-recurrent frontal sinusitis   [place] [facility] Larae Grooms, NP   1 year ago Acute cystitis with hematuria   [place] [facility] Larae Grooms, NP               amLODipine (NORVASC) 5 MG tablet [Pharmacy Med Name: AMLODIPINE BESYLATE 5 MG TAB] 90 tablet 0    Sig: TAKE 1 TABLET (5 MG TOTAL) BY MOUTH DAILY.     Cardiovascular: Calcium Channel Blockers 2 Passed - 10/26/2023  3:07 PM      Passed - Last BP in normal range    BP Readings from Last 1 Encounters:  09/18/23 134/81         Passed - Last Heart Rate in normal range    Pulse Readings from Last 1 Encounters:  09/18/23 65         Passed - Valid encounter within last 6 months    Recent Outpatient Visits           3 months ago Acute cystitis with hematuria   [facility] Larae Grooms, NP   6 months ago Acute bacterial sinusitis   [place] [facility] Mecum, Oswaldo Conroy, PA-C   11 months ago Essential hypertension   [place] [facility] Larae Grooms, NP   1 year ago Acute non-recurrent frontal sinusitis   [place] [facility] Larae Grooms, NP   1 year ago Acute cystitis with hematuria   [place] [facility] Larae Grooms, NP               olmesartan (BENICAR) 40 MG tablet [Pharmacy Med Name: OLMESARTAN MEDOXOMIL 40 MG TAB] 90 tablet 0    Sig: TAKE 1 TABLET BY MOUTH EVERY DAY     Cardiovascular:  Angiotensin Receptor Blockers Passed - 10/26/2023  3:07 PM      Passed - Cr in normal range and within 180 days    Creatinine  Date Value Ref Range Status  03/09/2013 0.92 0.60 -  1.30 mg/dL Final   Creatinine, Ser  Date Value Ref Range Status  08/10/2023 0.92 0.44 - 1.00 mg/dL Final         Passed - K in normal range and within 180 days    Potassium  Date Value Ref Range Status  08/10/2023 4.0 3.5 - 5.1 mmol/L Final  03/09/2013 3.7 3.5 - 5.1 mmol/L Final         Passed - Patient is not pregnant      Passed - Last BP in normal range    BP Readings from Last 1 Encounters:  09/18/23 134/81         Passed - Valid encounter within last 6 months    Recent Outpatient Visits           3 months ago Acute cystitis with hematuria   [facility] Larae Grooms, NP   6 months ago Acute bacterial sinusitis   [place] [facility] Mecum, Oswaldo Conroy, PA-C   11 months ago Essential hypertension   [place] [facility] Larae Grooms, NP   1 year ago Acute non-recurrent frontal sinusitis   [place] [facility] Larae Grooms, NP   1 year ago Acute cystitis with hematuria   [place] [facility] Larae Grooms, NP

## 2024-01-20 ENCOUNTER — Other Ambulatory Visit: Payer: Self-pay | Admitting: Obstetrics and Gynecology

## 2024-01-20 DIAGNOSIS — N951 Menopausal and female climacteric states: Secondary | ICD-10-CM

## 2024-01-23 ENCOUNTER — Emergency Department
Admission: EM | Admit: 2024-01-23 | Discharge: 2024-01-24 | Disposition: A | Discharge status: Home / Self Care | Attending: Emergency Medicine | Admitting: Emergency Medicine

## 2024-01-23 ENCOUNTER — Other Ambulatory Visit: Payer: Self-pay | Admitting: Nurse Practitioner

## 2024-01-23 ENCOUNTER — Other Ambulatory Visit: Payer: Self-pay

## 2024-01-23 DIAGNOSIS — I10 Essential (primary) hypertension: Secondary | ICD-10-CM

## 2024-01-23 DIAGNOSIS — K625 Hemorrhage of anus and rectum: Secondary | ICD-10-CM | POA: Diagnosis present

## 2024-01-23 LAB — COMPREHENSIVE METABOLIC PANEL WITH GFR
ALT: 20 U/L (ref 0–44)
AST: 23 U/L (ref 15–41)
Albumin: 3.6 g/dL (ref 3.5–5.0)
Alkaline Phosphatase: 76 U/L (ref 38–126)
Anion gap: 10 (ref 5–15)
BUN: 18 mg/dL (ref 6–20)
CO2: 22 mmol/L (ref 22–32)
Calcium: 9.2 mg/dL (ref 8.9–10.3)
Chloride: 102 mmol/L (ref 98–111)
Creatinine, Ser: 0.97 mg/dL (ref 0.44–1.00)
GFR, Estimated: >60 mL/min (ref >60)
Glucose, Bld: 154 mg/dL — ABNORMAL HIGH (ref 70–99)
Potassium: 3.9 mmol/L (ref 3.5–5.1)
Sodium: 134 mmol/L — ABNORMAL LOW (ref 135–145)
Total Bilirubin: 0.5 mg/dL (ref 0.0–1.2)
Total Protein: 7.1 g/dL (ref 6.5–8.1)

## 2024-01-23 LAB — CBC
HCT: 38 % (ref 36.0–46.0)
Hemoglobin: 13 g/dL (ref 12.0–15.0)
MCH: 31 pg (ref 26.0–34.0)
MCHC: 34.2 g/dL (ref 30.0–36.0)
MCV: 90.7 fL (ref 80.0–100.0)
Platelets: 326 10*3/uL (ref 150–400)
RBC: 4.19 MIL/uL (ref 3.87–5.11)
RDW: 12.5 % (ref 11.5–15.5)
WBC: 11.7 10*3/uL — ABNORMAL HIGH (ref 4.0–10.5)
nRBC: 0 % (ref 0.0–0.2)

## 2024-01-23 LAB — TYPE AND SCREEN
ABO/RH(D): A POS
Antibody Screen: NEGATIVE

## 2024-01-23 LAB — POC OCCULT BLOOD, ED: Fecal Occult Blood: POSITIVE — AB

## 2024-01-23 LAB — HEMOGLOBIN AND HEMATOCRIT, BLOOD
HCT: 39.6 % (ref 36.0–46.0)
Hemoglobin: 13.2 g/dL (ref 12.0–15.0)

## 2024-01-23 LAB — POC URINE PREG, ED: Preg Test, Ur: NEGATIVE

## 2024-01-23 NOTE — Discharge Instructions (Addendum)
 Been diagnosed with rectal bleeding.  Please call [name] and make an appointment for follow-up.  Come back to ED or go to your PCP if you have new symptoms or symptoms worsen

## 2024-01-23 NOTE — ED Provider Notes (Signed)
 ----------------------------------------- 9:23 PM on 01/23/2024 -----------------------------------------  Blood pressure (!) 143/84, pulse 78, temperature 99.3 F (37.4 C), temperature source Oral, resp. rate 16, height 5\' 4"  (1.626 m), SpO2 98%.  Assuming care from Dr. Cleora Daft, In short, Victoria Bowers is a 53 y.o. female with a chief complaint of Rectal Bleeding .  Refer to the original H&P for additional details.  The current plan of care is to wait for report of hemoglobin to determine disposition.  ____________________________________________    ED Results / Procedures / Treatments   Labs (all labs ordered are listed, but only abnormal results are displayed) Labs Reviewed  COMPREHENSIVE METABOLIC PANEL WITH GFR - Abnormal; Notable for the following components:      Result Value   Sodium 134 (*)    Glucose, Bld 154 (*)    All other components within normal limits  CBC - Abnormal; Notable for the following components:   WBC 11.7 (*)    All other components within normal limits  POC OCCULT BLOOD, ED - Abnormal; Notable for the following components:   Fecal Occult Blood Positive (*)    All other components within normal limits  HEMOGLOBIN AND HEMATOCRIT, BLOOD  POC URINE PREG, ED  TYPE AND SCREEN     EKG     RADIOLOGY  i personally viewed and evaluated these images as part of my medical decision making, as well as reviewing the written report by the radiologist.  ED Provider Interpretation:   No results found.   PROCEDURES:  Critical Care performed: No  Procedures   MEDICATIONS ORDERED IN ED: Medications - No data to display   IMPRESSION / MDM / ASSESSMENT AND PLAN / ED COURSE  I reviewed the triage vital signs and the nursing notes.                              Differential diagnosis includes, but is not limited to, hemorrhoids, constipation, diverticulitis, rectal bleeding  Patient's presentation is most consistent with acute complicated  illness / injury requiring diagnostic workup.  Patient's diagnosis is consistent with rectal bleeding. Patient will be discharged home without prescriptions. Patient is to follow up with Dr. Luan Rumpf, [name] as needed or otherwise directed. Patient is given ED precautions to return to the ED for any worsening or new symptoms.  Clinical Course as of 01/24/24 0005  Sat Jan 23, 2024  2125 POC occult blood, ED(!) Positive [AE]  2125 Comprehensive metabolic panel(!) Mild hyponatremia, sodium 134 [AE]  2125 POC urine preg, ED Negative [AE]  2125 Type and screen [facility] A  positive [AE]  2125 CBC(!) [AE]  2126 Hemoglobin is 13, stable compared with her hemoglobin from 5 months ago [AE]  2323 Hemoglobin and hematocrit, blood Hemoglobin 13.2, stable [AE]  2355 Updated patient about hemoglobin being stable after 4 hours.  I will refer patient to GI.  Patient states she is patient from [name].  Patient agreed with the plan [AE]    Clinical Course User Index [AE] Victoria Lennox, PA-C    FINAL CLINICAL IMPRESSION(S) / ED DIAGNOSES   Final diagnoses:  Rectal bleeding     Rx / DC Orders   ED Discharge Orders     None        Note:  This document was prepared using Dragon voice recognition software and may include unintentional dictation errors.    Victoria Lennox, PA-C 01/24/24 0005  Bowers, Victoria K, MD 02/11/24 [phone]

## 2024-01-23 NOTE — ED Triage Notes (Addendum)
 Pt to ed from home via POV for rectal bleeding, dark red in a scant amount. Pt had a BM today and then the second BM was nothing but a "big bloody streak in the toilet" per the pt. Pt is caox4, in no acute distress and ambulatory in triage. Pt denies blood thinners. Pt denies any pain when having a BM and denies HX of bleeding in the past.

## 2024-01-23 NOTE — ED Provider Notes (Signed)
 [facility] Provider Note    Event Date/Time   First MD Initiated Contact with Patient 01/23/24 1849     (approximate)   History   Chief Complaint Rectal Bleeding   HPI  Victoria Bowers is a 53 y.o. female with past medical history of hypertension, hyperlipidemia, and migraines who presents to the ED complaining of rectal bleeding.  Patient reports that shortly after waking up this morning she noticed a small amount of bright red blood when she had a bowel movement.  She did not think much of it due to the mild amount of bleeding, but became more concerned when she passed a large amount of bright red blood about an hour prior to arrival.  She denies any associated pain in her abdomen or rectal area, has never had similar bleeding in the past.  She does not take a blood thinner and denies any lightheadedness, dizziness, chest pain, or shortness of breath.  She has not passed any more blood since the episode about an hour prior to arrival.     Physical Exam   Triage Vital Signs: ED Triage Vitals  Encounter Vitals Group     BP 01/23/24 1814 (!) 143/84     Systolic BP Percentile --      Diastolic BP Percentile --      Pulse Rate 01/23/24 1814 78     Resp 01/23/24 1814 16     Temp 01/23/24 1814 99.3 F (37.4 C)     Temp Source 01/23/24 1814 Oral     SpO2 01/23/24 1814 98 %     Weight --      Height 01/23/24 1815 5\' 4"  (1.626 m)     Head Circumference --      Peak Flow --      Pain Score 01/23/24 1815 0     Pain Loc --      Pain Education --      Exclude from Growth Chart --     Most recent vital signs: Vitals:   01/23/24 1814  BP: (!) 143/84  Pulse: 78  Resp: 16  Temp: 99.3 F (37.4 C)  SpO2: 98%    Constitutional: Alert and oriented. Eyes: Conjunctivae are normal. Head: Atraumatic. Nose: No congestion/rhinnorhea. Mouth/Throat: Mucous membranes are moist.  Cardiovascular: Normal rate, regular rhythm. Grossly normal heart sounds.  2+  radial pulses bilaterally. Respiratory: Normal respiratory effort.  No retractions. Lungs CTAB. Gastrointestinal: Soft and nontender. No distention.  Rectal exam with small amount of bright red blood, no hemorrhoids or fissures noted. Musculoskeletal: No lower extremity tenderness nor edema.  Neurologic:  Normal speech and language. No gross focal neurologic deficits are appreciated.    ED Results / Procedures / Treatments   Labs (all labs ordered are listed, but only abnormal results are displayed) Labs Reviewed  COMPREHENSIVE METABOLIC PANEL WITH GFR - Abnormal; Notable for the following components:      Result Value   Sodium 134 (*)    Glucose, Bld 154 (*)    All other components within normal limits  CBC - Abnormal; Notable for the following components:   WBC 11.7 (*)    All other components within normal limits  POC OCCULT BLOOD, ED - Abnormal; Notable for the following components:   Fecal Occult Blood Positive (*)    All other components within normal limits  HEMOGLOBIN AND HEMATOCRIT, BLOOD  POC URINE PREG, ED  TYPE AND SCREEN    PROCEDURES:  Critical Care performed:  No  Procedures   MEDICATIONS ORDERED IN ED: Medications - No data to display   IMPRESSION / MDM / ASSESSMENT AND PLAN / ED COURSE  I reviewed the triage vital signs and the nursing notes.                              53 y.o. female with past medical history of hypertension, hyperlipidemia, and migraines who presents to the ED complaining of small amount of rectal bleeding earlier this morning, followed by larger episode this evening.  Patient's presentation is most consistent with acute presentation with potential threat to life or bodily function.  Differential diagnosis includes, but is not limited to, upper GI bleed, lower GI bleed, rectal bleeding, hemorrhoids, anal fissure, anemia.  Patient nontoxic-appearing and in no acute distress, vital signs are unremarkable.  She has a benign abdominal  exam, rectal exam with small amount of bright red blood but no obvious active bleeding at this time.  Labs are reassuring with no significant anemia, leukocytosis, electrolyte abnormality, or AKI.  LFTs are also unremarkable, plan to observe for ongoing bleeding and repeat H&H.  Patient turned over to oncoming provider, if H&H remains stable with no ongoing bleeding then patient would be appropriate for discharge home with outpatient GI follow-up.      FINAL CLINICAL IMPRESSION(S) / ED DIAGNOSES   Final diagnoses:  Rectal bleeding     Rx / DC Orders   ED Discharge Orders     None        Note:  This document was prepared using Dragon voice recognition software and may include unintentional dictation errors.   Twilla Galea, MD 01/23/24 [phone]

## 2024-01-23 NOTE — ED Provider Notes (Incomplete)
-----------------------------------------   9:23 PM on 01/23/2024 -----------------------------------------  Blood pressure (!) 143/84, pulse 78, temperature 99.3 F (37.4 C), temperature source Oral, resp. rate 16, height 5\' 4"  (1.626 m), SpO2 98%.  Assuming care from Dr. Cleora Daft, In short, Victoria Bowers is a 53 y.o. female with a chief complaint of Rectal Bleeding .  Refer to the original H&P for additional details.  The current plan of care is to wait for report of hemoglobin to determine disposition.  ____________________________________________    ED Results / Procedures / Treatments   Labs (all labs ordered are listed, but only abnormal results are displayed) Labs Reviewed  COMPREHENSIVE METABOLIC PANEL WITH GFR - Abnormal; Notable for the following components:      Result Value   Sodium 134 (*)    Glucose, Bld 154 (*)    All other components within normal limits  CBC - Abnormal; Notable for the following components:   WBC 11.7 (*)    All other components within normal limits  POC OCCULT BLOOD, ED - Abnormal; Notable for the following components:   Fecal Occult Blood Positive (*)    All other components within normal limits  HEMOGLOBIN AND HEMATOCRIT, BLOOD  POC URINE PREG, ED  TYPE AND SCREEN     EKG     RADIOLOGY  i personally viewed and evaluated these images as part of my medical decision making, as well as reviewing the written report by the radiologist.  ED Provider Interpretation:   No results found.   PROCEDURES:  Critical Care performed: No  Procedures   MEDICATIONS ORDERED IN ED: Medications - No data to display   IMPRESSION / MDM / ASSESSMENT AND PLAN / ED COURSE  I reviewed the triage vital signs and the nursing notes.                              Differential diagnosis includes, but is not limited to, hemorrhoids, constipation, diverticulitis, rectal bleeding  Patient's presentation is most consistent with acute complicated  illness / injury requiring diagnostic workup.  Patient's diagnosis is consistent with rectal bleeding. Patient will be discharged home without prescriptions. Patient is to follow up with Dr. Luan Rumpf, [name] as needed or otherwise directed. Patient is given ED precautions to return to the ED for any worsening or new symptoms.  Clinical Course as of 01/24/24 0001  Sat Jan 23, 2024  2125 POC occult blood, ED(!) Positive [AE]  2125 Comprehensive metabolic panel(!) Mild hyponatremia, sodium 134 [AE]  2125 POC urine preg, ED Negative [AE]  2125 Type and screen [facility] A  positive [AE]  2125 CBC(!) [AE]  2126 Hemoglobin is 13, stable compared with her hemoglobin from 5 months ago [AE]  2323 Hemoglobin and hematocrit, blood Hemoglobin 13.2, stable [AE]  2355 Updated patient about hemoglobin being stable after 4 hours.  I will refer patient to GI.  Patient states she is patient from [name].  Patient agreed with the plan [AE]    Clinical Course User Index [AE] [name], PA-C    FINAL CLINICAL IMPRESSION(S) / ED DIAGNOSES   Final diagnoses:  Rectal bleeding     Rx / DC Orders   ED Discharge Orders     None        Note:  This document was prepared using Dragon voice recognition software and may include unintentional dictation errors.

## 2024-01-24 NOTE — ED Notes (Signed)
 Pt verbalizes understanding of discharge instructions.

## 2024-01-25 ENCOUNTER — Ambulatory Visit: Admitting: Gastroenterology

## 2024-01-25 NOTE — Telephone Encounter (Signed)
 OFFICE VISIT NEEDED FOR ADDITIONAL REFILLS.  Requested Prescriptions  Pending Prescriptions Disp Refills   amLODipine  (NORVASC ) 5 MG tablet [Pharmacy Med Name: AMLODIPINE  BESYLATE 5 MG TAB] 30 tablet 0    Sig: TAKE 1 TABLET (5 MG TOTAL) BY MOUTH DAILY.     Cardiovascular: Calcium Channel Blockers 2 Failed - 01/25/2024 12:08 PM      Failed - Valid encounter within last 6 months    Recent Outpatient Visits   None     Future Appointments             Today Victoria Salaam, MD [facility] - Last BP in normal range    BP Readings from Last 1 Encounters:  01/23/24 136/70         Passed - Last Heart Rate in normal range    Pulse Readings from Last 1 Encounters:  01/23/24 (!) 56          olmesartan  (BENICAR ) 40 MG tablet [Pharmacy Med Name: OLMESARTAN  MEDOXOMIL 40 MG TAB] 30 tablet 0    Sig: TAKE 1 TABLET BY MOUTH EVERY DAY     Cardiovascular:  Angiotensin Receptor Blockers Failed - 01/25/2024 12:08 PM      Failed - Valid encounter within last 6 months    Recent Outpatient Visits   None     Future Appointments             Today Victoria Salaam, MD [facility] [place] [facility] at [facility] - Cr in normal range and within 180 days    Creatinine  Date Value Ref Range Status  03/09/2013 0.92 0.60 - 1.30 mg/dL Final   Creatinine, Ser  Date Value Ref Range Status  01/23/2024 0.97 0.44 - 1.00 mg/dL Final         Passed - K in normal range and within 180 days    Potassium  Date Value Ref Range Status  01/23/2024 3.9 3.5 - 5.1 mmol/L Final  03/09/2013 3.7 3.5 - 5.1 mmol/L Final         Passed - Patient is not pregnant      Passed - Last BP in normal range    BP Readings from Last 1 Encounters:  01/23/24 136/70          atenolol  (TENORMIN ) 25 MG tablet [Pharmacy Med Name: ATENOLOL  25 MG TABLET] 30 tablet 0    Sig: TAKE 1 TABLET (25 MG TOTAL) BY MOUTH DAILY.     Cardiovascular:  Beta Blockers 2 Failed - 01/25/2024 12:08 PM      Failed - Valid encounter within last 6 months    Recent Outpatient Visits   None     Future Appointments             Today Victoria Salaam, MD [facility] [place] [facility] at [facility] - Cr in normal range and within 360 days    Creatinine  Date Value Ref Range Status  03/09/2013 0.92 0.60 - 1.30 mg/dL Final   Creatinine, Ser  Date Value Ref Range Status  01/23/2024 0.97 0.44 - 1.00 mg/dL Final         Passed - Last BP in normal range    BP Readings from Last 1 Encounters:  01/23/24 136/70  Passed - Last Heart Rate in normal range    Pulse Readings from Last 1 Encounters:  01/23/24 (!) 56

## 2024-02-23 ENCOUNTER — Other Ambulatory Visit: Payer: Self-pay | Admitting: Nurse Practitioner

## 2024-02-25 NOTE — Telephone Encounter (Signed)
 Requested medications are due for refill today.  yes  Requested medications are on the active medications list.  yes  Last refill. 01/25/2024 #30 0 rf  Future visit scheduled.   no  Notes to clinic.  Pt is more than 3 months overdue for an OV. Pt already given a courtesy refill.    Requested Prescriptions  Pending Prescriptions Disp Refills   olmesartan  (BENICAR ) 40 MG tablet [Pharmacy Med Name: OLMESARTAN  MEDOXOMIL 40 MG TAB] 90 tablet 1    Sig: TAKE 1 TABLET BY MOUTH EVERY DAY     Cardiovascular:  Angiotensin Receptor Blockers Failed - 02/25/2024  2:32 PM      Failed - Valid encounter within last 6 months    Recent Outpatient Visits   None            Passed - Cr in normal range and within 180 days    Creatinine  Date Value Ref Range Status  03/09/2013 0.92 0.60 - 1.30 mg/dL Final   Creatinine, Ser  Date Value Ref Range Status  01/23/2024 0.97 0.44 - 1.00 mg/dL Final         Passed - K in normal range and within 180 days    Potassium  Date Value Ref Range Status  01/23/2024 3.9 3.5 - 5.1 mmol/L Final  03/09/2013 3.7 3.5 - 5.1 mmol/L Final         Passed - Patient is not pregnant      Passed - Last BP in normal range    BP Readings from Last 1 Encounters:  01/23/24 136/70

## 2024-02-27 ENCOUNTER — Other Ambulatory Visit: Payer: Self-pay | Admitting: Nurse Practitioner

## 2024-02-27 DIAGNOSIS — I10 Essential (primary) hypertension: Secondary | ICD-10-CM

## 2024-02-29 NOTE — Telephone Encounter (Signed)
 Patient is overdue for an appointment. Please call to schedule and then route to provider for possible courtesy refill.

## 2024-02-29 NOTE — Telephone Encounter (Signed)
 Called patient, left message for patient to call back and schedule appt.

## 2024-02-29 NOTE — Telephone Encounter (Signed)
 Requested medications are due for refill today.  yes  Requested medications are on the active medications list.  yes  Last refill. 01/25/2024 330 0 rf for all 3  Future visit scheduled.   no  Notes to clinic.  Pt already given courtesy refill. Please review for refill.    Requested Prescriptions  Pending Prescriptions Disp Refills   atenolol  (TENORMIN ) 25 MG tablet [Pharmacy Med Name: ATENOLOL  25 MG TABLET] 30 tablet 0    Sig: TAKE 1 TABLET (25 MG TOTAL) BY MOUTH DAILY.     Cardiovascular: Beta Blockers 2 Failed - 02/29/2024  1:50 PM      Failed - Valid encounter within last 6 months    Recent Outpatient Visits   None            Passed - Cr in normal range and within 360 days    Creatinine  Date Value Ref Range Status  03/09/2013 0.92 0.60 - 1.30 mg/dL Final   Creatinine, Ser  Date Value Ref Range Status  01/23/2024 0.97 0.44 - 1.00 mg/dL Final         Passed - Last BP in normal range    BP Readings from Last 1 Encounters:  01/23/24 136/70         Passed - Last Heart Rate in normal range    Pulse Readings from Last 1 Encounters:  01/23/24 (!) 56          amLODipine  (NORVASC ) 5 MG tablet [Pharmacy Med Name: AMLODIPINE  BESYLATE 5 MG TAB] 30 tablet 0    Sig: TAKE 1 TABLET (5 MG TOTAL) BY MOUTH DAILY.     Cardiovascular: Calcium Channel Blockers 2 Failed - 02/29/2024  1:50 PM      Failed - Valid encounter within last 6 months    Recent Outpatient Visits   None            Passed - Last BP in normal range    BP Readings from Last 1 Encounters:  01/23/24 136/70         Passed - Last Heart Rate in normal range    Pulse Readings from Last 1 Encounters:  01/23/24 (!) 56          olmesartan  (BENICAR ) 40 MG tablet [Pharmacy Med Name: OLMESARTAN  MEDOXOMIL 40 MG TAB] 30 tablet 0    Sig: TAKE 1 TABLET BY MOUTH EVERY DAY     Cardiovascular:  Angiotensin Receptor Blockers Failed - 02/29/2024  1:50 PM      Failed - Valid encounter within last 6 months    Recent  Outpatient Visits   None            Passed - Cr in normal range and within 180 days    Creatinine  Date Value Ref Range Status  03/09/2013 0.92 0.60 - 1.30 mg/dL Final   Creatinine, Ser  Date Value Ref Range Status  01/23/2024 0.97 0.44 - 1.00 mg/dL Final         Passed - K in normal range and within 180 days    Potassium  Date Value Ref Range Status  01/23/2024 3.9 3.5 - 5.1 mmol/L Final  03/09/2013 3.7 3.5 - 5.1 mmol/L Final         Passed - Patient is not pregnant      Passed - Last BP in normal range    BP Readings from Last 1 Encounters:  01/23/24 136/70

## 2024-03-01 NOTE — Telephone Encounter (Signed)
 Appt scheduled

## 2024-03-07 ENCOUNTER — Ambulatory Visit
Admission: RE | Admit: 2024-03-07 | Discharge: 2024-03-07 | Disposition: A | Discharge status: Home / Self Care | Source: Ambulatory Visit | Attending: Nurse Practitioner | Admitting: Nurse Practitioner

## 2024-03-07 ENCOUNTER — Encounter: Payer: Self-pay | Admitting: Nurse Practitioner

## 2024-03-07 ENCOUNTER — Ambulatory Visit: Admitting: Nurse Practitioner

## 2024-03-07 ENCOUNTER — Ambulatory Visit
Admission: RE | Admit: 2024-03-07 | Discharge: 2024-03-07 | Disposition: A | Discharge status: Home / Self Care | Attending: Nurse Practitioner | Admitting: Nurse Practitioner

## 2024-03-07 VITALS — BP 130/69 | HR 65 | Temp 97.7°F | Wt 253.2 lb

## 2024-03-07 DIAGNOSIS — M79671 Pain in right foot: Secondary | ICD-10-CM

## 2024-03-07 DIAGNOSIS — E66813 Obesity, class 3: Secondary | ICD-10-CM | POA: Diagnosis not present

## 2024-03-07 DIAGNOSIS — I10 Essential (primary) hypertension: Secondary | ICD-10-CM | POA: Diagnosis not present

## 2024-03-07 DIAGNOSIS — E7800 Pure hypercholesterolemia, unspecified: Secondary | ICD-10-CM

## 2024-03-07 DIAGNOSIS — R7309 Other abnormal glucose: Secondary | ICD-10-CM

## 2024-03-07 DIAGNOSIS — Z0000 Encounter for general adult medical examination without abnormal findings: Secondary | ICD-10-CM

## 2024-03-07 DIAGNOSIS — Z1231 Encounter for screening mammogram for malignant neoplasm of breast: Secondary | ICD-10-CM

## 2024-03-07 MED ORDER — ATENOLOL 25 MG PO TABS: 25.0000 mg | ORAL_TABLET | Freq: Every day | ORAL | 0 refills | Status: DC

## 2024-03-07 MED ORDER — AMLODIPINE BESYLATE 5 MG PO TABS: 5.0000 mg | ORAL_TABLET | Freq: Every day | ORAL | 1 refills | Status: DC

## 2024-03-07 MED ORDER — OLMESARTAN MEDOXOMIL 40 MG PO TABS: 40.0000 mg | ORAL_TABLET | Freq: Every day | ORAL | 1 refills | Status: DC

## 2024-03-07 MED ORDER — OLMESARTAN MEDOXOMIL 40 MG PO TABS: 40.0000 mg | ORAL_TABLET | Freq: Every day | ORAL | 0 refills | Status: DC

## 2024-03-07 MED ORDER — AMLODIPINE BESYLATE 5 MG PO TABS: 5.0000 mg | ORAL_TABLET | Freq: Every day | ORAL | 0 refills | Status: DC

## 2024-03-07 MED ORDER — ATENOLOL 25 MG PO TABS: 25.0000 mg | ORAL_TABLET | Freq: Every day | ORAL | 1 refills | Status: DC

## 2024-03-07 NOTE — Patient Instructions (Signed)
 Please call to schedule your mammogram and/or bone density: [facility]  Address: [address], [place], [place] [address] Phone: [phone]  [facility] [address] [place],  [place]  [address] Phone: [phone]

## 2024-03-07 NOTE — Progress Notes (Unsigned)
 BP 130/69   Pulse 65   Temp 97.7 F (36.5 C) (Oral)   Wt 253 lb 3.2 oz (114.9 kg)   SpO2 98%   BMI 43.46 kg/m    Subjective:    Patient ID: Victoria Bowers, female    DOB: 13-Sep-1971, 53 y.o.   MRN: [id number]  HPI: Victoria Bowers is a 53 y.o. female presenting on 03/07/2024 for comprehensive medical examination. Current medical complaints include:none  She currently lives with: Husband Menopausal Symptoms: Not since having IUD placed.  HYPERTENSION / HYPERLIPIDEMIA Satisfied with current treatment? no Duration of hypertension: years BP monitoring frequency: using her watch to check her blood pressure BP range: 112/70 BP medication side effects: no Past BP meds: amlodipine , atenolol  and olmesartan  (benicar ) Duration of hyperlipidemia: years Cholesterol medication side effects: no Cholesterol supplements:none Past cholesterol medications: none Medication compliance: excellent compliance Aspirin: no Recent stressors: no Recurrent headaches: no Visual changes: no Palpitations: no Dyspnea: no Chest pain: no Lower extremity edema: no Dizzy/lightheaded: no  FOOT PAIN Patient states she has been having right foot pain for about two months.  States it is in the right heel.  Pain is worse in the mornings.  She is seeing a Land. She isn't seeing much relief.  She did get new inserts in her shoes which did help alleviate some of it.  She is taking ibuprofen to help with the symptoms.  She has been icing the bottom of the foot which does help some.      Depression Screen done today and results listed below:     03/07/2024    3:49 PM 07/08/2023    3:09 PM 11/18/2022    3:49 PM 08/18/2022   11:06 AM 07/09/2022    1:13 PM  Depression screen PHQ 2/9  Decreased Interest 0 0 0 0 0  Down, Depressed, Hopeless 0 0 0 0 0  PHQ - 2 Score 0 0 0 0 0  Altered sleeping 0 0 1 0 0  Tired, decreased energy 0 0 0 1 1  Change in appetite 1 1 1  0 0  Feeling bad or failure about  yourself  0 0 0 0 0  Trouble concentrating 0 0 0 0 0  Moving slowly or fidgety/restless 0 0 0 0 0  Suicidal thoughts 0 0 0 0 0  PHQ-9 Score 1 1 2 1 1   Difficult doing work/chores Not difficult at all  Not difficult at all Not difficult at all Somewhat difficult    The patient does not have a history of falls. I did complete a risk assessment for falls. A plan of care for falls was documented.   Past Medical History:  Past Medical History:  Diagnosis Date   Allergy    Bertolotti's syndrome    Complication of anesthesia    Fatty liver    Fibroid    GERD (gastroesophageal reflux disease)    OCC   Hypertension    Ovarian cyst    PONV (postoperative nausea and vomiting)    NAUSEATED    Surgical History:  Past Surgical History:  Procedure Laterality Date   CERVICAL POLYPECTOMY     CHOLECYSTECTOMY     COLONOSCOPY WITH PROPOFOL  N/A 11/26/2020   Procedure: COLONOSCOPY WITH PROPOFOL ;  Surgeon: Luke Salaam, MD;  Location: [facility] ENDOSCOPY;  Service: Gastroenterology;  Laterality: N/A;   COLONOSCOPY WITH PROPOFOL  N/A 12/17/2020   Procedure: COLONOSCOPY WITH PROPOFOL ;  Surgeon: Luke Salaam, MD;  Location: [facility] ENDOSCOPY;  Service: Gastroenterology;  Laterality:  N/A;   HYSTEROSCOPY WITH D & C N/A 03/28/2019   Procedure: DILATATION AND CURETTAGE /HYSTEROSCOPY;  Surgeon: Zenobia Hila, MD;  Location: [facility] ORS;  Service: Gynecology;  Laterality: N/A;   INTRAUTERINE DEVICE (IUD) INSERTION N/A 03/28/2019   Procedure: INTRAUTERINE DEVICE (IUD) INSERTION;  Surgeon: Zenobia Hila, MD;  Location: [facility] ORS;  Service: Gynecology;  Laterality: N/A;   NASAL SINUS SURGERY  X2   SPINE SURGERY  ruptured disc   LUMBAR   TONSILLECTOMY     TONSILLECTOMY AND ADENOIDECTOMY  1985    Medications:  Current Outpatient Medications on File Prior to Visit  Medication Sig   estradiol  (ESTRACE ) 1 MG tablet TAKE 1 TABLET BY MOUTH EVERY DAY   fluticasone  (FLONASE ) 50 MCG/ACT nasal spray Place 2 sprays into  both nostrils every morning.   levonorgestrel  (MIRENA ) 20 MCG/24HR IUD 1 each by Intrauterine route once.   No current facility-administered medications on file prior to visit.    Allergies:  Allergies  Allergen Reactions   Elemental Sulfur Hives   Sulfa Antibiotics Hives    Social History:  Social History   Socioeconomic History   Marital status: Married    Spouse name: Not on file   Number of children: Not on file   Years of education: Not on file   Highest education level: Not on file  Occupational History   Not on file  Tobacco Use   Smoking status: Never    Passive exposure: Never   Smokeless tobacco: Never  Vaping Use   Vaping status: Never Used  Substance and Sexual Activity   Alcohol use: No    Alcohol/week: 0.0 standard drinks of alcohol   Drug use: No   Sexual activity: Yes    Birth control/protection: I.U.D.  Other Topics Concern   Not on file  Social History Narrative   Not on file   Social Drivers of Health   Financial Resource Strain: Not on file  Food Insecurity: Not on file  Transportation Needs: Not on file  Physical Activity: Not on file  Stress: Not on file  Social Connections: Not on file  Intimate Partner Violence: Not on file   Social History   Tobacco Use  Smoking Status Never   Passive exposure: Never  Smokeless Tobacco Never   Social History   Substance and Sexual Activity  Alcohol Use No   Alcohol/week: 0.0 standard drinks of alcohol    Family History:  Family History  Problem Relation Age of Onset   Hypertension Father    Hypertension Brother    Hypertension Paternal Grandmother    Hypertension Brother    Heart disease Mother     Past medical history, surgical history, medications, allergies, family history and social history reviewed with patient today and changes made to appropriate areas of the chart.   Review of Systems  Constitutional:        Weight gain  Eyes:  Negative for blurred vision and double  vision.  Respiratory:  Negative for shortness of breath.   Cardiovascular:  Negative for chest pain, palpitations and leg swelling.  Neurological:  Negative for dizziness and headaches.   All other ROS negative except what is listed above and in the HPI.      Objective:    BP 130/69   Pulse 65   Temp 97.7 F (36.5 C) (Oral)   Wt 253 lb 3.2 oz (114.9 kg)   SpO2 98%   BMI 43.46 kg/m   Wt Readings from Last  3 Encounters:  03/07/24 253 lb 3.2 oz (114.9 kg)  09/18/23 251 lb 5.2 oz (114 kg)  09/15/23 251 lb 6.4 oz (114 kg)    Physical Exam Vitals and nursing note reviewed.  Constitutional:      General: She is awake. She is not in acute distress.    Appearance: Normal appearance. She is well-developed. She is obese. She is not ill-appearing.  HENT:     Head: Normocephalic and atraumatic.     Right Ear: Hearing, tympanic membrane, ear canal and external ear normal. No drainage.     Left Ear: Hearing, tympanic membrane, ear canal and external ear normal. No drainage.     Nose: Nose normal.     Right Sinus: No maxillary sinus tenderness or frontal sinus tenderness.     Left Sinus: No maxillary sinus tenderness or frontal sinus tenderness.     Mouth/Throat:     Mouth: Mucous membranes are moist.     Pharynx: Oropharynx is clear. Uvula midline. No pharyngeal swelling, oropharyngeal exudate or posterior oropharyngeal erythema.  Eyes:     General: Lids are normal.        Right eye: No discharge.        Left eye: No discharge.     Extraocular Movements: Extraocular movements intact.     Conjunctiva/sclera: Conjunctivae normal.     Pupils: Pupils are equal, round, and reactive to light.     Visual Fields: Right eye visual fields normal and left eye visual fields normal.  Neck:     Thyroid: No thyromegaly.     Vascular: No carotid bruit.     Trachea: Trachea normal.  Cardiovascular:     Rate and Rhythm: Normal rate and regular rhythm.     Heart sounds: Normal heart sounds. No  murmur heard.    No gallop.  Pulmonary:     Effort: Pulmonary effort is normal. No accessory muscle usage or respiratory distress.     Breath sounds: Normal breath sounds.  Chest:  Breasts:    Right: Normal.     Left: Normal.  Abdominal:     General: Bowel sounds are normal.     Palpations: Abdomen is soft. There is no hepatomegaly or splenomegaly.     Tenderness: There is no abdominal tenderness.  Musculoskeletal:        General: Normal range of motion.     Cervical back: Normal range of motion and neck supple.     Right lower leg: No edema.     Left lower leg: No edema.  Lymphadenopathy:     Head:     Right side of head: No submental, submandibular, tonsillar, preauricular or posterior auricular adenopathy.     Left side of head: No submental, submandibular, tonsillar, preauricular or posterior auricular adenopathy.     Cervical: No cervical adenopathy.     Upper Body:     Right upper body: No supraclavicular, axillary or pectoral adenopathy.     Left upper body: No supraclavicular, axillary or pectoral adenopathy.  Skin:    General: Skin is warm and dry.     Capillary Refill: Capillary refill takes less than 2 seconds.     Findings: No rash.  Neurological:     Mental Status: She is alert and oriented to person, place, and time.     Gait: Gait is intact.     Deep Tendon Reflexes: Reflexes are normal and symmetric.     Reflex Scores:      Brachioradialis reflexes  are 2+ on the right side and 2+ on the left side.      Patellar reflexes are 2+ on the right side and 2+ on the left side. Psychiatric:        Attention and Perception: Attention normal.        Mood and Affect: Mood normal.        Speech: Speech normal.        Behavior: Behavior normal. Behavior is cooperative.        Thought Content: Thought content normal.        Judgment: Judgment normal.     Results for orders placed or performed in visit on 03/07/24  Hemoglobin A1c   Collection Time: 03/07/24  4:06 PM   Result Value Ref Range   Hgb A1c MFr Bld 6.5 (H) 4.8 - 5.6 %   Est. average glucose Bld gHb Est-mCnc 140 mg/dL  CBC with Differential/Platelet   Collection Time: 03/07/24  4:06 PM  Result Value Ref Range   WBC 11.1 (H) 3.4 - 10.8 x10E3/uL   RBC 4.19 3.77 - 5.28 x10E6/uL   Hemoglobin 12.8 11.1 - 15.9 g/dL   Hematocrit 40.9 81.1 - 46.6 %   MCV 91 79 - 97 fL   MCH 30.5 26.6 - 33.0 pg   MCHC 33.5 31.5 - 35.7 g/dL   RDW 91.4 78.2 - 95.6 %   Platelets 351 150 - 450 x10E3/uL   Neutrophils 65 Not Estab. %   Lymphs 22 Not Estab. %   Monocytes 8 Not Estab. %   Eos 3 Not Estab. %   Basos 1 Not Estab. %   Neutrophils Absolute 7.3 (H) 1.4 - 7.0 x10E3/uL   Lymphocytes Absolute 2.5 0.7 - 3.1 x10E3/uL   Monocytes Absolute 0.9 0.1 - 0.9 x10E3/uL   EOS (ABSOLUTE) 0.3 0.0 - 0.4 x10E3/uL   Basophils Absolute 0.1 0.0 - 0.2 x10E3/uL   Immature Granulocytes 1 Not Estab. %   Immature Grans (Abs) 0.1 0.0 - 0.1 x10E3/uL  Comprehensive metabolic panel with GFR   Collection Time: 03/07/24  4:06 PM  Result Value Ref Range   Glucose 105 (H) 70 - 99 mg/dL   BUN 15 6 - 24 mg/dL   Creatinine, Ser 2.13 0.57 - 1.00 mg/dL   eGFR 086 >57 QI/ONG/2.95   BUN/Creatinine Ratio 22 9 - 23   Sodium 134 134 - 144 mmol/L   Potassium 4.6 3.5 - 5.2 mmol/L   Chloride 100 96 - 106 mmol/L   CO2 20 20 - 29 mmol/L   Calcium 9.1 8.7 - 10.2 mg/dL   Total Protein 6.7 6.0 - 8.5 g/dL   Albumin 3.8 3.8 - 4.9 g/dL   Globulin, Total 2.9 1.5 - 4.5 g/dL   Bilirubin Total 0.2 0.0 - 1.2 mg/dL   Alkaline Phosphatase 87 44 - 121 IU/L   AST 21 0 - 40 IU/L   ALT 23 0 - 32 IU/L  Lipid panel   Collection Time: 03/07/24  4:06 PM  Result Value Ref Range   Cholesterol, Total 166 100 - 199 mg/dL   Triglycerides 284 (H) 0 - 149 mg/dL   HDL 58 >13 mg/dL   VLDL Cholesterol Cal 33 5 - 40 mg/dL   LDL Chol Calc (NIH) 75 0 - 99 mg/dL   Chol/HDL Ratio 2.9 0.0 - 4.4 ratio  TSH   Collection Time: 03/07/24  4:06 PM  Result Value Ref Range    TSH 3.660 0.450 - 4.500 uIU/mL  Assessment & Plan:   Problem List Items Addressed This Visit       Cardiovascular and Mediastinum   Essential hypertension   Chronic. Stable. Controlled.  Continue with current medication regimen on Amlodipine  5mg , Atenolol  25mg , and Olmesartan  40mg . Refills sent today.  Labs ordered.  Will benefit from nutrition modification and physical activity. Return in 6 months.       Relevant Medications   amLODipine  (NORVASC ) 5 MG tablet   atenolol  (TENORMIN ) 25 MG tablet   olmesartan  (BENICAR ) 40 MG tablet     Other   Obesity, Class III, BMI 40-49.9 (morbid obesity)   Recommended eating smaller high protein, low fat meals more frequently and exercising 30 mins a day 5 times a week with a goal of 10-15lb weight loss in the next 3 months.       Hypercholesterolemia   Chronic. Stable. Lipid panel done today. ASCVD risk 2.4% which is considered low risk. Will benefit from nutrition modification and physical activity. Return in 6 months.       Relevant Medications   amLODipine  (NORVASC ) 5 MG tablet   atenolol  (TENORMIN ) 25 MG tablet   olmesartan  (BENICAR ) 40 MG tablet   Other Relevant Orders   Lipid panel (Completed)   Elevated hemoglobin A1c   Labs ordered at visit today.  Will make recommendations based on lab results.        Relevant Orders   Hemoglobin A1c (Completed)   Other Visit Diagnoses       Annual physical exam    -  Primary   Health maintenance reviewed during visit today.  Labs ordered.  Vaccines reviewed.  PAP up to date.  Mammogram ordered.  Colonoscopy up to date.   Relevant Orders   Hemoglobin A1c (Completed)   CBC with Differential/Platelet (Completed)   Comprehensive metabolic panel with GFR (Completed)   Lipid panel (Completed)   TSH (Completed)     Right foot pain       Relevant Orders   DG Foot Complete Right (Completed)     Encounter for screening mammogram for malignant neoplasm of breast       Relevant Orders   MM  3D SCREENING MAMMOGRAM BILATERAL BREAST         Follow up plan: Return in about 6 months (around 09/06/2024) for HTN, HLD, DM2 FU.   LABORATORY TESTING:  - Pap smear: up to date  IMMUNIZATIONS:   - Tdap: Tetanus vaccination status reviewed: last tetanus booster within 10 years. - Influenza: Up to date - Pneumovax: Not applicable - Prevnar: Not applicable - HPV: Not applicable - Zostavax vaccine: Not applicable  SCREENING: -Mammogram: Ordered today  - Colonoscopy: Up to date  - Bone Density: Not applicable  -Hearing Test: Not applicable  -Spirometry: Not applicable   PATIENT COUNSELING:   Advised to take 1 mg of folate supplement per day if capable of pregnancy.   Sexuality: Discussed sexually transmitted diseases, partner selection, use of condoms, avoidance of unintended pregnancy  and contraceptive alternatives.   Advised to avoid cigarette smoking.  I discussed with the patient that most people either abstain from alcohol or drink within safe limits (<=14/week and <=4 drinks/occasion for males, <=7/weeks and <= 3 drinks/occasion for females) and that the risk for alcohol disorders and other health effects rises proportionally with the number of drinks per week and how often a drinker exceeds daily limits.  Discussed cessation/primary prevention of drug use and availability of treatment for abuse.   Diet: Encouraged to  adjust caloric intake to maintain  or achieve ideal body weight, to reduce intake of dietary saturated fat and total fat, to limit sodium intake by avoiding high sodium foods and not adding table salt, and to maintain adequate dietary potassium and calcium preferably from fresh fruits, vegetables, and low-fat dairy products.    stressed the importance of regular exercise  Injury prevention: Discussed safety belts, safety helmets, smoke detector, smoking near bedding or upholstery.   Dental health: Discussed importance of regular tooth brushing, flossing,  and dental visits.    NEXT PREVENTATIVE PHYSICAL DUE IN 1 YEAR. Return in about 6 months (around 09/06/2024) for HTN, HLD, DM2 FU.

## 2024-03-08 LAB — COMPREHENSIVE METABOLIC PANEL WITH GFR
ALT: 23 IU/L (ref 0–32)
AST: 21 IU/L (ref 0–40)
Albumin: 3.8 g/dL (ref 3.8–4.9)
Alkaline Phosphatase: 87 IU/L (ref 44–121)
BUN/Creatinine Ratio: 22 (ref 9–23)
BUN: 15 mg/dL (ref 6–24)
Bilirubin Total: 0.2 mg/dL (ref 0.0–1.2)
CO2: 20 mmol/L (ref 20–29)
Calcium: 9.1 mg/dL (ref 8.7–10.2)
Chloride: 100 mmol/L (ref 96–106)
Creatinine, Ser: 0.69 mg/dL (ref 0.57–1.00)
Globulin, Total: 2.9 g/dL (ref 1.5–4.5)
Glucose: 105 mg/dL — ABNORMAL HIGH (ref 70–99)
Potassium: 4.6 mmol/L (ref 3.5–5.2)
Sodium: 134 mmol/L (ref 134–144)
Total Protein: 6.7 g/dL (ref 6.0–8.5)
eGFR: 103 mL/min/{1.73_m2} (ref >59)

## 2024-03-08 LAB — CBC WITH DIFFERENTIAL/PLATELET
Basophils Absolute: 0.1 10*3/uL (ref 0.0–0.2)
Basos: 1 %
EOS (ABSOLUTE): 0.3 10*3/uL (ref 0.0–0.4)
Eos: 3 %
Hematocrit: 38.2 % (ref 34.0–46.6)
Hemoglobin: 12.8 g/dL (ref 11.1–15.9)
Immature Grans (Abs): 0.1 10*3/uL (ref 0.0–0.1)
Immature Granulocytes: 1 %
Lymphocytes Absolute: 2.5 10*3/uL (ref 0.7–3.1)
Lymphs: 22 %
MCH: 30.5 pg (ref 26.6–33.0)
MCHC: 33.5 g/dL (ref 31.5–35.7)
MCV: 91 fL (ref 79–97)
Monocytes Absolute: 0.9 10*3/uL (ref 0.1–0.9)
Monocytes: 8 %
Neutrophils Absolute: 7.3 10*3/uL — ABNORMAL HIGH (ref 1.4–7.0)
Neutrophils: 65 %
Platelets: 351 10*3/uL (ref 150–450)
RBC: 4.19 x10E6/uL (ref 3.77–5.28)
RDW: 12.9 % (ref 11.7–15.4)
WBC: 11.1 10*3/uL — ABNORMAL HIGH (ref 3.4–10.8)

## 2024-03-08 LAB — LIPID PANEL
Chol/HDL Ratio: 2.9 ratio (ref 0.0–4.4)
Cholesterol, Total: 166 mg/dL (ref 100–199)
HDL: 58 mg/dL (ref >39)
LDL Chol Calc (NIH): 75 mg/dL (ref 0–99)
Triglycerides: 202 mg/dL — ABNORMAL HIGH (ref 0–149)
VLDL Cholesterol Cal: 33 mg/dL (ref 5–40)

## 2024-03-08 LAB — HEMOGLOBIN A1C
Est. average glucose Bld gHb Est-mCnc: 140 mg/dL
Hgb A1c MFr Bld: 6.5 % — ABNORMAL HIGH (ref 4.8–5.6)

## 2024-03-08 LAB — TSH: TSH: 3.66 u[IU]/mL (ref 0.450–4.500)

## 2024-03-09 ENCOUNTER — Ambulatory Visit: Payer: Self-pay | Admitting: Nurse Practitioner

## 2024-03-09 DIAGNOSIS — M7730 Calcaneal spur, unspecified foot: Secondary | ICD-10-CM

## 2024-03-09 NOTE — Assessment & Plan Note (Signed)
 Labs ordered at visit today.  Will make recommendations based on lab results.

## 2024-03-09 NOTE — Assessment & Plan Note (Signed)
 Chronic. Stable. Controlled.  Continue with current medication regimen on Amlodipine  5mg , Atenolol  25mg , and Olmesartan  40mg . Refills sent today.  Labs ordered.  Will benefit from nutrition modification and physical activity. Return in 6 months.

## 2024-03-09 NOTE — Assessment & Plan Note (Signed)
 Recommended eating smaller high protein, low fat meals more frequently and exercising 30 mins a day 5 times a week with a goal of 10-15lb weight loss in the next 3 months.

## 2024-03-09 NOTE — Assessment & Plan Note (Signed)
Chronic. Stable. Lipid panel done today. ASCVD risk 2.4% which is considered low risk. Will benefit from nutrition modification and physical activity. Return in 6 months.

## 2024-03-11 ENCOUNTER — Ambulatory Visit: Payer: Self-pay

## 2024-03-11 ENCOUNTER — Encounter: Payer: Self-pay | Admitting: Emergency Medicine

## 2024-03-11 ENCOUNTER — Ambulatory Visit
Admission: EM | Admit: 2024-03-11 | Discharge: 2024-03-11 | Disposition: A | Discharge status: Home / Self Care | Attending: Emergency Medicine | Admitting: Emergency Medicine

## 2024-03-11 DIAGNOSIS — R350 Frequency of micturition: Secondary | ICD-10-CM

## 2024-03-11 DIAGNOSIS — N390 Urinary tract infection, site not specified: Secondary | ICD-10-CM | POA: Diagnosis present

## 2024-03-11 DIAGNOSIS — R030 Elevated blood-pressure reading, without diagnosis of hypertension: Secondary | ICD-10-CM

## 2024-03-11 DIAGNOSIS — R300 Dysuria: Secondary | ICD-10-CM

## 2024-03-11 DIAGNOSIS — R319 Hematuria, unspecified: Secondary | ICD-10-CM | POA: Diagnosis present

## 2024-03-11 LAB — URINALYSIS, W/ REFLEX TO CULTURE (INFECTION SUSPECTED)
Bilirubin Urine: NEGATIVE
Glucose, UA: NEGATIVE mg/dL
Nitrite: NEGATIVE
Protein, ur: NEGATIVE mg/dL
Specific Gravity, Urine: 1.025 (ref 1.005–1.030)
pH: 5.5 (ref 5.0–8.0)

## 2024-03-11 MED ORDER — CEPHALEXIN 500 MG PO CAPS: 500.0000 mg | ORAL_CAPSULE | Freq: Two times a day (BID) | ORAL | 0 refills | Status: AC

## 2024-03-11 NOTE — ED Triage Notes (Signed)
 Patient c/o dysuria and urinary frequency that started 2 days ago.  Patient noticed some blood in her urine this morning.  Patient unsure of fevers

## 2024-03-11 NOTE — Telephone Encounter (Signed)
 FYI Only or Action Required?: FYI only for provider  Patient was last seen in primary care on 03/07/2024 by Aileen Alexanders, NP. Called Nurse Triage reporting Hematuria and Urinary Frequency. Symptoms began today. Interventions attempted: OTC medications: ibuprofen. Symptoms are: gradually worsening.  Triage Disposition: See Physician Within 24 Hours                 Patient/caregiver understands and will follow disposition?: Copied from CRM [id number]. Topic: Clinical - Red Word Triage >> Mar 11, 2024  1:09 PM Star [place] wrote: Red Word that prompted transfer to Nurse Triage: blood in urine, frequency and pain in bladder Reason for Disposition  Blood in urine (red, pink, or tea-colored)  Answer Assessment - Initial Assessment Questions 1. COLOR of URINE: Describe the color of the urine.  (e.g., tea-colored, pink, red, bloody) Do you have blood clots in your urine? (e.g., none, pea, grape, small coin)     Tea colored 2. ONSET: When did the bleeding start?      today 3. EPISODES: How many times has there been blood in the urine? or How many times today?     2 times 4. PAIN with URINATION: Is there any pain with passing your urine? If Yes, ask: How bad is the pain?  (Scale 1-10; or mild, moderate, severe)    - MILD: Complains slightly about urination hurting.    - MODERATE: Interferes with normal activities.      - SEVERE: Excruciating, unwilling or unable to urinate because of the pain.      no 5. FEVER: Do you have a fever? If Yes, ask: What is your temperature, how was it measured, and when did it start?     no 6. ASSOCIATED SYMPTOMS: Are you passing urine more frequently than usual?     yes 7. OTHER SYMPTOMS: Do you have any other symptoms? (e.g., back/flank pain, abdomen pain, vomiting)     Bladder pain  Answer Assessment - Initial Assessment Questions 1. SEVERITY: How bad is the pain?  (e.g., Scale 1-10; mild, moderate, or severe)   - MILD  (1-3): complains slightly about urination hurting   - MODERATE (4-7): interferes with normal activities     - SEVERE (8-10): excruciating, unwilling or unable to urinate because of the pain      3-4/10 2. FREQUENCY: How many times have you had painful urination today?      no 3. PATTERN: Is pain present every time you urinate or just sometimes?      Bladder pain 4. ONSET: When did the painful urination start?      today 5. FEVER: Do you have a fever? If Yes, ask: What is your temperature, how was it measured, and when did it start?     no 6. PAST UTI: Have you had a urine infection before? If Yes, ask: When was the last time? and What happened that time?      A few months 7. CAUSE: What do you think is causing the painful urination?  (e.g., UTI, scratch, Herpes sore)     uti 8. OTHER SYMPTOMS: Do you have any other symptoms? (e.g., blood in urine, flank pain, genital sores, urgency, vaginal discharge)     Blood in urine  Protocols used: Urine - Blood In-A-AH, Urination Pain - Female-A-AH

## 2024-03-11 NOTE — ED Provider Notes (Signed)
 [facility]    CSN: [id number] Arrival date & time: 03/11/24  1341      History   Chief Complaint Chief Complaint  Patient presents with   Dysuria   Urinary Frequency    HPI Victoria Bowers is a 53 y.o. female.   53 year old female, Victoria Bowers, presents to urgent care for evaluation of dysuria and urinary frequency x 2 days.  Patient states she thinks she noticed a tinge of blood in her urine this morning.  Patient denies any fever or back pain, denies nausea vomiting.  Pt reports hx of UTI's none in last month.  The history is provided by the patient. No language interpreter was used.    Past Medical History:  Diagnosis Date   Allergy    Bertolotti's syndrome    Complication of anesthesia    Fatty liver    Fibroid    GERD (gastroesophageal reflux disease)    OCC   Hypertension    Ovarian cyst    PONV (postoperative nausea and vomiting)    NAUSEATED    Patient Active Problem List   Diagnosis Date Noted   Dysuria 03/11/2024   Urinary frequency 03/11/2024   Elevated blood pressure reading 03/11/2024   Elevated hemoglobin A1c 11/18/2022   Hypercholesterolemia 02/11/2021   Leukocytosis    Obesity, Class III, BMI 40-49.9 (morbid obesity)    Acute diverticulitis 09/09/2020   Urinary tract infection with hematuria 09/09/2020   Disorder of skin of trunk 03/18/2018   Displacement of lumbar intervertebral disc without myelopathy 03/18/2018   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   Low back pain 03/18/2018   Vitamin D  deficiency 05/15/2017   DUB (dysfunctional uterine bleeding) 03/21/2016   Left ovarian cyst 03/19/2016   Abnormal bleeding in menstrual cycle 03/10/2016   Left elbow tendonitis 11/28/2015   Fatty liver 07/16/2015   Migraine 07/16/2015   Essential hypertension 03/09/2015    Past Surgical History:  Procedure Laterality Date   CERVICAL POLYPECTOMY     CHOLECYSTECTOMY     COLONOSCOPY WITH PROPOFOL  N/A 11/26/2020    Procedure: COLONOSCOPY WITH PROPOFOL ;  Surgeon: Luke Salaam, MD;  Location: [facility] ENDOSCOPY;  Service: Gastroenterology;  Laterality: N/A;   COLONOSCOPY WITH PROPOFOL  N/A 12/17/2020   Procedure: COLONOSCOPY WITH PROPOFOL ;  Surgeon: Luke Salaam, MD;  Location: [facility] ENDOSCOPY;  Service: Gastroenterology;  Laterality: N/A;   HYSTEROSCOPY WITH D & C N/A 03/28/2019   Procedure: DILATATION AND CURETTAGE /HYSTEROSCOPY;  Surgeon: Zenobia Hila, MD;  Location: [facility] ORS;  Service: Gynecology;  Laterality: N/A;   INTRAUTERINE DEVICE (IUD) INSERTION N/A 03/28/2019   Procedure: INTRAUTERINE DEVICE (IUD) INSERTION;  Surgeon: Zenobia Hila, MD;  Location: [facility] ORS;  Service: Gynecology;  Laterality: N/A;   NASAL SINUS SURGERY  X2   SPINE SURGERY  ruptured disc   LUMBAR   TONSILLECTOMY     TONSILLECTOMY AND ADENOIDECTOMY  1985    OB History     Gravida  0   Para  0   Term  0   Preterm  0   AB  0   Living  0      SAB  0   IAB  0   Ectopic  0   Multiple  0   Live Births  0            Home Medications    Prior to Admission medications   Medication Sig Start Date End Date Taking? Authorizing Provider  cephALEXin (KEFLEX) 500 MG capsule  Take 1 capsule (500 mg total) by mouth 2 (two) times daily for 5 days. 03/11/24 03/16/24 Yes Oria Klimas, Eveleen Hinds, NP  amLODipine  (NORVASC ) 5 MG tablet Take 1 tablet (5 mg total) by mouth daily. 03/07/24   Aileen Alexanders, NP  atenolol  (TENORMIN ) 25 MG tablet Take 1 tablet (25 mg total) by mouth daily. 03/07/24   Aileen Alexanders, NP  estradiol  (ESTRACE ) 1 MG tablet TAKE 1 TABLET BY MOUTH EVERY DAY 01/20/24   Zenobia Hila, MD  fluticasone  (FLONASE ) 50 MCG/ACT nasal spray Place 2 sprays into both nostrils every morning.    [provider]  levonorgestrel  (MIRENA ) 20 MCG/24HR IUD 1 each by Intrauterine route once.    [provider]  olmesartan  (BENICAR ) 40 MG tablet Take 1 tablet (40 mg total) by mouth daily. 03/07/24    Aileen Alexanders, NP    Family History Family History  Problem Relation Age of Onset   Hypertension Father    Hypertension Brother    Hypertension Paternal Grandmother    Hypertension Brother    Heart disease Mother     Social History Social History   Tobacco Use   Smoking status: Never    Passive exposure: Never   Smokeless tobacco: Never  Vaping Use   Vaping status: Never Used  Substance Use Topics   Alcohol use: No    Alcohol/week: 0.0 standard drinks of alcohol   Drug use: No     Allergies   Elemental sulfur and Sulfa antibiotics   Review of Systems Review of Systems  Constitutional:  Negative for fever.  Gastrointestinal:  Negative for abdominal pain, nausea and vomiting.  Genitourinary:  Positive for dysuria, frequency and hematuria.  Musculoskeletal:  Negative for back pain.  All other systems reviewed and are negative.    Physical Exam Triage Vital Signs ED Triage Vitals  Encounter Vitals Group     BP      Girls Systolic BP Percentile      Girls Diastolic BP Percentile      Boys Systolic BP Percentile      Boys Diastolic BP Percentile      Pulse      Resp      Temp      Temp src      SpO2      Weight      Height      Head Circumference      Peak Flow      Pain Score      Pain Loc      Pain Education      Exclude from Growth Chart    No data found.  Updated Vital Signs BP (!) 149/83 (BP Location: Left Arm)   Pulse 61   Temp 98.6 F (37 C) (Oral)   Resp 15   Ht 5' 4 (1.626 m)   Wt 253 lb 4.9 oz (114.9 kg)   SpO2 99%   BMI 43.48 kg/m   Visual Acuity Right Eye Distance:   Left Eye Distance:   Bilateral Distance:    Right Eye Near:   Left Eye Near:    Bilateral Near:     Physical Exam Vitals and nursing note reviewed.  Constitutional:      General: She is not in acute distress.    Appearance: Normal appearance. She is well-developed and well-groomed.  HENT:     Head: Normocephalic.   Eyes:     Pupils: Pupils are  equal, round, and reactive to light.  Cardiovascular:     Rate and Rhythm: Normal rate and regular rhythm.  Pulmonary:     Effort: Pulmonary effort is normal.  Abdominal:     General: Bowel sounds are normal.     Tenderness: There is abdominal tenderness in the suprapubic area.   Musculoskeletal:        General: Normal range of motion.     Cervical back: Normal range of motion.   Skin:    General: Skin is warm and dry.   Neurological:     General: No focal deficit present.     Mental Status: She is alert and oriented to person, place, and time.     GCS: GCS eye subscore is 4. GCS verbal subscore is 5. GCS motor subscore is 6.   Psychiatric:        Speech: Speech normal.        Behavior: Behavior normal. Behavior is cooperative.      UC Treatments / Results  Labs (all labs ordered are listed, but only abnormal results are displayed) Labs Reviewed  URINALYSIS, W/ REFLEX TO CULTURE (INFECTION SUSPECTED) - Abnormal; Notable for the following components:      Result Value   APPearance HAZY (*)    Hgb urine dipstick MODERATE (*)    Ketones, ur TRACE (*)    Leukocytes,Ua SMALL (*)    Bacteria, UA FEW (*)    All other components within normal limits  URINE CULTURE    EKG   Radiology No results found.  Procedures Procedures (including critical care time)  Medications Ordered in UC Medications - No data to display  Initial Impression / Assessment and Plan / UC Course  I have reviewed the triage vital signs and the nursing notes.  Pertinent labs & imaging results that were available during my care of the patient were reviewed by me and considered in my medical decision making (see chart for details).  Clinical Course as of 03/11/24 1641  Fri Mar 11, 2024  1417 UA ordered by staff prior to evaluation by this provider [JD]    Clinical Course User Index [JD] Saphia Vanderford, Eveleen Hinds, NP  Discussed exam findings and plan of care with patient, will prescription Keflex,  urine culture pending, if antibiotic needs to be changed patient will be notified.  Strict go to ER precautions given, patient verbalized understanding to this provider.  Ddx: UTI w hematuria, dysuria, urinary frequency, pyelonephritis, elevated BP Final Clinical Impressions(s) / UC Diagnoses   Final diagnoses:  Urinary tract infection with hematuria, site unspecified  Dysuria  Urinary frequency  Elevated blood pressure reading     Discharge Instructions      You urine is positive for UTI. Take antibiotic as directed. Drink plenty of water, follow up with PCP.   Go to Er for new or worsening issues or concerns(fever, nausea, vomiting, unable to keep meds down, muscle aches, etc)     ED Prescriptions     Medication Sig Dispense Auth. Provider   cephALEXin (KEFLEX) 500 MG capsule Take 1 capsule (500 mg total) by mouth 2 (two) times daily for 5 days. 10 capsule Arias Weinert, NP      PDMP not reviewed this encounter.   Peter [employer]ands, NP 03/11/24 1642

## 2024-03-11 NOTE — Discharge Instructions (Addendum)
You urine is positive for UTI. Take antibiotic as directed. Drink plenty of water, follow up with PCP.   Go to Er for new or worsening issues or concerns(fever, nausea, vomiting, unable to keep meds down, muscle aches, etc)

## 2024-03-12 LAB — URINE CULTURE: Special Requests: NORMAL

## 2024-03-14 ENCOUNTER — Ambulatory Visit (HOSPITAL_COMMUNITY): Payer: Self-pay

## 2024-04-16 ENCOUNTER — Other Ambulatory Visit: Payer: Self-pay | Admitting: Obstetrics and Gynecology

## 2024-04-16 DIAGNOSIS — N951 Menopausal and female climacteric states: Secondary | ICD-10-CM

## 2024-04-19 ENCOUNTER — Encounter: Payer: Self-pay | Admitting: Obstetrics and Gynecology

## 2024-04-19 ENCOUNTER — Ambulatory Visit: Admitting: Obstetrics and Gynecology

## 2024-04-19 VITALS — BP 123/80 | HR 61 | Ht 64.0 in | Wt 255.3 lb

## 2024-04-19 DIAGNOSIS — Z01419 Encounter for gynecological examination (general) (routine) without abnormal findings: Secondary | ICD-10-CM | POA: Diagnosis not present

## 2024-04-19 DIAGNOSIS — N951 Menopausal and female climacteric states: Secondary | ICD-10-CM

## 2024-04-19 DIAGNOSIS — Z79890 Hormone replacement therapy (postmenopausal): Secondary | ICD-10-CM | POA: Diagnosis not present

## 2024-04-19 DIAGNOSIS — Z1231 Encounter for screening mammogram for malignant neoplasm of breast: Secondary | ICD-10-CM

## 2024-04-19 MED ORDER — ESTRADIOL 1 MG PO TABS: 1.0000 mg | ORAL_TABLET | Freq: Every day | ORAL | 3 refills | Status: AC

## 2024-04-19 NOTE — Progress Notes (Signed)
 Patients presents for annual exam today. She states doing well with current HRT, refilled. Due for mammogram, ordered. Up to date on pap smear. Annual labs are up to date. She states no other questions or concerns at this time.

## 2024-04-19 NOTE — Progress Notes (Signed)
 HPI:      Victoria Bowers is a 53 y.o. G0P0000 who LMP was No LMP recorded. (Menstrual status: IUD).  Subjective:   She presents today for her annual examination.  She has no complaints.  She remains very happy with her estrogen.  She would like to continue.  She has an IUD (Mirena ) in place. She is excited about going to [place]  where her husband and son will shoot in a torment for rifle.    Hx: The following portions of the patient's history were reviewed and updated as appropriate:             She  has a past medical history of Allergy, Bertolotti's syndrome, Complication of anesthesia, Fatty liver, Fibroid, GERD (gastroesophageal reflux disease), Hypertension, Ovarian cyst, and PONV (postoperative nausea and vomiting). She does not have any pertinent problems on file. She  has a past surgical history that includes Cholecystectomy; Spine surgery (ruptured disc); Nasal sinus surgery (X2); Tonsillectomy and adenoidectomy (1985); Hysteroscopy with D & C (N/A, 03/28/2019); Intrauterine device (iud) insertion (N/A, 03/28/2019); Cervical polypectomy; Tonsillectomy; Colonoscopy with propofol  (N/A, 11/26/2020); and Colonoscopy with propofol  (N/A, 12/17/2020). Her family history includes Heart disease in her mother; Hypertension in her brother, brother, father, and paternal grandmother. She  reports that she has never smoked. She has never been exposed to tobacco smoke. She has never used smokeless tobacco. She reports that she does not drink alcohol and does not use drugs. She has a current medication list which includes the following prescription(s): amlodipine , atenolol , estradiol , fluticasone , levonorgestrel , and olmesartan . She is allergic to elemental sulfur and sulfa antibiotics.       Review of Systems:  Review of Systems  Constitutional: Denied constitutional symptoms, night sweats, recent illness, fatigue, fever, insomnia and weight loss.  Eyes: Denied eye symptoms, eye pain, photophobia,  vision change and visual disturbance.  Ears/Nose/Throat/Neck: Denied ear, nose, throat or neck symptoms, hearing loss, nasal discharge, sinus congestion and sore throat.  Cardiovascular: Denied cardiovascular symptoms, arrhythmia, chest pain/pressure, edema, exercise intolerance, orthopnea and palpitations.  Respiratory: Denied pulmonary symptoms, asthma, pleuritic pain, productive sputum, cough, dyspnea and wheezing.  Gastrointestinal: Denied, gastro-esophageal reflux, melena, nausea and vomiting.  Genitourinary: Denied genitourinary symptoms including symptomatic vaginal discharge, pelvic relaxation issues, and urinary complaints.  Musculoskeletal: Denied musculoskeletal symptoms, stiffness, swelling, muscle weakness and myalgia.  Dermatologic: Denied dermatology symptoms, rash and scar.  Neurologic: Denied neurology symptoms, dizziness, headache, neck pain and syncope.  Psychiatric: Denied psychiatric symptoms, anxiety and depression.  Endocrine: Denied endocrine symptoms including hot flashes and night sweats.   Meds:   Current Outpatient Medications on File Prior to Visit  Medication Sig Dispense Refill   amLODipine  (NORVASC ) 5 MG tablet Take 1 tablet (5 mg total) by mouth daily. 90 tablet 1   atenolol  (TENORMIN ) 25 MG tablet Take 1 tablet (25 mg total) by mouth daily. 90 tablet 1   fluticasone  (FLONASE ) 50 MCG/ACT nasal spray Place 2 sprays into both nostrils every morning.     levonorgestrel  (MIRENA ) 20 MCG/24HR IUD 1 each by Intrauterine route once.     olmesartan  (BENICAR ) 40 MG tablet Take 1 tablet (40 mg total) by mouth daily. 90 tablet 1   No current facility-administered medications on file prior to visit.     Objective:     Vitals:   04/19/24 1047  BP: 123/80  Pulse: 61    Filed Weights   04/19/24 1047  Weight: 255 lb 4.8 oz (115.8 kg)  Patient has deferred examination as she has no complaints.  Assessment:    G0P0000 Patient Active Problem List    Diagnosis Date Noted   Dysuria 03/11/2024   Urinary frequency 03/11/2024   Elevated blood pressure reading 03/11/2024   Elevated hemoglobin A1c 11/18/2022   Hypercholesterolemia 02/11/2021   Leukocytosis    Obesity, Class III, BMI 40-49.9 (morbid obesity)    Acute diverticulitis 09/09/2020   Urinary tract infection with hematuria 09/09/2020   Disorder of skin of trunk 03/18/2018   Displacement of lumbar intervertebral disc without myelopathy 03/18/2018   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   Low back pain 03/18/2018   Vitamin D  deficiency 05/15/2017   DUB (dysfunctional uterine bleeding) 03/21/2016   Left ovarian cyst 03/19/2016   Abnormal bleeding in menstrual cycle 03/10/2016   Left elbow tendonitis 11/28/2015   Fatty liver 07/16/2015   Migraine 07/16/2015   Essential hypertension 03/09/2015     1. Well woman exam with routine gynecological exam   2. Hormone replacement therapy (HRT)   3. Menopausal symptoms   4. Screening mammogram for breast cancer        Plan:            1.  Basic Screening Recommendations The basic screening recommendations for asymptomatic women were discussed with the patient during her visit.  The age-appropriate recommendations were discussed with her and the rational for the tests reviewed.  When I am informed by the patient that another primary care physician has previously obtained the age-appropriate tests and they are up-to-date, only outstanding tests are ordered and referrals given as necessary.  Abnormal results of tests will be discussed with her when all of her results are completed.  Routine preventative health maintenance measures emphasized: Exercise/Diet/Weight control, Tobacco Warnings, Alcohol/Substance use risks and Stress Management Mammogram ordered 2.  Continue ERT. Orders Orders Placed This Encounter  Procedures   MM DIGITAL SCREENING BILATERAL     Meds ordered this encounter  Medications   estradiol   (ESTRACE ) 1 MG tablet    Sig: Take 1 tablet (1 mg total) by mouth daily.    Dispense:  90 tablet    Refill:  3            F/U  Return in about 1 year (around 04/19/2025) for Annual Physical.  Alm DOROTHA Sar, M.D. 04/19/2024 11:36 AM

## 2024-06-07 LAB — HM DIABETES EYE EXAM

## 2024-09-12 ENCOUNTER — Ambulatory Visit: Admitting: Nurse Practitioner

## 2024-09-12 ENCOUNTER — Encounter: Payer: Self-pay | Admitting: Nurse Practitioner

## 2024-09-12 VITALS — BP 126/71 | HR 61 | Temp 98.6°F | Ht 64.02 in | Wt 239.4 lb

## 2024-09-12 DIAGNOSIS — E1169 Type 2 diabetes mellitus with other specified complication: Secondary | ICD-10-CM

## 2024-09-12 DIAGNOSIS — Z1231 Encounter for screening mammogram for malignant neoplasm of breast: Secondary | ICD-10-CM

## 2024-09-12 DIAGNOSIS — I10 Essential (primary) hypertension: Secondary | ICD-10-CM | POA: Diagnosis not present

## 2024-09-12 DIAGNOSIS — E7800 Pure hypercholesterolemia, unspecified: Secondary | ICD-10-CM | POA: Diagnosis not present

## 2024-09-12 DIAGNOSIS — E66813 Obesity, class 3: Secondary | ICD-10-CM

## 2024-09-12 DIAGNOSIS — E119 Type 2 diabetes mellitus without complications: Secondary | ICD-10-CM | POA: Insufficient documentation

## 2024-09-12 LAB — MICROALBUMIN, URINE WAIVED
Creatinine, Urine Waived: 200 mg/dL (ref 10–300)
Microalb, Ur Waived: 30 mg/L — ABNORMAL HIGH (ref 0–19)
Microalb/Creat Ratio: <30 mg/g (ref <30)

## 2024-09-12 MED ORDER — OLMESARTAN MEDOXOMIL 40 MG PO TABS: 40.0000 mg | ORAL_TABLET | Freq: Every day | ORAL | 1 refills | Status: AC

## 2024-09-12 MED ORDER — ATENOLOL 25 MG PO TABS: 25.0000 mg | ORAL_TABLET | Freq: Every day | ORAL | 1 refills | Status: AC

## 2024-09-12 MED ORDER — AMLODIPINE BESYLATE 5 MG PO TABS: 5.0000 mg | ORAL_TABLET | Freq: Every day | ORAL | 1 refills | Status: AC

## 2024-09-12 NOTE — Progress Notes (Signed)
 BP 126/71 (BP Location: Right Wrist, Patient Position: Sitting, Cuff Size: Normal)   Pulse 61   Temp 98.6 F (37 C) (Oral)   Ht 5' 4.02 (1.626 m)   Wt 239 lb 6.4 oz (108.6 kg)   SpO2 99%   BMI 41.07 kg/m    Subjective:    Patient ID: Victoria Bowers, female    DOB: 07-14-1971, 53 y.o.   MRN: [id number]  HPI: Victoria Bowers is a 53 y.o. female  Chief Complaint  Patient presents with   office visit    6 month F/u    HYPERTENSION / HYPERLIPIDEMIA Satisfied with current treatment? no Duration of hypertension: years BP monitoring frequency:  BP range:  BP medication side effects: no Past BP meds: amlodipine , atenolol  and olmesartan  (benicar ) Duration of hyperlipidemia: years Cholesterol medication side effects: no Cholesterol supplements:none Past cholesterol medications: none Medication compliance: excellent compliance Aspirin: no Recent stressors: no Recurrent headaches: no Visual changes: no Palpitations: no Dyspnea: no Chest pain: no Lower extremity edema: no Dizzy/lightheaded: no  DIABETES Patient has lost 16lbs.  She has really changed her eating which hs helped her.  She is using an app to help track her food.  She does not want to start any medication.   Hypoglycemic episodes:no Polydipsia/polyuria: no Visual disturbance: no Chest pain: no Paresthesias: no Glucose Monitoring: no  Accucheck frequency: Not Checking  Fasting glucose:  Post prandial:  Evening:  Before meals: Taking Insulin?: no  Long acting insulin:  Short acting insulin: Blood Pressure Monitoring: not checking Retinal Examination: Not up to Date Foot Exam: Up to Date Diabetic Education: Not Completed Pneumovax: Up to Date Influenza: Up to Date Aspirin: no     Relevant past medical, surgical, family and social history reviewed and updated as indicated. Interim medical history since our last visit reviewed. Allergies and medications reviewed and updated.  Review of  Systems  Eyes:  Negative for visual disturbance.  Respiratory:  Negative for cough, chest tightness and shortness of breath.   Cardiovascular:  Negative for chest pain, palpitations and leg swelling.  Endocrine: Negative for polydipsia and polyuria.  Neurological:  Negative for dizziness, numbness and headaches.    Per HPI unless specifically indicated above     Objective:    BP 126/71 (BP Location: Right Wrist, Patient Position: Sitting, Cuff Size: Normal)   Pulse 61   Temp 98.6 F (37 C) (Oral)   Ht 5' 4.02 (1.626 m)   Wt 239 lb 6.4 oz (108.6 kg)   SpO2 99%   BMI 41.07 kg/m   Wt Readings from Last 3 Encounters:  09/12/24 239 lb 6.4 oz (108.6 kg)  04/19/24 255 lb 4.8 oz (115.8 kg)  03/11/24 253 lb 4.9 oz (114.9 kg)    Physical Exam Vitals and nursing note reviewed.  Constitutional:      General: She is not in acute distress.    Appearance: Normal appearance. She is obese. She is not ill-appearing, toxic-appearing or diaphoretic.  HENT:     Head: Normocephalic.     Right Ear: External ear normal.     Left Ear: External ear normal.     Nose: Nose normal.     Mouth/Throat:     Mouth: Mucous membranes are moist.     Pharynx: Oropharynx is clear.  Eyes:     General:        Right eye: No discharge.        Left eye: No discharge.     Extraocular  Movements: Extraocular movements intact.     Conjunctiva/sclera: Conjunctivae normal.     Pupils: Pupils are equal, round, and reactive to light.  Cardiovascular:     Rate and Rhythm: Normal rate and regular rhythm.     Heart sounds: No murmur heard. Pulmonary:     Effort: Pulmonary effort is normal. No respiratory distress.     Breath sounds: Normal breath sounds. No wheezing or rales.  Musculoskeletal:     Cervical back: Normal range of motion and neck supple.  Skin:    General: Skin is warm and dry.     Capillary Refill: Capillary refill takes less than 2 seconds.  Neurological:     General: No focal deficit present.      Mental Status: She is alert and oriented to person, place, and time. Mental status is at baseline.  Psychiatric:        Mood and Affect: Mood normal.        Behavior: Behavior normal.        Thought Content: Thought content normal.        Judgment: Judgment normal.     Results for orders placed or performed during the hospital encounter of 03/11/24  Urine Culture   Collection Time: 03/11/24  2:27 PM   Specimen: Urine, Clean Catch  Result Value Ref Range   Specimen Description      URINE, CLEAN CATCH Performed at [facility], [address], [place], [place] [address]    Special Requests      Normal Performed at [facility], [address] [place], [place] [address]    Culture MULTIPLE SPECIES PRESENT, SUGGEST RECOLLECTION (A)    Report Status 03/12/2024 FINAL   Urinalysis, w/ Reflex to Culture (Infection Suspected) -Urine, Clean Catch   Collection Time: 03/11/24  2:27 PM  Result Value Ref Range   Specimen Source URINE, CLEAN CATCH    Color, Urine YELLOW YELLOW   APPearance HAZY (A) CLEAR   Specific Gravity, Urine 1.025 1.005 - 1.030   pH 5.5 5.0 - 8.0   Glucose, UA NEGATIVE NEGATIVE mg/dL   Hgb urine dipstick MODERATE (A) NEGATIVE   Bilirubin Urine NEGATIVE NEGATIVE   Ketones, ur TRACE (A) NEGATIVE mg/dL   Protein, ur NEGATIVE NEGATIVE mg/dL   Nitrite NEGATIVE NEGATIVE   Leukocytes,Ua SMALL (A) NEGATIVE   Squamous Epithelial / HPF 6-10 0 - 5 /HPF   WBC, UA 0-5 0 - 5 WBC/hpf   RBC / HPF 21-50 0 - 5 RBC/hpf   Bacteria, UA FEW (A) NONE SEEN      Assessment & Plan:   Problem List Items Addressed This Visit       Cardiovascular and Mediastinum   Essential hypertension   Chronic. Stable. Controlled.  Continue with current medication regimen on Amlodipine  5mg , Atenolol  25mg , and Olmesartan  40mg . Refills sent today.  Labs ordered.  Will benefit from nutrition modification and physical activity. Return in 6 months.       Relevant  Medications   amLODipine  (NORVASC ) 5 MG tablet   atenolol  (TENORMIN ) 25 MG tablet   olmesartan  (BENICAR ) 40 MG tablet     Endocrine   Diabetes mellitus (HCC) - Primary   Chronic.  Controlled without medication. Has lost 16lbs since last visit.  Has significantly decreased her carbohydrate intake.  Would like to continue without medications at this time.  Labs ordered today.  Return to clinic in 6 months for reevaluation.  Call sooner if concerns arise.  Relevant Medications   olmesartan  (BENICAR ) 40 MG tablet   Other Relevant Orders   Comp Met (CMET)   HgB A1c   Microalbumin, Urine Waived     Other   Obesity, Class III, BMI 40-49.9 (morbid obesity) (HCC)   Recommended eating smaller high protein, low fat meals more frequently and exercising 30 mins a day 5 times a week with a goal of 10-15lb weight loss in the next 3 months.       Hypercholesterolemia   Chronic. Stable. Lipid panel done today. ASCVD risk 2.4% which is considered low risk. Continue with diet modifications to aid in weight loss.       Relevant Medications   amLODipine  (NORVASC ) 5 MG tablet   atenolol  (TENORMIN ) 25 MG tablet   olmesartan  (BENICAR ) 40 MG tablet   Other Relevant Orders   Lipid Profile   Other Visit Diagnoses       Encounter for screening mammogram for malignant neoplasm of breast       Relevant Orders   MM 3D SCREENING MAMMOGRAM BILATERAL BREAST        Follow up plan: Return in about 6 months (around 03/13/2025) for Physical and Fasting labs.

## 2024-09-12 NOTE — Assessment & Plan Note (Signed)
 Recommended eating smaller high protein, low fat meals more frequently and exercising 30 mins a day 5 times a week with a goal of 10-15lb weight loss in the next 3 months.

## 2024-09-12 NOTE — Patient Instructions (Signed)
 Please call to schedule your mammogram and/or bone density: [facility] at [facility]  Address: [address], [place], [place] [address] Phone: [phone]  [facility] [address] [place],  [place]  [address] Phone: [phone]

## 2024-09-12 NOTE — Assessment & Plan Note (Signed)
 Chronic.  Controlled without medication. Has lost 16lbs since last visit.  Has significantly decreased her carbohydrate intake.  Would like to continue without medications at this time.  Labs ordered today.  Return to clinic in 6 months for reevaluation.  Call sooner if concerns arise.

## 2024-09-12 NOTE — Assessment & Plan Note (Signed)
 Chronic. Stable. Lipid panel done today. ASCVD risk 2.4% which is considered low risk. Continue with diet modifications to aid in weight loss.

## 2024-09-12 NOTE — Assessment & Plan Note (Signed)
 Chronic. Stable. Controlled.  Continue with current medication regimen on Amlodipine  5mg , Atenolol  25mg , and Olmesartan  40mg . Refills sent today.  Labs ordered.  Will benefit from nutrition modification and physical activity. Return in 6 months.

## 2024-09-13 ENCOUNTER — Ambulatory Visit: Payer: Self-pay | Admitting: Nurse Practitioner

## 2024-09-13 LAB — HEMOGLOBIN A1C
Est. average glucose Bld gHb Est-mCnc: 143 mg/dL
Hgb A1c MFr Bld: 6.6 % — ABNORMAL HIGH (ref 4.8–5.6)

## 2024-09-13 LAB — LIPID PANEL
Chol/HDL Ratio: 3.3 ratio (ref 0.0–4.4)
Cholesterol, Total: 171 mg/dL (ref 100–199)
HDL: 52 mg/dL (ref >39)
LDL Chol Calc (NIH): 92 mg/dL (ref 0–99)
Triglycerides: 153 mg/dL — ABNORMAL HIGH (ref 0–149)
VLDL Cholesterol Cal: 27 mg/dL (ref 5–40)

## 2024-09-13 LAB — COMPREHENSIVE METABOLIC PANEL WITH GFR
ALT: 17 IU/L (ref 0–32)
AST: 19 IU/L (ref 0–40)
Albumin: 4 g/dL (ref 3.8–4.9)
Alkaline Phosphatase: 82 IU/L (ref 49–135)
BUN/Creatinine Ratio: 17 (ref 9–23)
BUN: 15 mg/dL (ref 6–24)
Bilirubin Total: 0.3 mg/dL (ref 0.0–1.2)
CO2: 20 mmol/L (ref 20–29)
Calcium: 9.4 mg/dL (ref 8.7–10.2)
Chloride: 100 mmol/L (ref 96–106)
Creatinine, Ser: 0.87 mg/dL (ref 0.57–1.00)
Globulin, Total: 2.6 g/dL (ref 1.5–4.5)
Glucose: 88 mg/dL (ref 70–99)
Potassium: 4.5 mmol/L (ref 3.5–5.2)
Sodium: 136 mmol/L (ref 134–144)
Total Protein: 6.6 g/dL (ref 6.0–8.5)
eGFR: 79 mL/min/1.73 (ref >59)

## 2024-09-15 ENCOUNTER — Encounter: Payer: Self-pay | Admitting: Nurse Practitioner

## 2025-03-14 ENCOUNTER — Encounter: Admitting: Nurse Practitioner
